# Patient Record
Sex: Male | Born: 1975 | Race: White | Hispanic: No | Marital: Married | Smoking: Current every day smoker
Health system: Southern US, Community
[De-identification: ages and names within clinical notes are randomized; demographics above are authoritative.]

## PROBLEM LIST (undated history)

## (undated) DIAGNOSIS — Z8719 Personal history of other diseases of the digestive system: Secondary | ICD-10-CM

## (undated) DIAGNOSIS — E119 Type 2 diabetes mellitus without complications: Secondary | ICD-10-CM

## (undated) HISTORY — PX: FACIAL FRACTURE SURGERY: SHX1570

---

## 2003-03-25 ENCOUNTER — Other Ambulatory Visit: Payer: Self-pay

## 2004-02-27 ENCOUNTER — Emergency Department: Payer: Self-pay | Admitting: Emergency Medicine

## 2005-01-30 ENCOUNTER — Emergency Department: Payer: Self-pay | Admitting: Internal Medicine

## 2005-06-18 ENCOUNTER — Emergency Department: Payer: Self-pay | Admitting: Unknown Physician Specialty

## 2005-07-01 ENCOUNTER — Ambulatory Visit: Payer: Self-pay | Admitting: Otolaryngology

## 2005-07-24 ENCOUNTER — Ambulatory Visit: Payer: Self-pay | Admitting: Otolaryngology

## 2006-06-25 ENCOUNTER — Emergency Department: Payer: Self-pay | Admitting: Emergency Medicine

## 2007-05-18 ENCOUNTER — Emergency Department: Payer: Self-pay | Admitting: Emergency Medicine

## 2008-08-27 ENCOUNTER — Emergency Department: Payer: Self-pay | Admitting: Emergency Medicine

## 2008-10-12 ENCOUNTER — Emergency Department: Payer: Self-pay | Admitting: Emergency Medicine

## 2009-04-30 ENCOUNTER — Emergency Department: Payer: Self-pay | Admitting: Emergency Medicine

## 2010-01-27 ENCOUNTER — Emergency Department: Payer: Self-pay | Admitting: Emergency Medicine

## 2011-01-04 ENCOUNTER — Emergency Department: Payer: Self-pay | Admitting: Emergency Medicine

## 2011-01-15 ENCOUNTER — Ambulatory Visit (INDEPENDENT_AMBULATORY_CARE_PROVIDER_SITE_OTHER): Payer: Self-pay | Admitting: Internal Medicine

## 2011-01-15 ENCOUNTER — Encounter: Payer: Self-pay | Admitting: Internal Medicine

## 2011-01-15 ENCOUNTER — Encounter (INDEPENDENT_AMBULATORY_CARE_PROVIDER_SITE_OTHER): Payer: Self-pay

## 2011-01-15 DIAGNOSIS — R5381 Other malaise: Secondary | ICD-10-CM

## 2011-01-15 DIAGNOSIS — Z0000 Encounter for general adult medical examination without abnormal findings: Secondary | ICD-10-CM

## 2011-01-15 DIAGNOSIS — F17200 Nicotine dependence, unspecified, uncomplicated: Secondary | ICD-10-CM

## 2011-01-15 DIAGNOSIS — R5383 Other fatigue: Secondary | ICD-10-CM

## 2011-01-15 DIAGNOSIS — B351 Tinea unguium: Secondary | ICD-10-CM

## 2011-01-15 NOTE — Progress Notes (Signed)
Subjective:    Patient ID: Calvin Butler, male    DOB: 12/23/1975, 35 y.o.   MRN: [id number]  HPI 35 year old male presents to establish care. He reports that he hasn't seen a physician in several years. He has 2 concerns today. First, he notes some increased fatigue. He reports decreased energy level over the last several months. He also notes decrease in libido. He denies any shortness of breath, chest pain, muscular weakness. He questions whether he may have low testosterone level. He notes that he has been sleeping well. He only sleeps 4-5 hours per night because of job schedule. He denies any recent weight loss, fever, chills, change in bowel habits.  He is also concerned today about lesions on his bilateral great toenails. These have been present for several months. He notes that he wears steel toed boots to work. His nails have appeared thickened and discolored. He has tried cutting the nails back with minimal improvement. He denies any pain in his feet.    No outpatient encounter prescriptions on file as of 01/15/2011.    Review of Systems  Constitutional: Positive for fatigue. Negative for fever, chills, activity change, appetite change and unexpected weight change.  Eyes: Negative for visual disturbance.  Respiratory: Negative for cough and shortness of breath.   Cardiovascular: Negative for chest pain, palpitations and leg swelling.  Gastrointestinal: Negative for abdominal pain and abdominal distention.  Genitourinary: Negative for dysuria, urgency and difficulty urinating.  Musculoskeletal: Negative for arthralgias and gait problem.  Skin: Negative for color change and rash.  Hematological: Negative for adenopathy.  Psychiatric/Behavioral: Negative for sleep disturbance and dysphoric mood. The patient is not nervous/anxious.    BP 138/70  Pulse 75  Temp(Src) 98.2 F (36.8 C) (Oral)  Wt 250 lb (113.399 kg)  SpO2 96%     Objective:   Physical Exam  Constitutional: He  is oriented to person, place, and time. He appears well-developed and well-nourished. No distress.  HENT:  Head: Normocephalic and atraumatic.  Right Ear: External ear normal.  Left Ear: External ear normal.  Nose: Nose normal.  Mouth/Throat: Oropharynx is clear and moist. No oropharyngeal exudate.  Eyes: Conjunctivae and EOM are normal. Pupils are equal, round, and reactive to light. Right eye exhibits no discharge. Left eye exhibits no discharge. No scleral icterus.  Neck: Normal range of motion. Neck supple. No tracheal deviation present. No thyromegaly present.  Cardiovascular: Normal rate, regular rhythm and normal heart sounds.  Exam reveals no gallop and no friction rub.   No murmur heard. Pulmonary/Chest: Effort normal and breath sounds normal. No respiratory distress. He has no wheezes. He has no rales. He exhibits no tenderness.  Abdominal: Soft. Bowel sounds are normal. He exhibits no distension and no mass. There is no tenderness. There is no rebound and no guarding.  Musculoskeletal: Normal range of motion. He exhibits no edema.  Lymphadenopathy:    He has no cervical adenopathy.  Neurological: He is alert and oriented to person, place, and time. No cranial nerve deficit. Coordination normal.  Skin: Skin is warm and dry. No rash noted. He is not diaphoretic. No erythema. No pallor.     Psychiatric: He has a normal mood and affect. His behavior is normal. Judgment and thought content normal.          Assessment & Plan:  1. Fatigue -suspect this is secondary to limited sleep due to work schedule. However, will send basic lab work including CBC, CMP, TSH, and testosterone level. Follow up  1 month.  2. Onchomycosis - Will set up with podiatry for evaluation and filing of nails. Suspect his work boots are leading to chronic trauma to his nails, which is making symptoms worse, question if he might benefit from orthotics.  3. Tobacco abuse - Encouraged smoking cessation.  Discussed use of chantix versus wellbutrin.  He lives in a home with 2 other smokers, so likely will need to get them on board prior to setting a stop date.

## 2011-01-16 ENCOUNTER — Encounter: Payer: Self-pay | Admitting: Internal Medicine

## 2011-01-24 ENCOUNTER — Other Ambulatory Visit (INDEPENDENT_AMBULATORY_CARE_PROVIDER_SITE_OTHER): Payer: BC Managed Care – PPO | Admitting: *Deleted

## 2011-01-24 DIAGNOSIS — Z0000 Encounter for general adult medical examination without abnormal findings: Secondary | ICD-10-CM

## 2011-01-24 DIAGNOSIS — R5383 Other fatigue: Secondary | ICD-10-CM

## 2011-01-24 DIAGNOSIS — R5381 Other malaise: Secondary | ICD-10-CM

## 2011-01-24 LAB — COMPREHENSIVE METABOLIC PANEL
ALT: 28 U/L (ref 0–53)
AST: 21 U/L (ref 0–37)
Albumin: 4.1 g/dL (ref 3.5–5.2)
Alkaline Phosphatase: 61 U/L (ref 39–117)
BUN: 18 mg/dL (ref 6–23)
CO2: 26 mEq/L (ref 19–32)
Calcium: 8.8 mg/dL (ref 8.4–10.5)
Chloride: 108 mEq/L (ref 96–112)
Creatinine, Ser: 1 mg/dL (ref 0.4–1.5)
GFR: 90.78 mL/min (ref >60.00)
Glucose, Bld: 107 mg/dL — ABNORMAL HIGH (ref 70–99)
Potassium: 3.9 mEq/L (ref 3.5–5.1)
Sodium: 141 mEq/L (ref 135–145)
Total Bilirubin: 0.3 mg/dL (ref 0.3–1.2)
Total Protein: 7.2 g/dL (ref 6.0–8.3)

## 2011-01-24 LAB — CBC WITH DIFFERENTIAL/PLATELET
Basophils Absolute: 0.1 10*3/uL (ref 0.0–0.1)
Basophils Relative: 0.5 % (ref 0.0–3.0)
Eosinophils Absolute: 0.3 10*3/uL (ref 0.0–0.7)
Eosinophils Relative: 2.9 % (ref 0.0–5.0)
HCT: 49 % (ref 39.0–52.0)
Hemoglobin: 16.6 g/dL (ref 13.0–17.0)
Lymphocytes Relative: 31.7 % (ref 12.0–46.0)
Lymphs Abs: 3.7 10*3/uL (ref 0.7–4.0)
MCHC: 33.8 g/dL (ref 30.0–36.0)
MCV: 93.4 fl (ref 78.0–100.0)
Monocytes Absolute: 1.3 10*3/uL — ABNORMAL HIGH (ref 0.1–1.0)
Monocytes Relative: 10.9 % (ref 3.0–12.0)
Neutro Abs: 6.3 10*3/uL (ref 1.4–7.7)
Neutrophils Relative %: 54 % (ref 43.0–77.0)
Platelets: 216 10*3/uL (ref 150.0–400.0)
RBC: 5.25 Mil/uL (ref 4.22–5.81)
RDW: 13.4 % (ref 11.5–14.6)
WBC: 11.7 10*3/uL — ABNORMAL HIGH (ref 4.5–10.5)

## 2011-01-24 LAB — LIPID PANEL
Cholesterol: 176 mg/dL (ref 0–200)
HDL: 28.5 mg/dL — ABNORMAL LOW (ref >39.00)
Total CHOL/HDL Ratio: 6
Triglycerides: 368 mg/dL — ABNORMAL HIGH (ref 0.0–149.0)
VLDL: 73.6 mg/dL — ABNORMAL HIGH (ref 0.0–40.0)

## 2011-01-24 LAB — LDL CHOLESTEROL, DIRECT: Direct LDL: 92 mg/dL

## 2011-01-24 LAB — TSH: TSH: 1.89 u[IU]/mL (ref 0.35–5.50)

## 2011-01-27 LAB — TESTOSTERONE, FREE, TOTAL, SHBG
Sex Hormone Binding: 22 nmol/L (ref 13–71)
Testosterone, Free: 125.4 pg/mL (ref 47.0–244.0)
Testosterone-% Free: 2.6 % (ref 1.6–2.9)
Testosterone: 486.57 ng/dL (ref 250–890)

## 2011-02-17 ENCOUNTER — Ambulatory Visit (INDEPENDENT_AMBULATORY_CARE_PROVIDER_SITE_OTHER): Payer: BC Managed Care – PPO | Admitting: Internal Medicine

## 2011-02-17 ENCOUNTER — Encounter: Payer: Self-pay | Admitting: Internal Medicine

## 2011-02-17 VITALS — BP 102/70 | HR 64 | Temp 97.5°F | Ht 67.0 in | Wt 249.0 lb

## 2011-02-17 DIAGNOSIS — R5381 Other malaise: Secondary | ICD-10-CM

## 2011-02-17 DIAGNOSIS — R5383 Other fatigue: Secondary | ICD-10-CM | POA: Insufficient documentation

## 2011-02-17 DIAGNOSIS — H6690 Otitis media, unspecified, unspecified ear: Secondary | ICD-10-CM

## 2011-02-17 DIAGNOSIS — E669 Obesity, unspecified: Secondary | ICD-10-CM

## 2011-02-17 DIAGNOSIS — H6692 Otitis media, unspecified, left ear: Secondary | ICD-10-CM

## 2011-02-17 MED ORDER — AMOXICILLIN-POT CLAVULANATE 875-125 MG PO TABS: 1.0000 | ORAL_TABLET | Freq: Two times a day (BID) | ORAL | Status: AC

## 2011-02-17 MED ORDER — PHENTERMINE HCL 37.5 MG PO CAPS: 37.5000 mg | ORAL_CAPSULE | ORAL | Status: DC

## 2011-02-17 NOTE — Assessment & Plan Note (Signed)
Exam is consistent with left otitis media. Will treat with Augmentin. Patient will call or return to clinic if symptoms are not improving, he will return to clinic in one month for recheck.

## 2011-02-17 NOTE — Progress Notes (Signed)
Subjective:    Patient ID: Calvin Butler, male    DOB: 05/11/1975, 36 y.o.   MRN: [id number]  HPI 36 year old male presents for followup. At his previous visit he was most concerned about fatigue. He reports that this is persistent. Lab work including CBC, CMP, and testosterone level were all normal. On further discussion today, he reports that he is having some nasal congestion. He notes that sometimes he uses Breathe Right strips to help open his nasal passages. He has had difficulty with nasal congestion since he had facial reconstructive surgery. He reports sinus pressure but no ear pain or fever. Questions whether this may be contributing to poor sleep and fatigue.  He is also concerned today about his weight. He notes that he eats very little during the day and then eats a large meal at dinner time. He has not been recording his caloric intake. He does not get regular exercise. However, he is very active in his job. He is interested in losing weight.  Outpatient Encounter Prescriptions as of 02/17/2011  Medication Sig Dispense Refill  . amoxicillin-clavulanate (AUGMENTIN) 875-125 MG per tablet Take 1 tablet by mouth 2 (two) times daily.  20 tablet  0  . phentermine 37.5 MG capsule Take 1 capsule (37.5 mg total) by mouth every morning.  30 capsule  1    Review of Systems  Constitutional: Positive for fatigue. Negative for fever, chills, activity change, appetite change and unexpected weight change.  HENT: Positive for congestion and sinus pressure. Negative for ear pain, rhinorrhea, sneezing, neck pain, postnasal drip and ear discharge.   Eyes: Negative for visual disturbance.  Respiratory: Negative for cough and shortness of breath.   Cardiovascular: Negative for chest pain, palpitations and leg swelling.  Gastrointestinal: Negative for abdominal pain and abdominal distention.  Genitourinary: Negative for dysuria, urgency and difficulty urinating.  Musculoskeletal: Negative for  arthralgias and gait problem.  Skin: Negative for color change and rash.  Hematological: Negative for adenopathy.  Psychiatric/Behavioral: Negative for sleep disturbance and dysphoric mood. The patient is not nervous/anxious.    BP 102/70  Pulse 64  Temp(Src) 97.5 F (36.4 C) (Oral)  Ht 5\' 7"  (1.702 m)  Wt 249 lb (112.946 kg)  BMI 39.00 kg/m2  SpO2 96%     Objective:   Physical Exam  Constitutional: He is oriented to person, place, and time. He appears well-developed and well-nourished. No distress.  HENT:  Head: Normocephalic and atraumatic.  Right Ear: External ear normal. Tympanic membrane is not erythematous and not bulging. A middle ear effusion is present.  Left Ear: External ear normal. Tympanic membrane is erythematous and bulging. A middle ear effusion is present.  Nose: Nose normal.  Mouth/Throat: Oropharynx is clear and moist. No oropharyngeal exudate.  Eyes: Conjunctivae and EOM are normal. Pupils are equal, round, and reactive to light. Right eye exhibits no discharge. Left eye exhibits no discharge. No scleral icterus.  Neck: Normal range of motion. Neck supple. No tracheal deviation present. No thyromegaly present.  Cardiovascular: Normal rate, regular rhythm and normal heart sounds.  Exam reveals no gallop and no friction rub.   No murmur heard. Pulmonary/Chest: Effort normal and breath sounds normal. No respiratory distress. He has no wheezes. He has no rales. He exhibits no tenderness.  Musculoskeletal: Normal range of motion. He exhibits no edema.  Lymphadenopathy:    He has no cervical adenopathy.  Neurological: He is alert and oriented to person, place, and time. No cranial nerve deficit. Coordination normal.  Skin:  Skin is warm and dry. No rash noted. He is not diaphoretic. No erythema. No pallor.  Psychiatric: He has a normal mood and affect. His behavior is normal. Judgment and thought content normal.          Assessment & Plan:

## 2011-02-17 NOTE — Assessment & Plan Note (Signed)
BMI 39. We discussed caloric restriction including limiting calories and keeping a food diary. He will do this. We will also start phentermine to help with appetite suppression. We discussed potential risk including palpitations and elevated blood pressure. He will call if any complications develop. Otherwise, he will plan to followup in one month.

## 2011-02-17 NOTE — Assessment & Plan Note (Signed)
Lab work was unrevealing as to cause. Question if he may have sleep apnea given history of sinus surgery. Will treat his ear infection and then repeat visit to see if symptoms are improving. If not, would favor getting a sleep study.

## 2011-03-20 ENCOUNTER — Encounter: Payer: Self-pay | Admitting: Internal Medicine

## 2011-03-20 ENCOUNTER — Ambulatory Visit (INDEPENDENT_AMBULATORY_CARE_PROVIDER_SITE_OTHER): Payer: BC Managed Care – PPO | Admitting: Internal Medicine

## 2011-03-20 VITALS — BP 118/68 | HR 91 | Temp 97.3°F | Ht 71.0 in | Wt 234.0 lb

## 2011-03-20 DIAGNOSIS — R5381 Other malaise: Secondary | ICD-10-CM

## 2011-03-20 DIAGNOSIS — E669 Obesity, unspecified: Secondary | ICD-10-CM

## 2011-03-20 NOTE — Assessment & Plan Note (Signed)
Marked improvement in diet with increased intake of fruits and vegetables.  Has joined gym to start exercise program.  Tolerating Phentermine well. Has lost 15lb.  Encouraged continued efforts. Follow up 1 month.

## 2011-03-20 NOTE — Progress Notes (Signed)
  Subjective:    Patient ID: Calvin Butler, male    DOB: April 16, 1975, 36 y.o.   MRN: [id number]  HPI 36YO male with h/o obesity and fatigue presents for follow up. He is doing very well. Has changed diet and has increased intake of fruits and vegetables. He is eating more throughout the day, and smaller portions at night. He has joined the [employer] and plans to start exercising today.  Plans to do cardio including running.  He has been taking Phentermine and denies any side effects from this medication.  He denies any new complaints or concerns today.  Outpatient Encounter Prescriptions as of 03/20/2011  Medication Sig Dispense Refill  . phentermine 37.5 MG capsule Take 1 capsule (37.5 mg total) by mouth every morning.  30 capsule  1    Review of Systems  Constitutional: Negative for fever, chills, activity change, appetite change, fatigue and unexpected weight change.  Eyes: Negative for visual disturbance.  Respiratory: Negative for cough and shortness of breath.   Cardiovascular: Negative for chest pain, palpitations and leg swelling.  Gastrointestinal: Negative for abdominal pain and abdominal distention.  Genitourinary: Negative for dysuria, urgency and difficulty urinating.  Musculoskeletal: Negative for arthralgias and gait problem.  Skin: Negative for color change and rash.  Hematological: Negative for adenopathy.  Psychiatric/Behavioral: Negative for sleep disturbance and dysphoric mood. The patient is not nervous/anxious.    BP 118/68  Pulse 91  Temp(Src) 97.3 F (36.3 C) (Oral)  Ht 5\' 11"  (1.803 m)  Wt 234 lb (106.142 kg)  BMI 32.64 kg/m2  SpO2 98%     Objective:   Physical Exam  Constitutional: He is oriented to person, place, and time. He appears well-developed and well-nourished. No distress.  HENT:  Head: Normocephalic and atraumatic.  Right Ear: External ear normal.  Left Ear: External ear normal.  Nose: Nose normal.  Mouth/Throat: Oropharynx is clear and  moist. No oropharyngeal exudate.  Eyes: Conjunctivae and EOM are normal. Pupils are equal, round, and reactive to light. Right eye exhibits no discharge. Left eye exhibits no discharge. No scleral icterus.  Neck: Normal range of motion. Neck supple. No tracheal deviation present. No thyromegaly present.  Cardiovascular: Normal rate, regular rhythm and normal heart sounds.  Exam reveals no gallop and no friction rub.   No murmur heard. Pulmonary/Chest: Effort normal and breath sounds normal. No respiratory distress. He has no wheezes. He has no rales. He exhibits no tenderness.  Musculoskeletal: Normal range of motion. He exhibits no edema.  Lymphadenopathy:    He has no cervical adenopathy.  Neurological: He is alert and oriented to person, place, and time. No cranial nerve deficit. Coordination normal.  Skin: Skin is warm and dry. No rash noted. He is not diaphoretic. No erythema. No pallor.  Psychiatric: He has a normal mood and affect. His behavior is normal. Judgment and thought content normal.          Assessment & Plan:

## 2011-04-24 ENCOUNTER — Ambulatory Visit (INDEPENDENT_AMBULATORY_CARE_PROVIDER_SITE_OTHER): Payer: BC Managed Care – PPO | Admitting: Internal Medicine

## 2011-04-24 ENCOUNTER — Encounter: Payer: Self-pay | Admitting: Internal Medicine

## 2011-04-24 VITALS — BP 122/62 | HR 95 | Temp 97.9°F | Wt 223.0 lb

## 2011-04-24 DIAGNOSIS — H6691 Otitis media, unspecified, right ear: Secondary | ICD-10-CM | POA: Insufficient documentation

## 2011-04-24 DIAGNOSIS — H6690 Otitis media, unspecified, unspecified ear: Secondary | ICD-10-CM

## 2011-04-24 DIAGNOSIS — J320 Chronic maxillary sinusitis: Secondary | ICD-10-CM

## 2011-04-24 DIAGNOSIS — F17200 Nicotine dependence, unspecified, uncomplicated: Secondary | ICD-10-CM

## 2011-04-24 DIAGNOSIS — E669 Obesity, unspecified: Secondary | ICD-10-CM

## 2011-04-24 MED ORDER — ALBUTEROL SULFATE HFA 108 (90 BASE) MCG/ACT IN AERS: 2.0000 | INHALATION_SPRAY | Freq: Four times a day (QID) | RESPIRATORY_TRACT | Status: DC | PRN

## 2011-04-24 MED ORDER — PHENTERMINE HCL 37.5 MG PO CAPS: 37.5000 mg | ORAL_CAPSULE | ORAL | Status: DC

## 2011-04-24 MED ORDER — AMOXICILLIN-POT CLAVULANATE 875-125 MG PO TABS: 1.0000 | ORAL_TABLET | Freq: Two times a day (BID) | ORAL | Status: AC

## 2011-04-24 NOTE — Assessment & Plan Note (Signed)
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 Will get CT sinuses for evaluation. Treating current infection with Augmentin.

## 2011-04-24 NOTE — Assessment & Plan Note (Signed)
Strongly encouraged smoking cessation. 

## 2011-04-24 NOTE — Assessment & Plan Note (Signed)
Congratulated pt on nearly 25lb weight loss. Encouraged him to continue with efforts at healthy diet and exercise. Will continue phentermine for another 1-3 months. Follow up monthly to check BP/HR on phentermine.

## 2011-04-24 NOTE — Progress Notes (Signed)
Subjective:    Patient ID: Calvin Butler, male    DOB: 06-13-1975, 36 y.o.   MRN: [id number]  HPI 36YO male with h/o obesity presents for follow up. He continues to follow a healthy diet and has been working out at [employer].  He has lost nearly 25lbs.  He has been taking Phentermine for appetite suppression, and denies any side effects from this medication.  He is concerned today about several weeks of right maxillary sinus pressure and ear pain.  He notes occasional cough productive of purulent sputum. He denies fever or chills. He notes h/o right maxillary sinus reconstruction and recurrent pain/infections since that time.  He has not recently seen ENT.  Outpatient Encounter Prescriptions as of 04/24/2011  Medication Sig Dispense Refill  . albuterol (PROVENTIL HFA;VENTOLIN HFA) 108 (90 BASE) MCG/ACT inhaler Inhale 2 puffs into the lungs every 6 (six) hours as needed for wheezing.  1 Inhaler  0  . amoxicillin-clavulanate (AUGMENTIN) 875-125 MG per tablet Take 1 tablet by mouth 2 (two) times daily.  20 tablet  0  . phentermine 37.5 MG capsule Take 1 capsule (37.5 mg total) by mouth every morning.  30 capsule  1  . DISCONTD: phentermine 37.5 MG capsule Take 1 capsule (37.5 mg total) by mouth every morning.  30 capsule  1    Review of Systems  Constitutional: Negative for fever, chills, activity change and fatigue.  HENT: Positive for ear pain, congestion and sinus pressure. Negative for hearing loss, nosebleeds, sore throat, rhinorrhea, sneezing, trouble swallowing, neck pain, neck stiffness, voice change, postnasal drip, tinnitus and ear discharge.   Eyes: Negative for discharge, redness, itching and visual disturbance.  Respiratory: Positive for cough. Negative for chest tightness, shortness of breath, wheezing and stridor.   Cardiovascular: Negative for chest pain and leg swelling.  Musculoskeletal: Negative for myalgias and arthralgias.  Skin: Negative for color change and rash.    Neurological: Negative for dizziness, facial asymmetry and headaches.  Psychiatric/Behavioral: Negative for sleep disturbance.   BP 122/62  Pulse 95  Temp(Src) 97.9 F (36.6 C) (Oral)  Wt 223 lb (101.152 kg)  SpO2 98%     Objective:   Physical Exam  Constitutional: He is oriented to person, place, and time. He appears well-developed and well-nourished. No distress.  HENT:  Head: Normocephalic and atraumatic.  Right Ear: External ear normal. Tympanic membrane is erythematous and bulging. A middle ear effusion is present.  Left Ear: External ear normal. Tympanic membrane is not erythematous and not bulging. A middle ear effusion is present.  Nose: Mucosal edema present. Right sinus exhibits maxillary sinus tenderness and frontal sinus tenderness.  Mouth/Throat: Oropharynx is clear and moist. No oropharyngeal exudate.  Eyes: Conjunctivae and EOM are normal. Pupils are equal, round, and reactive to light. Right eye exhibits no discharge. Left eye exhibits no discharge. No scleral icterus.  Neck: Normal range of motion. Neck supple. No tracheal deviation present. No thyromegaly present.  Cardiovascular: Normal rate, regular rhythm and normal heart sounds.  Exam reveals no gallop and no friction rub.   No murmur heard. Pulmonary/Chest: Effort normal and breath sounds normal. No respiratory distress. He has no wheezes. He has no rales. He exhibits no tenderness.  Musculoskeletal: Normal range of motion. He exhibits no edema.  Lymphadenopathy:    He has no cervical adenopathy.  Neurological: He is alert and oriented to person, place, and time. No cranial nerve deficit. Coordination normal.  Skin: Skin is warm and dry. No rash noted. He  is not diaphoretic. No erythema. No pallor.  Psychiatric: He has a normal mood and affect. His behavior is normal. Judgment and thought content normal.          Assessment & Plan:

## 2011-04-24 NOTE — Assessment & Plan Note (Signed)
Exam c/w right OM. Will treat with augmentin. Follow up 1 month or prn.

## 2011-04-28 ENCOUNTER — Ambulatory Visit: Payer: Self-pay | Admitting: Internal Medicine

## 2011-04-30 ENCOUNTER — Telehealth: Payer: Self-pay | Admitting: *Deleted

## 2011-04-30 NOTE — Telephone Encounter (Signed)
Per Dr. Darrick Huntsman CT that was ordered by Dr. Dan Humphreys showed no sign of infection. Patient has been notified.   Result sent to be scanned.

## 2011-05-05 ENCOUNTER — Encounter: Payer: Self-pay | Admitting: Internal Medicine

## 2011-05-23 ENCOUNTER — Ambulatory Visit (INDEPENDENT_AMBULATORY_CARE_PROVIDER_SITE_OTHER): Payer: BC Managed Care – PPO | Admitting: Internal Medicine

## 2011-05-23 ENCOUNTER — Encounter: Payer: Self-pay | Admitting: Internal Medicine

## 2011-05-23 VITALS — BP 98/62 | HR 74 | Temp 98.1°F | Resp 18 | Wt 212.8 lb

## 2011-05-23 DIAGNOSIS — M545 Low back pain, unspecified: Secondary | ICD-10-CM | POA: Insufficient documentation

## 2011-05-23 DIAGNOSIS — M549 Dorsalgia, unspecified: Secondary | ICD-10-CM

## 2011-05-23 DIAGNOSIS — E785 Hyperlipidemia, unspecified: Secondary | ICD-10-CM | POA: Insufficient documentation

## 2011-05-23 DIAGNOSIS — E669 Obesity, unspecified: Secondary | ICD-10-CM

## 2011-05-23 LAB — COMPREHENSIVE METABOLIC PANEL
ALT: 17 U/L (ref 0–53)
AST: 16 U/L (ref 0–37)
Albumin: 4.1 g/dL (ref 3.5–5.2)
Alkaline Phosphatase: 70 U/L (ref 39–117)
BUN: 18 mg/dL (ref 6–23)
CO2: 23 mEq/L (ref 19–32)
Calcium: 9 mg/dL (ref 8.4–10.5)
Chloride: 109 mEq/L (ref 96–112)
Creatinine, Ser: 0.9 mg/dL (ref 0.4–1.5)
GFR: 105.19 mL/min (ref >60.00)
Glucose, Bld: 92 mg/dL (ref 70–99)
Potassium: 4 mEq/L (ref 3.5–5.1)
Sodium: 141 mEq/L (ref 135–145)
Total Bilirubin: 0.8 mg/dL (ref 0.3–1.2)
Total Protein: 7.1 g/dL (ref 6.0–8.3)

## 2011-05-23 LAB — LIPID PANEL
Cholesterol: 151 mg/dL (ref 0–200)
HDL: 27.9 mg/dL — ABNORMAL LOW (ref >39.00)
Total CHOL/HDL Ratio: 5
Triglycerides: 237 mg/dL — ABNORMAL HIGH (ref 0.0–149.0)
VLDL: 47.4 mg/dL — ABNORMAL HIGH (ref 0.0–40.0)

## 2011-05-23 LAB — LDL CHOLESTEROL, DIRECT: Direct LDL: 95.7 mg/dL

## 2011-05-23 MED ORDER — CYCLOBENZAPRINE HCL 5 MG PO TABS: 5.0000 mg | ORAL_TABLET | Freq: Three times a day (TID) | ORAL | Status: AC | PRN

## 2011-05-23 NOTE — Assessment & Plan Note (Signed)
BMI is 29. Patient has lost nearly 40 pounds since December 2012. He continues to follow a healthy diet and has increased his physical activity. Encouraged him to continue with this. Will continue phentermine as needed to help with appetite suppression. Followup in one month.

## 2011-05-23 NOTE — Assessment & Plan Note (Signed)
Elevated triglycerides noted in the past. Given his weight loss and improvement in diet, will repeat cholesterol levels today.

## 2011-05-23 NOTE — Assessment & Plan Note (Signed)
Secondary to musculoskeletal strain. Will continue ibuprofen 800 mg 3 times daily. He will take this medication with food. He will use Flexeril as needed for muscle spasm more severe pain. He will call or return to clinic if symptoms are not improving.

## 2011-05-23 NOTE — Progress Notes (Signed)
Subjective:    Patient ID: Calvin Butler, male    DOB: 04-05-1975, 36 y.o.   MRN: [id number]  HPI 36 year old male with history of obesity and hyperlipidemia presents for followup. He reports he is doing very well. He continues to monitor his caloric intake and is exercising regularly at a local gym. He occasionally uses phentermine for appetite suppression. Over the last month, he has lost another 11 pounds. He denies any noted side effects from phentermine such as palpitations or insomnia.  He is concerned today as he injured his back yesterday when bending forward. He reports hearing a popping sound and then having some discomfort in his mid back. The pain in his back is worsened with movement. He does not have any weakness in his lower extremities. He has been taking ibuprofen with improvement in his symptoms.  Outpatient Encounter Prescriptions as of 05/23/2011  Medication Sig Dispense Refill  . albuterol (PROVENTIL HFA;VENTOLIN HFA) 108 (90 BASE) MCG/ACT inhaler Inhale 2 puffs into the lungs every 6 (six) hours as needed for wheezing.  1 Inhaler  0  . cholecalciferol (VITAMIN D) 1000 UNITS tablet Take 1,000 Units by mouth daily.      . Cyanocobalamin (VITAMIN B-12) 1000 MCG SUBL Place under the tongue daily.      . OMEGA-3 KRILL OIL PO Take by mouth daily.      . phentermine 37.5 MG capsule Take 1 capsule (37.5 mg total) by mouth every morning.  30 capsule  1  . cyclobenzaprine (FLEXERIL) 5 MG tablet Take 1 tablet (5 mg total) by mouth 3 (three) times daily as needed for muscle spasms.  30 tablet  1    Review of Systems  Constitutional: Negative for fever, chills, activity change, appetite change, fatigue and unexpected weight change.  Eyes: Negative for visual disturbance.  Respiratory: Negative for cough and shortness of breath.   Cardiovascular: Negative for chest pain, palpitations and leg swelling.  Gastrointestinal: Negative for abdominal pain and abdominal distention.    Genitourinary: Negative for dysuria, urgency and difficulty urinating.  Musculoskeletal: Positive for myalgias, back pain and arthralgias. Negative for gait problem.  Skin: Negative for color change and rash.  Hematological: Negative for adenopathy.  Psychiatric/Behavioral: Negative for sleep disturbance and dysphoric mood. The patient is not nervous/anxious.    BP 98/62  Pulse 74  Temp(Src) 98.1 F (36.7 C) (Oral)  Resp 18  Wt 212 lb 12 oz (96.503 kg)  SpO2 96%     Objective:   Physical Exam  Constitutional: He is oriented to person, place, and time. He appears well-developed and well-nourished. No distress.  HENT:  Head: Normocephalic and atraumatic.  Right Ear: External ear normal.  Left Ear: External ear normal.  Nose: Nose normal.  Mouth/Throat: Oropharynx is clear and moist. No oropharyngeal exudate.  Eyes: Conjunctivae and EOM are normal. Pupils are equal, round, and reactive to light. Right eye exhibits no discharge. Left eye exhibits no discharge. No scleral icterus.  Neck: Normal range of motion. Neck supple. No tracheal deviation present. No thyromegaly present.  Cardiovascular: Normal rate, regular rhythm and normal heart sounds.  Exam reveals no gallop and no friction rub.   No murmur heard. Pulmonary/Chest: Effort normal and breath sounds normal. No respiratory distress. He has no wheezes. He has no rales. He exhibits no tenderness.  Musculoskeletal: Normal range of motion. He exhibits no edema.       Thoracic back: He exhibits tenderness, pain and spasm.  Lymphadenopathy:    He has no  cervical adenopathy.  Neurological: He is alert and oriented to person, place, and time. No cranial nerve deficit. Coordination normal.  Skin: Skin is warm and dry. No rash noted. He is not diaphoretic. No erythema. No pallor.  Psychiatric: He has a normal mood and affect. His behavior is normal. Judgment and thought content normal.          Assessment & Plan:

## 2011-06-24 ENCOUNTER — Ambulatory Visit: Payer: BC Managed Care – PPO | Admitting: Internal Medicine

## 2011-06-24 DIAGNOSIS — Z0289 Encounter for other administrative examinations: Secondary | ICD-10-CM

## 2011-12-29 ENCOUNTER — Ambulatory Visit (INDEPENDENT_AMBULATORY_CARE_PROVIDER_SITE_OTHER): Payer: BC Managed Care – PPO | Admitting: Internal Medicine

## 2011-12-29 ENCOUNTER — Encounter: Payer: Self-pay | Admitting: Internal Medicine

## 2011-12-29 VITALS — BP 120/68 | HR 77 | Temp 98.2°F | Resp 16 | Wt 238.8 lb

## 2011-12-29 DIAGNOSIS — E669 Obesity, unspecified: Secondary | ICD-10-CM

## 2011-12-29 DIAGNOSIS — H6692 Otitis media, unspecified, left ear: Secondary | ICD-10-CM

## 2011-12-29 DIAGNOSIS — Z1331 Encounter for screening for depression: Secondary | ICD-10-CM

## 2011-12-29 DIAGNOSIS — H6690 Otitis media, unspecified, unspecified ear: Secondary | ICD-10-CM

## 2011-12-29 DIAGNOSIS — H60399 Other infective otitis externa, unspecified ear: Secondary | ICD-10-CM

## 2011-12-29 DIAGNOSIS — H6093 Unspecified otitis externa, bilateral: Secondary | ICD-10-CM

## 2011-12-29 MED ORDER — ALBUTEROL SULFATE HFA 108 (90 BASE) MCG/ACT IN AERS: 2.0000 | INHALATION_SPRAY | Freq: Four times a day (QID) | RESPIRATORY_TRACT | Status: DC | PRN

## 2011-12-29 MED ORDER — PHENTERMINE HCL 37.5 MG PO CAPS: 37.5000 mg | ORAL_CAPSULE | ORAL | Status: DC

## 2011-12-29 MED ORDER — CIPROFLOXACIN-DEXAMETHASONE 0.3-0.1 % OT SUSP: 4.0000 [drp] | Freq: Two times a day (BID) | OTIC | Status: DC

## 2011-12-29 MED ORDER — AMOXICILLIN-POT CLAVULANATE 875-125 MG PO TABS: 1.0000 | ORAL_TABLET | Freq: Two times a day (BID) | ORAL | Status: DC
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
## 2011-12-29 NOTE — Assessment & Plan Note (Signed)
Exam is consistent with bilateral otitis externa. Will treat with Ciprodex. Patient will call if symptoms are not improving.

## 2011-12-29 NOTE — Assessment & Plan Note (Signed)
BMI 33. Patient has regained most of the weight that he lost last year. Given that he did so well with use of phentermine for appetite suppression, will try resuming this medication. He will work on caloric restriction and increase physical activity. Followup in one month.

## 2011-12-29 NOTE — Assessment & Plan Note (Signed)
Exam is consistent with left otitis media. Will treat with Augmentin. Patient will call his symptoms are not improving over the next 72 hours. Otherwise, he will followup in one month for recheck.

## 2012-01-20 ENCOUNTER — Encounter: Payer: Self-pay | Admitting: Adult Health

## 2012-01-20 ENCOUNTER — Ambulatory Visit (INDEPENDENT_AMBULATORY_CARE_PROVIDER_SITE_OTHER): Payer: BC Managed Care – PPO | Admitting: Adult Health

## 2012-01-20 VITALS — BP 127/70 | HR 84 | Temp 98.5°F | Ht 67.0 in | Wt 243.0 lb

## 2012-01-20 DIAGNOSIS — R070 Pain in throat: Secondary | ICD-10-CM

## 2012-01-20 DIAGNOSIS — J020 Streptococcal pharyngitis: Secondary | ICD-10-CM

## 2012-01-20 LAB — POCT RAPID STREP A (OFFICE): Rapid Strep A Screen: POSITIVE — AB

## 2012-01-20 MED ORDER — AZITHROMYCIN 250 MG PO TABS: ORAL_TABLET | ORAL | Status: DC

## 2012-01-20 NOTE — Patient Instructions (Addendum)
  Start your antibiotic today. You can continue to use your ear drops for pain as needed.  Take tylenol 650 mg every 6 hours as needed for general aches and pains. Do not exceed 4000 mg in a day.  You can use salt water gargles for your throat.  You can also use chloraseptic spray for your sore throat.  Please call if you do not feel any improvement within the next 2-3 days.
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
## 2012-01-20 NOTE — Assessment & Plan Note (Signed)
Positive rapid strep. Start azithromycin. Salt water gargles. Chloraseptic throat spray as needed. Tylenol for general aches/pains. Provided patient with a letter to return to work on Thursday.

## 2012-01-30 ENCOUNTER — Ambulatory Visit: Payer: BC Managed Care – PPO | Admitting: Internal Medicine

## 2012-02-04 ENCOUNTER — Ambulatory Visit: Payer: BC Managed Care – PPO | Admitting: Internal Medicine

## 2012-02-11 ENCOUNTER — Ambulatory Visit (INDEPENDENT_AMBULATORY_CARE_PROVIDER_SITE_OTHER): Payer: BC Managed Care – PPO | Admitting: Internal Medicine

## 2012-02-11 ENCOUNTER — Encounter: Payer: Self-pay | Admitting: Internal Medicine

## 2012-02-11 VITALS — BP 118/76 | HR 74 | Temp 98.1°F | Ht 67.0 in | Wt 245.2 lb

## 2012-02-11 DIAGNOSIS — H6690 Otitis media, unspecified, unspecified ear: Secondary | ICD-10-CM

## 2012-02-11 DIAGNOSIS — H60399 Other infective otitis externa, unspecified ear: Secondary | ICD-10-CM

## 2012-02-11 DIAGNOSIS — H6090 Unspecified otitis externa, unspecified ear: Secondary | ICD-10-CM

## 2012-02-11 DIAGNOSIS — H6693 Otitis media, unspecified, bilateral: Secondary | ICD-10-CM | POA: Insufficient documentation

## 2012-02-11 DIAGNOSIS — E669 Obesity, unspecified: Secondary | ICD-10-CM

## 2012-02-11 MED ORDER — CIPROFLOXACIN-DEXAMETHASONE 0.3-0.1 % OT SUSP: 4.0000 [drp] | Freq: Two times a day (BID) | OTIC | Status: DC

## 2012-02-11 MED ORDER — AMOXICILLIN-POT CLAVULANATE 875-125 MG PO TABS: 1.0000 | ORAL_TABLET | Freq: Two times a day (BID) | ORAL | Status: DC

## 2012-02-11 NOTE — Assessment & Plan Note (Signed)
Symptoms and exam consistent with otitis externa. Will treat with topical ciprodex. Encouraged him not to swim until infection has cleared. RTC for recheck in 3 weeks and prn.

## 2012-02-11 NOTE — Progress Notes (Signed)
Subjective:    Patient ID: Calvin Butler, male    DOB: [birth year], 36 y.o.   MRN: [id number]  HPI 37 year old male with history of obesity presents for acute visit complaining of bilateral ear pain. At his last visit on 01/20/2012, he was diagnosed with strep throat and bilateral ear infections and treated with azithromycin. He reports that symptoms of sore throat resolved however he continued to have bilateral ear pain. He denies any fever or chills. He denies drainage from his years. He reports that his ear canals are frequently itchy. He notes that he had perforated ear drum on the left as a child.  Outpatient Encounter Prescriptions as of 02/11/2012  Medication Sig Dispense Refill  . albuterol (PROVENTIL HFA;VENTOLIN HFA) 108 (90 BASE) MCG/ACT inhaler Inhale 2 puffs into the lungs every 6 (six) hours as needed for wheezing.  1 Inhaler  6  . phentermine 37.5 MG capsule Take 1 capsule (37.5 mg total) by mouth every morning.  30 capsule  1  . amoxicillin-clavulanate (AUGMENTIN) 875-125 MG per tablet Take 1 tablet by mouth 2 (two) times daily.  20 tablet  0  . cholecalciferol (VITAMIN D) 1000 UNITS tablet Take 1,000 Units by mouth daily.      . ciprofloxacin-dexamethasone (CIPRODEX) otic suspension Place 4 drops into both ears 2 (two) times daily.  7.5 mL  0  . Cyanocobalamin (VITAMIN B-12) 1000 MCG SUBL Place under the tongue daily.      . OMEGA-3 KRILL OIL PO Take by mouth daily.      . [DISCONTINUED] azithromycin (ZITHROMAX) 250 MG tablet Take 2 tablets on the first day. Then take 1 tablet daily for the next 4 days.  6 tablet  0   BP 118/76  Pulse 74  Temp 98.1 F (36.7 C) (Oral)  Ht 5\' 7"  (1.702 m)  Wt 245 lb 4 oz (111.245 kg)  BMI 38.41 kg/m2  SpO2 96%  Review of Systems  Constitutional: Negative for fever, chills, activity change, appetite change, fatigue and unexpected weight change.  HENT: Positive for ear pain. Negative for hearing loss, congestion, sore throat, rhinorrhea,  postnasal drip, sinus pressure and ear discharge.   Eyes: Negative for visual disturbance.  Respiratory: Negative for cough and shortness of breath.   Cardiovascular: Negative for chest pain, palpitations and leg swelling.  Gastrointestinal: Negative for abdominal pain and abdominal distention.  Genitourinary: Negative for dysuria, urgency and difficulty urinating.  Musculoskeletal: Negative for arthralgias and gait problem.  Skin: Negative for color change and rash.  Hematological: Negative for adenopathy.  Psychiatric/Behavioral: Negative for sleep disturbance and dysphoric mood. The patient is not nervous/anxious.        Objective:   Physical Exam  Constitutional: He is oriented to person, place, and time. He appears well-developed and well-nourished. No distress.  HENT:  Head: Normocephalic and atraumatic.  Right Ear: External ear normal. Tympanic membrane is erythematous and bulging. A middle ear effusion is present.  Left Ear: External ear normal. Tympanic membrane is bulging. Tympanic membrane is not erythematous. A middle ear effusion is present.  Nose: Nose normal.  Mouth/Throat: Oropharynx is clear and moist. No oropharyngeal exudate.       Bilateral ear canals erythematous with purulent exudate.  Eyes: Conjunctivae normal and EOM are normal. Pupils are equal, round, and reactive to light. Right eye exhibits no discharge. Left eye exhibits no discharge. No scleral icterus.  Neck: Normal range of motion. Neck supple. No tracheal deviation present. No thyromegaly present.  Cardiovascular: Normal rate, regular  rhythm and normal heart sounds.  Exam reveals no gallop and no friction rub.   No murmur heard. Pulmonary/Chest: Effort normal and breath sounds normal. No respiratory distress. He has no wheezes. He has no rales. He exhibits no tenderness.  Musculoskeletal: Normal range of motion. He exhibits no edema.  Lymphadenopathy:    He has no cervical adenopathy.  Neurological: He  is alert and oriented to person, place, and time. No cranial nerve deficit. Coordination normal.  Skin: Skin is warm and dry. No rash noted. He is not diaphoretic. No erythema. No pallor.  Psychiatric: He has a normal mood and affect. His behavior is normal. Judgment and thought content normal.          Assessment & Plan:

## 2012-02-11 NOTE — Assessment & Plan Note (Signed)
Body mass index is 38.41 kg/(m^2).  Patient has gained weight over the holidays. Will restart phentermine. He will work on caloric restriction and increase physical activity. Followup in 3-4 weeks.

## 2012-02-11 NOTE — Assessment & Plan Note (Signed)
Exam is consistent with acute otitis media bilaterally. No improvement with azithromycin. Will start Augmentin. Patient will return to clinic in 3 weeks for recheck or sooner as needed. If no improvement, would favor referral to ENT for evaluation.

## 2012-02-25 ENCOUNTER — Encounter: Payer: Self-pay | Admitting: Adult Health

## 2012-02-25 ENCOUNTER — Ambulatory Visit (INDEPENDENT_AMBULATORY_CARE_PROVIDER_SITE_OTHER): Payer: BC Managed Care – PPO | Admitting: Adult Health

## 2012-02-25 VITALS — BP 100/60 | HR 72 | Temp 98.4°F | Resp 18 | Wt 239.0 lb

## 2012-02-25 DIAGNOSIS — J40 Bronchitis, not specified as acute or chronic: Secondary | ICD-10-CM

## 2012-02-25 DIAGNOSIS — R6889 Other general symptoms and signs: Secondary | ICD-10-CM

## 2012-02-25 DIAGNOSIS — R109 Unspecified abdominal pain: Secondary | ICD-10-CM | POA: Insufficient documentation

## 2012-02-25 DIAGNOSIS — J111 Influenza due to unidentified influenza virus with other respiratory manifestations: Secondary | ICD-10-CM

## 2012-02-25 LAB — POCT URINALYSIS DIPSTICK
Bilirubin, UA: NEGATIVE
Blood, UA: NEGATIVE
Glucose, UA: NEGATIVE
Ketones, UA: NEGATIVE
Leukocytes, UA: NEGATIVE
Nitrite, UA: NEGATIVE
Protein, UA: NEGATIVE
Spec Grav, UA: 1.025
Urobilinogen, UA: 0.2
pH, UA: 5

## 2012-02-25 LAB — POCT INFLUENZA A/B
Influenza A, POC: NEGATIVE
Influenza B, POC: NEGATIVE

## 2012-02-25 MED ORDER — TRAMADOL HCL 50 MG PO TABS
50.0000 mg | ORAL_TABLET | Freq: Three times a day (TID) | ORAL | Status: DC | PRN
Start: 2012-02-25 — End: 2012-04-23

## 2012-02-25 MED ORDER — PREDNISONE 10 MG PO TABS: ORAL_TABLET | ORAL | Status: DC

## 2012-02-25 MED ORDER — LEVOFLOXACIN 500 MG PO TABS: 500.0000 mg | ORAL_TABLET | Freq: Every day | ORAL | Status: DC

## 2012-02-25 NOTE — Assessment & Plan Note (Signed)
Reports having some dysuria. UA dip ordered.

## 2012-02-25 NOTE — Assessment & Plan Note (Addendum)
Start Levaquin x 10 days. Albuterol inh q 6h x 3 days then prn. Prednisone taper. RTC if no improvement in 3-4 days. Short-term Tramadol for back pain caused by coughing and not relieved with ibuprofen or tylenol.

## 2012-02-25 NOTE — Progress Notes (Signed)
  Subjective:    Patient ID: Calvin Butler, male    DOB: [birth year], 37 y.o.   MRN: [id number]  HPI  Patient presents to clinic with c/o coughing, wheezing and "feeling like I was hit by a truck". Patient was seen in clinic around new years with strep throat and started on antibiotic. He later reports that he got another respiratory/ear infection and was given augmentin. This is his third illness in 4-5 weeks. He has been taking some NyQuil but he reports this doesn't help him much. Symptoms began on Saturday but yesterday they became worse.   Current Outpatient Prescriptions on File Prior to Visit  Medication Sig Dispense Refill  . albuterol (PROVENTIL HFA;VENTOLIN HFA) 108 (90 BASE) MCG/ACT inhaler Inhale 2 puffs into the lungs every 6 (six) hours as needed for wheezing.  1 Inhaler  6  . phentermine 37.5 MG capsule Take 1 capsule (37.5 mg total) by mouth every morning.  30 capsule  1  . cholecalciferol (VITAMIN D) 1000 UNITS tablet Take 1,000 Units by mouth daily.      . Cyanocobalamin (VITAMIN B-12) 1000 MCG SUBL Place under the tongue daily.      . OMEGA-3 KRILL OIL PO Take by mouth daily.         Review of Systems  HENT: Positive for congestion, rhinorrhea and postnasal drip. Negative for sore throat.   Respiratory: Positive for cough and wheezing. Negative for shortness of breath.   Cardiovascular: Negative for chest pain.  Gastrointestinal: Negative.   Genitourinary: Positive for dysuria and flank pain.  Musculoskeletal: Positive for back pain.  Neurological: Negative.   Psychiatric/Behavioral: Negative.     BP 100/60  Pulse 72  Temp 98.4 F (36.9 C) (Oral)  Resp 18  Wt 239 lb (108.41 kg)  SpO2 97%     Objective:   Physical Exam  Constitutional: He is oriented to person, place, and time. He appears well-developed and well-nourished.       Acute illness. Appears uncomfortable.  HENT:  Head: Normocephalic and atraumatic.  Mouth/Throat: No oropharyngeal exudate.    Pharyngeal erythema without exudate.  Cardiovascular: Normal rate and regular rhythm.  Exam reveals no gallop.   No murmur heard. Pulmonary/Chest: No respiratory distress. He has wheezes.  Abdominal: Soft. Bowel sounds are normal.  Neurological: He is alert and oriented to person, place, and time.  Skin: Skin is warm and dry.  Psychiatric: He has a normal mood and affect. His behavior is normal. Judgment and thought content normal.          Assessment & Plan:

## 2012-02-25 NOTE — Patient Instructions (Addendum)
  Start your levaquin (antibiotic) today.  Use the albuterol inhaler every 6 hours during the day for the next 3 days. Then go back to using this as needed.  Start your prednisone taper. You will take 60 mg on the first day then you will decrease by 10mg  daily. Take this until they are done. This is a powerful anti-inflammatory.  Tramadol for your pain. 1 pill every 8 hours as needed.

## 2012-02-26 ENCOUNTER — Telehealth: Payer: Self-pay | Admitting: Internal Medicine

## 2012-02-26 NOTE — Telephone Encounter (Signed)
Patient calling to see if new inhaler had been called to his pharmacy. Advised that i would forward message.  He reports that the inhaler is going to cost $75.00 not covered by his insurance. Please notify [phone]

## 2012-02-26 NOTE — Telephone Encounter (Signed)
Needing a less expensive inhaler the one that was prescribed was 75.00.

## 2012-02-26 NOTE — Telephone Encounter (Signed)
He will need to let us know which inhaler is better covered by his formulary.

## 2012-02-27 NOTE — Telephone Encounter (Signed)
Called patient and informed him to call his insurance company or ask the pharmacist which inhaler is covered on his formulary. He verbally agreed he understood.

## 2012-03-05 ENCOUNTER — Ambulatory Visit: Payer: BC Managed Care – PPO | Admitting: Internal Medicine

## 2012-03-06 ENCOUNTER — Other Ambulatory Visit: Payer: Self-pay

## 2012-03-17 ENCOUNTER — Ambulatory Visit (INDEPENDENT_AMBULATORY_CARE_PROVIDER_SITE_OTHER): Payer: BC Managed Care – PPO | Admitting: Internal Medicine

## 2012-03-17 ENCOUNTER — Encounter: Payer: Self-pay | Admitting: Internal Medicine

## 2012-03-17 DIAGNOSIS — E669 Obesity, unspecified: Secondary | ICD-10-CM

## 2012-03-17 DIAGNOSIS — H6690 Otitis media, unspecified, unspecified ear: Secondary | ICD-10-CM | POA: Insufficient documentation

## 2012-03-17 DIAGNOSIS — J329 Chronic sinusitis, unspecified: Secondary | ICD-10-CM | POA: Insufficient documentation

## 2012-03-17 MED ORDER — PHENTERMINE HCL 37.5 MG PO CAPS: 37.5000 mg | ORAL_CAPSULE | ORAL | Status: DC

## 2012-03-17 MED ORDER — SULFAMETHOXAZOLE-TMP DS 800-160 MG PO TABS: 1.0000 | ORAL_TABLET | Freq: Two times a day (BID) | ORAL | Status: DC

## 2012-03-17 NOTE — Assessment & Plan Note (Signed)
Patient with chronic otitis media and maxillary sinusitis. Minimal improvement after azithromycin, Augmentin, Levaquin. 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 Will set up ENT evaluation. Question if he has obstruction leading to chronic infection.

## 2012-03-17 NOTE — Assessment & Plan Note (Signed)
Body mass index is 38.05 kg/(m^2).  Patient is dedicated to making effort at weight loss. He would like to restart phentermine. Will restart phentermine 37.5 mg daily. Goal weight loss 0.5-1 pound per week. He will limit caloric intake and keep a food diary. Goal exercise 30 minutes 5 times per week. Followup one month.

## 2012-03-17 NOTE — Progress Notes (Signed)
Subjective:    Patient ID: Calvin Butler, male    DOB: [birth year], 37 y.o.   MRN: [id number]  HPI 37 year old male with history of chronic ear and sinus infections presents for followup. Over the last several months, he has been treated with Augmentin, azithromycin, and Levaquin for bilateral ear infections and bilateral maxillary sinusitis. He reports continued congestion and occasional ear pain. He denies any fever or chills. He notes he has cut down on smoking to 3 cigarettes per day.   He is also concerned about his weight. He is committed to improving his diet. He is committed to keeping a food diary. He has started exercising again. He would like to start phentermine to help with appetite suppression.  Outpatient Encounter Prescriptions as of 03/17/2012  Medication Sig Dispense Refill  . albuterol (PROVENTIL HFA;VENTOLIN HFA) 108 (90 BASE) MCG/ACT inhaler Inhale 2 puffs into the lungs every 6 (six) hours as needed for wheezing.  1 Inhaler  6  . cholecalciferol (VITAMIN D) 1000 UNITS tablet Take 1,000 Units by mouth daily.      . Cyanocobalamin (VITAMIN B-12) 1000 MCG SUBL Place under the tongue daily.      . OMEGA-3 KRILL OIL PO Take by mouth daily.      . phentermine 37.5 MG capsule Take 1 capsule (37.5 mg total) by mouth every morning.  30 capsule  1  . traMADol (ULTRAM) 50 MG tablet Take 1 tablet (50 mg total) by mouth every 8 (eight) hours as needed for pain.  30 tablet  0   No facility-administered encounter medications on file as of 03/17/2012.   BP 120/78  Pulse 80  Temp(Src) 97.9 F (36.6 C) (Oral)  Wt 243 lb (110.224 kg)  BMI 38.05 kg/m2  SpO2 97%  Review of Systems  Constitutional: Negative for fever, chills, activity change, appetite change, fatigue and unexpected weight change.  HENT: Positive for ear pain, congestion and sinus pressure. Negative for rhinorrhea, sneezing, neck pain and postnasal drip.   Eyes: Negative for visual disturbance.  Respiratory: Negative  for cough and shortness of breath.   Cardiovascular: Negative for chest pain, palpitations and leg swelling.  Gastrointestinal: Negative for abdominal pain and abdominal distention.  Genitourinary: Negative for dysuria, urgency and difficulty urinating.  Musculoskeletal: Negative for arthralgias and gait problem.  Skin: Negative for color change and rash.  Hematological: Negative for adenopathy.  Psychiatric/Behavioral: Negative for sleep disturbance and dysphoric mood. The patient is not nervous/anxious.        Objective:   Physical Exam  Constitutional: He is oriented to person, place, and time. He appears well-developed and well-nourished. No distress.  HENT:  Head: Normocephalic and atraumatic.  Right Ear: External ear normal. Tympanic membrane is erythematous and bulging. A middle ear effusion is present.  Left Ear: External ear normal. Tympanic membrane is erythematous and bulging. A middle ear effusion is present.  Nose: Mucosal edema present. Right sinus exhibits maxillary sinus tenderness. Left sinus exhibits maxillary sinus tenderness.  Mouth/Throat: Oropharynx is clear and moist. No oropharyngeal exudate.  Eyes: Conjunctivae and EOM are normal. Pupils are equal, round, and reactive to light. Right eye exhibits no discharge. Left eye exhibits no discharge. No scleral icterus.  Neck: Normal range of motion. Neck supple. No tracheal deviation present. No thyromegaly present.  Cardiovascular: Normal rate, regular rhythm and normal heart sounds.  Exam reveals no gallop and no friction rub.   No murmur heard. Pulmonary/Chest: Effort normal and breath sounds normal. No respiratory distress. He has no  wheezes. He has no rales. He exhibits no tenderness.  Musculoskeletal: Normal range of motion. He exhibits no edema.  Lymphadenopathy:    He has no cervical adenopathy.  Neurological: He is alert and oriented to person, place, and time. No cranial nerve deficit. Coordination normal.   Skin: Skin is warm and dry. No rash noted. He is not diaphoretic. No erythema. No pallor.  Psychiatric: He has a normal mood and affect. His behavior is normal. Judgment and thought content normal.          Assessment & Plan:

## 2012-04-21 ENCOUNTER — Ambulatory Visit: Payer: BC Managed Care – PPO | Admitting: Internal Medicine

## 2012-04-23 ENCOUNTER — Ambulatory Visit (INDEPENDENT_AMBULATORY_CARE_PROVIDER_SITE_OTHER): Payer: BC Managed Care – PPO | Admitting: Internal Medicine

## 2012-04-23 ENCOUNTER — Encounter: Payer: Self-pay | Admitting: Internal Medicine

## 2012-04-23 ENCOUNTER — Telehealth: Payer: Self-pay | Admitting: Internal Medicine

## 2012-04-23 DIAGNOSIS — H6690 Otitis media, unspecified, unspecified ear: Secondary | ICD-10-CM

## 2012-04-23 DIAGNOSIS — E669 Obesity, unspecified: Secondary | ICD-10-CM

## 2012-04-23 MED ORDER — AZELASTINE-FLUTICASONE 137-50 MCG/ACT NA SUSP: 1.0000 | Freq: Two times a day (BID) | NASAL | Status: DC

## 2012-04-23 MED ORDER — CIPROFLOXACIN-DEXAMETHASONE 0.3-0.1 % OT SUSP: 4.0000 [drp] | Freq: Two times a day (BID) | OTIC | Status: DC

## 2012-04-23 MED ORDER — PHENTERMINE HCL 37.5 MG PO CAPS: 37.5000 mg | ORAL_CAPSULE | ORAL | Status: DC

## 2012-04-23 MED ORDER — NEOMYCIN-POLYMYXIN-HC 3.5-10000-1 OT SOLN: 3.0000 [drp] | Freq: Four times a day (QID) | OTIC | Status: DC

## 2012-04-23 NOTE — Telephone Encounter (Signed)
OK. I will try calling in an alternative medication. Please have him let us know if very expensive.

## 2012-04-23 NOTE — Telephone Encounter (Signed)
Fwd to Dr. Walker, please read below 

## 2012-04-23 NOTE — Telephone Encounter (Signed)
The prescription for the patient is 150.00 needing something less expensive.  ciprofloxacin-dexamethasone (CIPRODEX) otic suspension

## 2012-04-23 NOTE — Progress Notes (Signed)
Subjective:    Patient ID: Calvin Butler, male    DOB: 1975-07-08, 37 y.o.   MRN: [id number]  HPI 37 year old male with history of chronic sinus and ear infections, tobacco abuse, and obesity presents for followup. At his last visit, he was noted to have recurrent bilateral ear infections. He was treated with Bactrim. He was evaluated by ENT after starting treatment and, reportedly, they felt that infection had resolved. Antibiotics were stopped.  He notes recurrent symptoms of bilateral ear pain and pressure. He denies any fever or chills. He has chronic nasal congestion and sinus pressure. He is not currently taking anything for this. He notes the ENT physician had written for nasal spray and ear drops but the medications were too expensive to fill.  In regards to obesity, patient reports he has been following a healthy diet and increasing physical activity. He has been using phentermine for appetite suppression. He denies any side effects from this medication.  Outpatient Encounter Prescriptions as of 04/23/2012  Medication Sig Dispense Refill  . albuterol (PROVENTIL HFA;VENTOLIN HFA) 108 (90 BASE) MCG/ACT inhaler Inhale 2 puffs into the lungs every 6 (six) hours as needed for wheezing.  1 Inhaler  6  . cholecalciferol (VITAMIN D) 1000 UNITS tablet Take 1,000 Units by mouth daily.      . Cyanocobalamin (VITAMIN B-12) 1000 MCG SUBL Place under the tongue daily.      Marland Kitchen ibuprofen (ADVIL,MOTRIN) 200 MG tablet Take 200 mg by mouth every 6 (six) hours as needed for pain.      Marland Kitchen OMEGA-3 KRILL OIL PO Take by mouth daily.      . phentermine 37.5 MG capsule Take 1 capsule (37.5 mg total) by mouth every morning.  30 capsule  1  . [DISCONTINUED] phentermine 37.5 MG capsule Take 1 capsule (37.5 mg total) by mouth every morning.  30 capsule  1  . Azelastine-Fluticasone 137-50 MCG/ACT SUSP Place 1 spray into the nose 2 (two) times daily.  23 g  1  . ciprofloxacin-dexamethasone (CIPRODEX) otic suspension Place  4 drops into both ears 2 (two) times daily.  7.5 mL  0   No facility-administered encounter medications on file as of 04/23/2012.   BP 112/80  Pulse 97  Temp(Src) 97.6 F (36.4 C) (Oral)  Wt 239 lb (108.41 kg)  BMI 37.42 kg/m2  SpO2 97%  Review of Systems  Constitutional: Negative for fever, chills, activity change, appetite change, fatigue and unexpected weight change.  HENT: Positive for ear pain, congestion and sinus pressure. Negative for hearing loss and ear discharge.   Eyes: Negative for visual disturbance.  Respiratory: Negative for cough and shortness of breath.   Cardiovascular: Negative for chest pain, palpitations and leg swelling.  Gastrointestinal: Negative for abdominal pain and abdominal distention.  Genitourinary: Negative for dysuria, urgency and difficulty urinating.  Musculoskeletal: Negative for arthralgias and gait problem.  Skin: Negative for color change and rash.  Hematological: Negative for adenopathy.  Psychiatric/Behavioral: Negative for sleep disturbance and dysphoric mood. The patient is not nervous/anxious.        Objective:   Physical Exam  Constitutional: He is oriented to person, place, and time. He appears well-developed and well-nourished. No distress.  HENT:  Head: Normocephalic and atraumatic.  Right Ear: External ear normal. Tympanic membrane is injected, erythematous and bulging. A middle ear effusion is present.  Left Ear: External ear normal. Tympanic membrane is injected, erythematous and bulging. A middle ear effusion is present.  Nose: Nose normal.  Mouth/Throat: Oropharynx  is clear and moist. No oropharyngeal exudate.  Eyes: Conjunctivae and EOM are normal. Pupils are equal, round, and reactive to light. Right eye exhibits no discharge. Left eye exhibits no discharge. No scleral icterus.  Neck: Normal range of motion. Neck supple. No tracheal deviation present. No thyromegaly present.  Cardiovascular: Normal rate, regular rhythm and  normal heart sounds.  Exam reveals no gallop and no friction rub.   No murmur heard. Pulmonary/Chest: Effort normal and breath sounds normal. No respiratory distress. He has no wheezes. He has no rales. He exhibits no tenderness.  Musculoskeletal: Normal range of motion. He exhibits no edema.  Lymphadenopathy:    He has no cervical adenopathy.  Neurological: He is alert and oriented to person, place, and time. No cranial nerve deficit. Coordination normal.  Skin: Skin is warm and dry. No rash noted. He is not diaphoretic. No erythema. No pallor.  Psychiatric: He has a normal mood and affect. His behavior is normal. Judgment and thought content normal.          Assessment & Plan:

## 2012-04-23 NOTE — Telephone Encounter (Signed)
Left detailed information on patient voicemail

## 2012-04-23 NOTE — Assessment & Plan Note (Signed)
Exam is consistent with chronic otitis media. Patient never completed course of Bactrim. We'll have him complete ten-day course of Bactrim. Given inflammation in the external ear canal, will also start Ciprodex. Followup 4 weeks for reevaluation.

## 2012-04-23 NOTE — Assessment & Plan Note (Signed)
Wt Readings from Last 3 Encounters:  04/23/12 239 lb (108.41 kg)  03/17/12 243 lb (110.224 kg)  02/25/12 239 lb (108.41 kg)   Congratulated patient on 4 pounds weight loss. Encouraged him to continue with healthy diet and regular physical activity.Continue phentermine to help with appetite suppression. Follow up 1 month.

## 2012-05-28 ENCOUNTER — Ambulatory Visit: Payer: BC Managed Care – PPO | Admitting: Internal Medicine

## 2012-06-29 ENCOUNTER — Encounter: Payer: Self-pay | Admitting: Adult Health

## 2012-06-29 ENCOUNTER — Ambulatory Visit (INDEPENDENT_AMBULATORY_CARE_PROVIDER_SITE_OTHER): Payer: BC Managed Care – PPO | Admitting: Adult Health

## 2012-06-29 VITALS — BP 108/60 | HR 74 | Temp 98.7°F | Resp 12 | Wt 233.5 lb

## 2012-06-29 DIAGNOSIS — R52 Pain, unspecified: Secondary | ICD-10-CM

## 2012-06-29 MED ORDER — CYCLOBENZAPRINE HCL 10 MG PO TABS: 10.0000 mg | ORAL_TABLET | Freq: Three times a day (TID) | ORAL | Status: DC | PRN

## 2012-06-29 NOTE — Progress Notes (Signed)
  Subjective:    Patient ID: Calvin Butler, male    DOB: 02/10/1975, 37 y.o.   MRN: [id number]  HPI  Pt is a 37 y/o male who presents to clinic with c/o sharp pain behind left ear radiating down to neck for 1.5 weeks. Initially he thought it might be sinus congestion and he started to take decongestant. This did not relieve his symptoms. He denies fever, chills, malaise. Denies headaches or visual disturbance. Pain appears to be worse with movement.   Current Outpatient Prescriptions on File Prior to Visit  Medication Sig Dispense Refill  . albuterol (PROVENTIL HFA;VENTOLIN HFA) 108 (90 BASE) MCG/ACT inhaler Inhale 2 puffs into the lungs every 6 (six) hours as needed for wheezing.  1 Inhaler  6  . Azelastine-Fluticasone 137-50 MCG/ACT SUSP Place 1 spray into the nose 2 (two) times daily.  23 g  1  . cholecalciferol (VITAMIN D) 1000 UNITS tablet Take 1,000 Units by mouth daily.      . ciprofloxacin-dexamethasone (CIPRODEX) otic suspension Place 4 drops into both ears 2 (two) times daily.  7.5 mL  0  . Cyanocobalamin (VITAMIN B-12) 1000 MCG SUBL Place under the tongue daily.      Marland Kitchen ibuprofen (ADVIL,MOTRIN) 200 MG tablet Take 200 mg by mouth every 6 (six) hours as needed for pain.      Marland Kitchen OMEGA-3 KRILL OIL PO Take by mouth daily.      . phentermine 37.5 MG capsule Take 1 capsule (37.5 mg total) by mouth every morning.  30 capsule  1   No current facility-administered medications on file prior to visit.     Review of Systems  Constitutional: Negative for fever and chills.  HENT: Positive for neck pain. Negative for ear pain, congestion, rhinorrhea, neck stiffness, postnasal drip and sinus pressure.   Eyes: Negative for visual disturbance.  Neurological: Negative for dizziness, syncope, weakness, light-headedness and headaches.  Psychiatric/Behavioral: Negative.        Objective:   Physical Exam  Constitutional: He is oriented to person, place, and time. He appears well-developed and  well-nourished. No distress.  HENT:  Head: Normocephalic and atraumatic.  Right Ear: External ear normal.  Left Ear: External ear normal.  Mouth/Throat: Oropharynx is clear and moist. No oropharyngeal exudate.  Tenderness behind left ear upon palpation.  Neck: Normal range of motion.  Cardiovascular: Normal rate and regular rhythm.   Pulmonary/Chest: Effort normal and breath sounds normal.  Lymphadenopathy:    He has no cervical adenopathy.  Neurological: He is alert and oriented to person, place, and time. No cranial nerve deficit. Coordination normal.  Psychiatric: He has a normal mood and affect. His behavior is normal. Judgment and thought content normal.          Assessment & Plan:

## 2012-06-29 NOTE — Assessment & Plan Note (Signed)
Shooting pain behind left ear aggravated by movement. Suspect may be cervical nerve impingement. Start muscle relaxer and anti-inflammatory. RTC if symptoms are not improved within one week.

## 2012-06-29 NOTE — Patient Instructions (Addendum)
  Start flexeril for muscle spasms.  Take ibuprofen 800 mg 4 times a day for 3-4 days.  Apply ice to the area for 15 mins. Alternate with heat.

## 2012-08-07 ENCOUNTER — Emergency Department: Payer: Self-pay | Admitting: Emergency Medicine

## 2012-11-25 ENCOUNTER — Other Ambulatory Visit: Payer: Self-pay

## 2013-01-20 HISTORY — PX: PERCUTANEOUS CORONARY STENT INTERVENTION (PCI-S): SHX6016

## 2013-02-02 ENCOUNTER — Emergency Department: Payer: Self-pay | Admitting: Internal Medicine

## 2013-07-25 ENCOUNTER — Emergency Department: Payer: Self-pay | Admitting: Emergency Medicine

## 2013-11-14 ENCOUNTER — Emergency Department: Payer: Self-pay | Admitting: Student

## 2013-11-14 LAB — BASIC METABOLIC PANEL
Anion Gap: 7 (ref 7–16)
BUN: 19 mg/dL — ABNORMAL HIGH (ref 7–18)
Chloride: 106 mmol/L (ref 98–107)
Co2: 26 mmol/L (ref 21–32)
Creatinine: 0.44 mg/dL — ABNORMAL LOW (ref 0.60–1.30)
EGFR (African American): >60
EGFR (Non-African Amer.): >60
Glucose: 190 mg/dL — ABNORMAL HIGH (ref 65–99)
Osmolality: 285 (ref 275–301)
Potassium: 4.3 mmol/L (ref 3.5–5.1)
Sodium: 139 mmol/L (ref 136–145)

## 2013-11-14 LAB — CBC
HCT: 50.4 % (ref 40.0–52.0)
HGB: 17.4 g/dL (ref 13.0–18.0)
MCH: 32.2 pg (ref 26.0–34.0)
MCHC: 34.4 g/dL (ref 32.0–36.0)
MCV: 94 fL (ref 80–100)
Platelet: 242 10*3/uL (ref 150–440)
RBC: 5.38 10*6/uL (ref 4.40–5.90)
RDW: 13.8 % (ref 11.5–14.5)
WBC: 13.4 10*3/uL — ABNORMAL HIGH (ref 3.8–10.6)

## 2013-11-14 LAB — TROPONIN I: Troponin-I: <0.02 ng/mL

## 2013-11-21 ENCOUNTER — Ambulatory Visit (INDEPENDENT_AMBULATORY_CARE_PROVIDER_SITE_OTHER): Payer: BC Managed Care – PPO | Admitting: Cardiovascular Disease

## 2013-11-21 ENCOUNTER — Encounter: Payer: Self-pay | Admitting: Cardiovascular Disease

## 2013-11-21 VITALS — BP 126/78 | HR 80 | Ht 68.0 in | Wt 245.0 lb

## 2013-11-21 DIAGNOSIS — K3189 Other diseases of stomach and duodenum: Secondary | ICD-10-CM | POA: Insufficient documentation

## 2013-11-21 DIAGNOSIS — K219 Gastro-esophageal reflux disease without esophagitis: Secondary | ICD-10-CM

## 2013-11-21 DIAGNOSIS — R079 Chest pain, unspecified: Secondary | ICD-10-CM | POA: Insufficient documentation

## 2013-11-21 MED ORDER — PANTOPRAZOLE SODIUM 40 MG PO TBEC: 40.0000 mg | DELAYED_RELEASE_TABLET | Freq: Every day | ORAL | Status: DC

## 2013-11-21 NOTE — Patient Instructions (Addendum)
Your physician has requested that you have an exercise tolerance test. For further information please visit [email or web address]. Please also follow instruction sheet, as given. -eat a light meal  -you can take your medications  -wear gym clothing and walking shoes  Your physician has recommended you make the following change in your medication:  Start Protonix 40 mg once daily   Your physician recommends that you schedule a follow-up appointment in:  Same day as stress test

## 2013-11-21 NOTE — Assessment & Plan Note (Signed)
His symptoms are highly suggestive of GERD. I started Protonix 40 mg once daily. He currently does not have a primary care physician and I will have him follow-up with me to ensure improvement in symptoms.

## 2013-11-21 NOTE — Progress Notes (Signed)
HPI  This is a 38 year old male who was referred from the emergency room at [facility] for evaluation of chest pain. He has no previous cardiac history. He has known history of tobacco use but otherwise no significant chronic medical conditions.he smokes one pack per day. He is not aware of his family history. He went to the emergency room last week due to chest pain. He describes sharp substernal discomfort with burning sensation. Occasionally, it is associated with left arm numbness. He had these episodes associated with tachycardia followed by slow heartbeats and hot flashes. He denies significant shortness of breath. He started having significant heartburn over the last few days as well. Workup in the emergency room was unremarkable includingnegative troponin, EKG and chest x-ray.  No Known Allergies   No current outpatient prescriptions on file prior to visit.   No current facility-administered medications on file prior to visit.     Past Medical History  Diagnosis Date  . Asthma      Past Surgical History  Procedure Laterality Date  . Facial fracture surgery  at 38 yrs old    Right side face - due to accident     Family History  Problem Relation Age of Onset  . Aneurysm Mother     Brain  . Diabetes Father   . Asthma Sister   . Arthritis Daughter   . Diabetes Maternal Grandmother   . Prostate cancer Neg Hx   . Arthritis Daughter      History   Social History  . Marital Status: Single    Spouse Name: N/A    Number of Children: 2  . Years of Education: N/A   Occupational History  . Diesl Mechanic    Social History Main Topics  . Smoking status: Current Every Day Smoker -- 0.50 packs/day for 27 years  . Smokeless tobacco: Never Used  . Alcohol Use: Yes     Comment: Occasional  . Drug Use: No  . Sexual Activity: Not on file   Other Topics Concern  . Not on file   Social History Narrative   [place] in [place] with wife, daughter, sister and 2 children. Dog  in house. 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      Regular Exercise -  No   Daily Caffeine Use:  Occasional              ROS A 10 point review of system was performed. It is negative other than that mentioned in the history of present illness.   PHYSICAL EXAM   BP 126/78 mmHg  Pulse 80  Ht 5\' 8"  (1.727 m)  Wt 245 lb (111.131 kg)  BMI 37.26 kg/m2 Constitutional: He is oriented to person, place, and time. He appears well-developed and well-nourished. No distress.  HENT: No nasal discharge.  Head: Normocephalic and atraumatic.  Eyes: Pupils are equal and round.  No discharge. Neck: Normal range of motion. Neck supple. No JVD present. No thyromegaly present.  Cardiovascular: Normal rate, regular rhythm, normal heart sounds. Exam reveals no gallop and no friction rub. No murmur heard.  Pulmonary/Chest: Effort normal and breath sounds normal. No stridor. No respiratory distress. He has no wheezes. He has no rales. He exhibits no tenderness.  Abdominal: Soft. Bowel sounds are normal. He exhibits no distension. There is no tenderness. There is no rebound and no guarding.  Musculoskeletal: Normal range of motion. He exhibits no edema and no tenderness.  Neurological: He is alert and oriented to person, place, and  time. Coordination normal.  Skin: Skin is warm and dry. No rash noted. He is not diaphoretic. No erythema. No pallor.  Psychiatric: He has a normal mood and affect. His behavior is normal. Judgment and thought content normal.       ATF:TDDUKGEKG:normal sinus rhythm with no significant ST or T wave changes.   ASSESSMENT AND PLAN

## 2013-11-21 NOTE — Assessment & Plan Note (Signed)
The chest pain seems to be overall atypical and likely GI in nature. However, he has associated left arm numbness. EKG is unremarkable. I requested a treadmill stress test for evaluation.

## 2013-11-23 ENCOUNTER — Encounter: Payer: Self-pay | Admitting: Cardiovascular Disease

## 2013-11-23 ENCOUNTER — Ambulatory Visit (INDEPENDENT_AMBULATORY_CARE_PROVIDER_SITE_OTHER): Payer: BC Managed Care – PPO | Admitting: Cardiovascular Disease

## 2013-11-23 ENCOUNTER — Encounter: Payer: Self-pay | Admitting: *Deleted

## 2013-11-23 DIAGNOSIS — R079 Chest pain, unspecified: Secondary | ICD-10-CM

## 2013-11-23 NOTE — Procedures (Signed)
   Treadmill Stress test  Indication: chest pain.  Baseline Data:  Resting EKG shows NSR with rate of 94 bpm, no significant ST or T wave changes Resting blood pressure of 123/86 mm Hg Stand bruce protocal was used.  Exercise Data:  Patient exercised for 9 min 20 sec,  Peak heart rate of 155 bpm.  This was 85% of the maximum predicted heart rate. The patient had chest pain before starting the stress test which worsened with exercise. Peak Blood pressure recorded was 214/74 Maximal work level: 10.7 METs.  Heart rate at 3 minutes in recovery was 100 bpm. BP response: normal HR response: hypertensive  EKG with Exercise: sinus tachycardia with no significant ST changes  FINAL IMPRESSION: Normal exercise stress test. No significant EKG changes concerning for ischemia. good exercise tolerance. Chest pain at baseline which worsened with exercise.  Recommendation: treat for presumed GERD. Given that the patient had worsening chest pain with exercise, further ischemic workup with cardiac catheterization might be considered if symptoms do not improve with a PPI.

## 2013-11-23 NOTE — Patient Instructions (Signed)
Your stress test is normal.  Start taking Protonix 40 mg once daily.   Follow up in 2 weeks.   Your next appointment will be scheduled in our new office located at :  [facility]  [address], [address]  [place], [place] [id number]

## 2013-12-08 ENCOUNTER — Ambulatory Visit (INDEPENDENT_AMBULATORY_CARE_PROVIDER_SITE_OTHER): Payer: BC Managed Care – PPO | Admitting: Cardiovascular Disease

## 2013-12-08 ENCOUNTER — Encounter: Payer: Self-pay | Admitting: Cardiovascular Disease

## 2013-12-08 VITALS — BP 118/78 | HR 80 | Ht 68.0 in | Wt 242.0 lb

## 2013-12-08 DIAGNOSIS — Z720 Tobacco use: Secondary | ICD-10-CM

## 2013-12-08 DIAGNOSIS — F17200 Nicotine dependence, unspecified, uncomplicated: Secondary | ICD-10-CM

## 2013-12-08 DIAGNOSIS — R0789 Other chest pain: Secondary | ICD-10-CM

## 2013-12-08 DIAGNOSIS — Z01812 Encounter for preprocedural laboratory examination: Secondary | ICD-10-CM

## 2013-12-08 MED ORDER — VARENICLINE TARTRATE 0.5 MG X 11 & 1 MG X 42 PO MISC: ORAL | Status: DC

## 2013-12-08 MED ORDER — VARENICLINE TARTRATE 1 MG PO TABS: 1.0000 mg | ORAL_TABLET | Freq: Two times a day (BID) | ORAL | Status: DC

## 2013-12-08 NOTE — Progress Notes (Signed)
HPI  This is a 38 year old male who was admitted here today for a follow-up visit regarding chest pain. He has no previous cardiac history. He has known history of tobacco use but otherwise no significant chronic medical conditions.he smokes one pack per day. He is not aware of his family history. He was seen recently for symptoms of exertional chest tightness and shortness of breath. He also had symptoms suggestive of GERD and was started on Protonix. I proceeded with a treadmill stress test which was negative for ischemia by EKG criteria. However, he had significant chest pain during exercise. He reports no improvement in symptoms with Protonix. He continues to complain of exertional dyspnea and shortness of breath with relatively mild activities.  No Known Allergies   Current Outpatient Prescriptions on File Prior to Visit  Medication Sig Dispense Refill  . ibuprofen (ADVIL,MOTRIN) 600 MG tablet Take 600 mg by mouth every 6 (six) hours as needed.    . pantoprazole (PROTONIX) 40 MG tablet Take 1 tablet (40 mg total) by mouth daily. 30 tablet 11   No current facility-administered medications on file prior to visit.     Past Medical History  Diagnosis Date  . Asthma      Past Surgical History  Procedure Laterality Date  . Facial fracture surgery  at 38 yrs old    Right side face - due to accident     Family History  Problem Relation Age of Onset  . Aneurysm Mother     Brain  . Diabetes Father   . Asthma Sister   . Arthritis Daughter   . Diabetes Maternal Grandmother   . Prostate cancer Neg Hx   . Arthritis Daughter      History   Social History  . Marital Status: Single    Spouse Name: N/A    Number of Children: 2  . Years of Education: N/A   Occupational History  . Diesl Mechanic    Social History Main Topics  . Smoking status: Current Every Day Smoker -- 0.50 packs/day for 27 years  . Smokeless tobacco: Never Used  . Alcohol Use: Yes     Comment:  Occasional  . Drug Use: No  . Sexual Activity: Not on file   Other Topics Concern  . Not on file   Social History Narrative   [place]ives in [place] with wife, daughter, sister and 2 children. Dog in house. 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      Regular Exercise -  No   Daily Caffeine Use:  Occasional              ROS A 10 point review of system was performed. It is negative other than that mentioned in the history of present illness.   PHYSICAL EXAM   BP 118/78 mmHg  Pulse 80  Ht 5\' 8"  (1.727 m)  Wt 242 lb (109.77 kg)  BMI 36.80 kg/m2 Constitutional: He is oriented to person, place, and time. He appears well-developed and well-nourished. No distress.  HENT: No nasal discharge.  Head: Normocephalic and atraumatic.  Eyes: Pupils are equal and round.  No discharge. Neck: Normal range of motion. Neck supple. No JVD present. No thyromegaly present.  Cardiovascular: Normal rate, regular rhythm, normal heart sounds. Exam reveals no gallop and no friction rub. No murmur heard.  Pulmonary/Chest: Effort normal and breath sounds normal. No stridor. No respiratory distress. He has no wheezes. He has no rales. He exhibits no tenderness.  Abdominal: Soft. Bowel sounds are  normal. He exhibits no distension. There is no tenderness. There is no rebound and no guarding.  Musculoskeletal: Normal range of motion. He exhibits no edema and no tenderness.  Neurological: He is alert and oriented to person, place, and time. Coordination normal.  Skin: Skin is warm and dry. No rash noted. He is not diaphoretic. No erythema. No pallor.  Psychiatric: He has a normal mood and affect. His behavior is normal. Judgment and thought content normal.      ASSESSMENT AND PLAN

## 2013-12-08 NOTE — Patient Instructions (Addendum)
[facility] Cardiac Cath Instructions   You are scheduled for a Cardiac Cath on:_________________________  Please arrive at _______am on the day of your procedure  You will need to pre-register prior to the day of your procedure.  [facility].  Registration is the first desk on your right.  Please take the procedure order we have given you in order to be registered appropriately  Do not eat/drink anything after midnight  Someone will need to drive you home  It is recommended someone be with you for the first 24 hours after your procedure  Wear clothes that are easy to get on/off and wear slip on shoes if possible   Medications bring a current list of all medications with you  __x_ You may take all of your medications the morning of your procedure with enough water to swallow safely    Day of your procedure: Arrive at the Medical Mall entrance.  Free valet service is available.  After entering the Medical Mall please check-in at the registration desk (1st desk on your right) to receive your armband. After receiving your armband someone will escort you to the cardiac cath/special procedures waiting area.  The usual length of stay after your procedure is about 2 to 3 hours.  This can vary.  If you have any questions, please call our office at [phone], or you may call the cardiac cath lab at [facility] directly at [phone]  Please take labs and chest x ray order to the medical mall registration desk at [facility] tomorrow to be completed   Your physician has recommended you make the following change in your medication:  Start Chantix as ordered

## 2013-12-09 ENCOUNTER — Telehealth: Payer: Self-pay | Admitting: *Deleted

## 2013-12-09 ENCOUNTER — Ambulatory Visit: Payer: Self-pay | Admitting: Cardiovascular Disease

## 2013-12-09 LAB — CBC WITH DIFFERENTIAL/PLATELET
Basophil #: 0.1 10*3/uL (ref 0.0–0.1)
Basophil %: 0.6 %
Eosinophil #: 0.2 10*3/uL (ref 0.0–0.7)
Eosinophil %: 1.9 %
HCT: 47.8 % (ref 40.0–52.0)
HGB: 16.2 g/dL (ref 13.0–18.0)
Lymphocyte #: 3.1 10*3/uL (ref 1.0–3.6)
Lymphocyte %: 27.7 %
MCH: 31.6 pg (ref 26.0–34.0)
MCHC: 33.9 g/dL (ref 32.0–36.0)
MCV: 93 fL (ref 80–100)
Monocyte #: 1.3 x10 3/mm — ABNORMAL HIGH (ref 0.2–1.0)
Monocyte %: 11.6 %
Neutrophil #: 6.5 10*3/uL (ref 1.4–6.5)
Neutrophil %: 58.2 %
Platelet: 210 10*3/uL (ref 150–440)
RBC: 5.13 10*6/uL (ref 4.40–5.90)
RDW: 13.7 % (ref 11.5–14.5)
WBC: 11.1 10*3/uL — ABNORMAL HIGH (ref 3.8–10.6)

## 2013-12-09 LAB — BASIC METABOLIC PANEL
Anion Gap: 7 (ref 7–16)
BUN: 26 mg/dL — ABNORMAL HIGH (ref 7–18)
Calcium, Total: 7.9 mg/dL — ABNORMAL LOW (ref 8.5–10.1)
Chloride: 110 mmol/L — ABNORMAL HIGH (ref 98–107)
Co2: 25 mmol/L (ref 21–32)
Creatinine: 1.19 mg/dL (ref 0.60–1.30)
EGFR (African American): >60
EGFR (Non-African Amer.): >60
Glucose: 143 mg/dL — ABNORMAL HIGH (ref 65–99)
Potassium: 4 mmol/L (ref 3.5–5.1)
Sodium: 142 mmol/L (ref 136–145)

## 2013-12-09 LAB — PROTIME-INR
INR: 1
Prothrombin Time: 13 secs (ref 11.5–14.7)

## 2013-12-09 NOTE — Assessment & Plan Note (Signed)
Although the patient's treadmill stress test was negative for ischemia, the patient continues to have significant exertional chest pain and dyspnea. Symptoms did not improve after treating presumed GERD. Given the exertional nature of his symptoms, I suggested proceeding with cardiac catheterization for a definitive diagnosis. I discussed with him the risks, benefits and alternatives and he wants to proceed.

## 2013-12-09 NOTE — Telephone Encounter (Signed)
Patient returned call  Reviewed cath date time and instructions  Patient verbalized understanding  Patient will have labs drawn today

## 2013-12-09 NOTE — Telephone Encounter (Signed)
Faxed cardiac cath orders  Olegario MessierKathy in specials conformed receipt

## 2013-12-09 NOTE — Assessment & Plan Note (Signed)
I discussed with him the importance of smoking cessation. He is requesting some help and thus I prescribed Chantix. I explained side effects.

## 2013-12-09 NOTE — Telephone Encounter (Signed)
Called to inform patient his cath is scheduled for 12/12/13 at 11:30 am  Patient to arrive at 10:30 am  Patient to have [facility] at [facility] (orders already given)  No answer no vm

## 2013-12-12 ENCOUNTER — Encounter: Payer: Self-pay | Admitting: Cardiovascular Disease

## 2013-12-12 ENCOUNTER — Other Ambulatory Visit: Payer: Self-pay

## 2013-12-12 ENCOUNTER — Ambulatory Visit: Payer: Self-pay | Admitting: Cardiovascular Disease

## 2013-12-12 DIAGNOSIS — R0789 Other chest pain: Secondary | ICD-10-CM

## 2013-12-12 DIAGNOSIS — Z01812 Encounter for preprocedural laboratory examination: Secondary | ICD-10-CM

## 2013-12-12 DIAGNOSIS — I209 Angina pectoris, unspecified: Secondary | ICD-10-CM

## 2013-12-29 ENCOUNTER — Ambulatory Visit: Payer: BC Managed Care – PPO | Admitting: Cardiovascular Disease

## 2014-04-05 ENCOUNTER — Ambulatory Visit: Payer: Self-pay | Admitting: Nurse Practitioner

## 2014-04-07 ENCOUNTER — Ambulatory Visit (INDEPENDENT_AMBULATORY_CARE_PROVIDER_SITE_OTHER)
Admission: RE | Admit: 2014-04-07 | Discharge: 2014-04-07 | Disposition: A | Discharge status: Home / Self Care | Payer: BLUE CROSS/BLUE SHIELD | Source: Ambulatory Visit | Attending: Nurse Practitioner | Admitting: Nurse Practitioner

## 2014-04-07 ENCOUNTER — Ambulatory Visit (INDEPENDENT_AMBULATORY_CARE_PROVIDER_SITE_OTHER): Payer: BLUE CROSS/BLUE SHIELD | Admitting: Nurse Practitioner

## 2014-04-07 ENCOUNTER — Encounter: Payer: Self-pay | Admitting: Nurse Practitioner

## 2014-04-07 VITALS — BP 118/70 | HR 69 | Temp 97.7°F | Resp 14 | Ht 68.0 in | Wt 252.0 lb

## 2014-04-07 DIAGNOSIS — R358 Other polyuria: Secondary | ICD-10-CM

## 2014-04-07 DIAGNOSIS — F17200 Nicotine dependence, unspecified, uncomplicated: Secondary | ICD-10-CM

## 2014-04-07 DIAGNOSIS — M25571 Pain in right ankle and joints of right foot: Secondary | ICD-10-CM | POA: Diagnosis not present

## 2014-04-07 DIAGNOSIS — R3589 Other polyuria: Secondary | ICD-10-CM

## 2014-04-07 DIAGNOSIS — R109 Unspecified abdominal pain: Secondary | ICD-10-CM | POA: Diagnosis not present

## 2014-04-07 DIAGNOSIS — Z720 Tobacco use: Secondary | ICD-10-CM | POA: Diagnosis not present

## 2014-04-07 LAB — POCT URINALYSIS DIPSTICK
Bilirubin, UA: NEGATIVE
Blood, UA: NEGATIVE
Glucose, UA: >=1000
Leukocytes, UA: NEGATIVE
Nitrite, UA: NEGATIVE
Protein, UA: NEGATIVE
Spec Grav, UA: 1.025
Urobilinogen, UA: 0.2
pH, UA: 5.5

## 2014-04-07 LAB — COMPREHENSIVE METABOLIC PANEL
ALT: 47 U/L (ref 0–53)
AST: 35 U/L (ref 0–37)
Albumin: 4 g/dL (ref 3.5–5.2)
Alkaline Phosphatase: 88 U/L (ref 39–117)
BUN: 21 mg/dL (ref 6–23)
CO2: 25 mEq/L (ref 19–32)
Calcium: 9.2 mg/dL (ref 8.4–10.5)
Chloride: 104 mEq/L (ref 96–112)
Creatinine, Ser: 0.88 mg/dL (ref 0.40–1.50)
GFR: 102.23 mL/min (ref >60.00)
Glucose, Bld: 296 mg/dL — ABNORMAL HIGH (ref 70–99)
Potassium: 4.3 mEq/L (ref 3.5–5.1)
Sodium: 134 mEq/L — ABNORMAL LOW (ref 135–145)
Total Bilirubin: 0.4 mg/dL (ref 0.2–1.2)
Total Protein: 7 g/dL (ref 6.0–8.3)

## 2014-04-07 LAB — HEMOGLOBIN A1C: Hgb A1c MFr Bld: 11 % — ABNORMAL HIGH (ref 4.6–6.5)

## 2014-04-07 MED ORDER — CYCLOBENZAPRINE HCL 5 MG PO TABS: 5.0000 mg | ORAL_TABLET | Freq: Three times a day (TID) | ORAL | Status: DC | PRN

## 2014-04-07 MED ORDER — VARENICLINE TARTRATE 0.5 MG X 11 & 1 MG X 42 PO MISC: ORAL | Status: DC

## 2014-04-07 MED ORDER — ALBUTEROL SULFATE HFA 108 (90 BASE) MCG/ACT IN AERS: 2.0000 | INHALATION_SPRAY | Freq: Four times a day (QID) | RESPIRATORY_TRACT | Status: DC | PRN

## 2014-04-07 NOTE — Progress Notes (Signed)
Subjective:    Patient ID: Calvin Butler, male    DOB: [birth year], 39 y.o.   MRN: [id number]  HPI  Calvin Butler is a 39 yo male with a CC of right side flank pain and foot pain.  1) Flank pain-  No pain with using the bathroom   Peeing all the time.   3-4 weeks ago, pees mutlipel times a day having trouble holding it.   Nocturia- not every night- twice a week 2 x a night and not doing this previously  Diet- not carb heavy Juice- grape juice   Right side pain- feels like a pulled muscle    2) Foot pain- cannot put pressure on right foot in the morning, feels better about 1 hour after slowly walking on. Pain- sharp pain, staying off foot for awhile and then goes to move-painful, heel and ankle is painful    3) Wheezing more recently, needs inhaler refilled   Smoking, started back on Chantix this week.   Review of Systems  Constitutional: Positive for fatigue. Negative for fever, chills, diaphoresis and activity change.  HENT: Negative for sore throat, tinnitus and trouble swallowing.        Dry mouth in the morning  Eyes: Positive for visual disturbance.       Right eye unable to see out of due to work accident years ago  Respiratory: Negative for chest tightness, shortness of breath and wheezing.   Gastrointestinal: Negative for nausea, vomiting and diarrhea.  Endocrine: Positive for polydipsia and polyuria. Negative for polyphagia.  Genitourinary: Positive for urgency, frequency and flank pain. Negative for dysuria, hematuria, decreased urine volume, scrotal swelling, enuresis, difficulty urinating, penile pain and testicular pain.  Musculoskeletal: Negative for myalgias, back pain, joint swelling, neck pain and neck stiffness.       Right foot popping and painful  Skin: Negative for rash.  Neurological: Negative for dizziness, weakness and numbness.   Past Medical History  Diagnosis Date  . Asthma     History   Social History  . Marital Status: Single    Spouse  Name: N/A  . Number of Children: 2  . Years of Education: N/A   Occupational History  . Diesl Mechanic    Social History Main Topics  . Smoking status: Current Every Day Smoker -- 0.50 packs/day for 27 years  . Smokeless tobacco: Never Used  . Alcohol Use: Yes     Comment: Occasional  . Drug Use: No  . Sexual Activity: Not on file   Other Topics Concern  . Not on file   Social History Narrative   Lives in [place] with wife, daughter, sister and 2 children. Dog in house. 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      Regular Exercise -  No   Daily Caffeine Use:  Occasional             Past Surgical History  Procedure Laterality Date  . Facial fracture surgery  at 39 yrs old    Right side face - due to accident    Family History  Problem Relation Age of Onset  . Aneurysm Mother     Brain  . Diabetes Father   . Asthma Sister   . Arthritis Daughter   . Diabetes Maternal Grandmother   . Prostate cancer Neg Hx   . Arthritis Daughter     No Known Allergies  Current Outpatient Prescriptions on File Prior to Visit  Medication Sig Dispense Refill  . ibuprofen (ADVIL,MOTRIN)  600 MG tablet Take 600 mg by mouth every 6 (six) hours as needed.    . varenicline (CHANTIX CONTINUING MONTH PAK) 1 MG tablet Take 1 tablet (1 mg total) by mouth 2 (two) times daily. 60 tablet 1   No current facility-administered medications on file prior to visit.      Objective:   Physical Exam  Constitutional: Calvin Butler is oriented to person, place, and time. Calvin Butler appears well-developed and well-nourished. No distress.  BP 118/70 mmHg  Pulse 69  Temp(Src) 97.7 F (36.5 C) (Oral)  Resp 14  Ht 5\' 8"  (1.727 m)  Wt 252 lb (114.306 kg)  BMI 38.33 kg/m2  SpO2 95%   HENT:  Head: Normocephalic and atraumatic.  Right Ear: External ear normal.  Left Ear: External ear normal.  Eyes: Right eye exhibits no discharge. Left eye exhibits no discharge. No scleral icterus.  Neck: Normal range of motion. Neck supple.  No thyromegaly present.  Cardiovascular: Normal rate, regular rhythm, normal heart sounds and intact distal pulses.  Exam reveals no gallop and no friction rub.   No murmur heard. Pulmonary/Chest: Effort normal and breath sounds normal. No respiratory distress. Calvin Butler has no wheezes. Calvin Butler has no rales. Calvin Butler exhibits no tenderness.  Abdominal: Soft. Bowel sounds are normal. Calvin Butler exhibits no distension and no mass. There is no tenderness. There is no rebound, no guarding and no CVA tenderness.  Musculoskeletal: Calvin Butler exhibits tenderness.  Right ankle tender laterally on exam.   Lymphadenopathy:    Calvin Butler has no cervical adenopathy.  Neurological: Calvin Butler is alert and oriented to person, place, and time.  Skin: Skin is warm and dry. No rash noted. Calvin Butler is not diaphoretic.  Psychiatric: Calvin Butler has a normal mood and affect. His behavior is normal. Judgment and thought content normal.      Assessment & Plan:

## 2014-04-07 NOTE — Patient Instructions (Signed)
Try the starting pack again for Chantix to help stop smoking.  [place] [facility] Address:  Address: [address], [place], [place] [address]  [phone] X-rays until 4 pm.  Please visit the lab before leaving today.

## 2014-04-07 NOTE — Progress Notes (Signed)
Pre visit review using our clinic review tool, if applicable. No additional management support is needed unless otherwise documented below in the visit note. 

## 2014-04-13 DIAGNOSIS — R358 Other polyuria: Secondary | ICD-10-CM | POA: Insufficient documentation

## 2014-04-13 DIAGNOSIS — M25571 Pain in right ankle and joints of right foot: Secondary | ICD-10-CM | POA: Insufficient documentation

## 2014-04-13 DIAGNOSIS — R3589 Other polyuria: Secondary | ICD-10-CM | POA: Insufficient documentation

## 2014-04-13 DIAGNOSIS — R109 Unspecified abdominal pain: Secondary | ICD-10-CM | POA: Insufficient documentation

## 2014-04-13 NOTE — Assessment & Plan Note (Signed)
Will check A1c and CMET since POCT urine shows a lot of glucose. Will follow.

## 2014-04-13 NOTE — Assessment & Plan Note (Signed)
Flank pain may be muscular. No CVA tenderness. Flexeril 5 mg up to 3 x a day. Will follow.

## 2014-04-13 NOTE — Assessment & Plan Note (Signed)
Will obtain x-ray. Okay to take ibuprofen.

## 2014-04-13 NOTE — Assessment & Plan Note (Signed)
Gave pt new starting pack for Chantix to start over again (only had maintenance).

## 2014-04-19 ENCOUNTER — Encounter: Payer: Self-pay | Admitting: Internal Medicine

## 2014-04-20 ENCOUNTER — Telehealth: Payer: Self-pay

## 2014-04-20 ENCOUNTER — Other Ambulatory Visit: Payer: Self-pay | Admitting: *Deleted

## 2014-04-20 MED ORDER — METFORMIN HCL 500 MG PO TABS: 500.0000 mg | ORAL_TABLET | Freq: Two times a day (BID) | ORAL | Status: DC

## 2014-04-20 NOTE — Telephone Encounter (Signed)
The patient stated he wants to speak with the cma regarding his test results.

## 2014-04-20 NOTE — Telephone Encounter (Signed)
Per C Doss, send Metformine 500mg  BID #60 with 2 refills, and set up follow up with Dr Dan HumphreysWalker

## 2014-04-20 NOTE — Telephone Encounter (Signed)
Calvin Butler seen pt and had results.  Per Calvin Sonarrie, sent Rx to pharmacy and scheduled follow up appt with Dr Dan HumphreysWalker

## 2014-04-21 ENCOUNTER — Telehealth: Payer: Self-pay | Admitting: Internal Medicine

## 2014-04-21 NOTE — Telephone Encounter (Signed)
FYI in Dr Sonny DandyWalkers absence

## 2014-04-21 NOTE — Telephone Encounter (Signed)
Spoke with pt advised of MDs message.  Pt verbalized understanding. 

## 2014-04-21 NOTE — Telephone Encounter (Signed)
[facility] - [place] TELEPHONE ADVICE RECORD [facility] Patient Name: Calvin Butler DOB: 05/12/1975 Initial Comment Caller states just dx w/diabetes; began meds yesterday; took pill, then went to work; w/o eating; checked BS was 45; has only had water today and now BS is 350; is hungry because has not eaten; should take another pill? Nurse Assessment Nurse: Lane HackerHarley, RN, Elvin SoWindy Date/Time (Eastern Time): 04/21/2014 1:50:10 PM Confirm and document reason for call. If symptomatic, describe symptoms. ---Caller states just newly diagnosed NIDDM. He began Metformin 500 mg BID last night. He took pill w/o eating then went to work. Did have water. 7 am fasting blood sugar was 45 (checked at work and after having taken his pill). At 1:45 pm, fasting blood sugar is 350. He is hungry because has not eaten, and wonders should take another pill? Has the patient traveled out of the country within the last 30 days? ---Not Applicable Does the patient require triage? ---Yes Related visit to physician within the last 2 weeks? ---Yes Does the PT have any chronic conditions? (i.e. diabetes, asthma, etc.) ---Yes List chronic conditions. ---NIDDM Guidelines Guideline Title Affirmed Question Affirmed Notes Diabetes - High Blood Sugar [1] Blood glucose > 240 mg/dl (13 mmol/ l) AND [7][2] does not use insulin (e.g., not insulin-dependent; most type 2 diabetics) (all triage questions negative) Final Disposition User Home Care [place] CenterHarley, RN, [place] Comments Just rechecked his Blood Sugar for nurse and was 72 at 2 pm. RN advised that he recheck it once more making sure that he had a good drop of blood on the test strip. Rechecked, and 293. Most likely error This AM with reading.  RN continued the triage outcome to home care.

## 2014-04-21 NOTE — Telephone Encounter (Signed)
It appears his sugars are varying.  I think this is the way he is checking his sugars.  He needs to make sure he is washing his hands prior to checking his sugars.  He needs to make sure his meter is calibrated.  He needs to eat regular meals with low carb diet - as discussed with him at his visit.  He needs to take his medication as instructed.  He needs to call in sugars on Monday 04/24/14.  Also needs a f/u appt with Dr Dan HumphreysWalker or referral to endocrinology (if Dr Dan HumphreysWalker desires referral).  If any problems over weekend, call - endo on call.

## 2014-05-23 ENCOUNTER — Ambulatory Visit: Payer: BLUE CROSS/BLUE SHIELD | Admitting: Internal Medicine

## 2014-07-12 ENCOUNTER — Emergency Department
Admission: EM | Admit: 2014-07-12 | Discharge: 2014-07-12 | Disposition: A | Discharge status: Home / Self Care | Payer: Self-pay | Attending: Emergency Medicine | Admitting: Emergency Medicine

## 2014-07-12 ENCOUNTER — Emergency Department: Payer: BLUE CROSS/BLUE SHIELD

## 2014-07-12 ENCOUNTER — Encounter: Payer: Self-pay | Admitting: Emergency Medicine

## 2014-07-12 DIAGNOSIS — E119 Type 2 diabetes mellitus without complications: Secondary | ICD-10-CM | POA: Insufficient documentation

## 2014-07-12 DIAGNOSIS — Z720 Tobacco use: Secondary | ICD-10-CM | POA: Insufficient documentation

## 2014-07-12 DIAGNOSIS — M722 Plantar fascial fibromatosis: Secondary | ICD-10-CM | POA: Insufficient documentation

## 2014-07-12 HISTORY — DX: Type 2 diabetes mellitus without complications: E11.9

## 2014-07-12 MED ORDER — TRAMADOL HCL 50 MG PO TABS: 50.0000 mg | ORAL_TABLET | Freq: Three times a day (TID) | ORAL | Status: DC | PRN

## 2014-07-12 MED ORDER — MELOXICAM 15 MG PO TABS: 15.0000 mg | ORAL_TABLET | Freq: Every day | ORAL | Status: DC | PRN

## 2014-07-12 MED ORDER — MELOXICAM 7.5 MG PO TABS
ORAL_TABLET | ORAL | Status: AC
  Administered 2014-07-12: 15 mg via ORAL
  Filled 2014-07-12: qty 1

## 2014-07-12 MED ORDER — MELOXICAM 7.5 MG PO TABS
15.0000 mg | ORAL_TABLET | Freq: Once | ORAL | Status: AC
  Administered 2014-07-12: 15 mg via ORAL

## 2014-07-12 NOTE — ED Notes (Signed)
Right foot pain .Marland Kitchenunsure of injury  Ambulates well to room

## 2014-07-12 NOTE — Discharge Instructions (Signed)
Take medication as prescribed. Rest at as often as able. Apply ice. Wear good supportive shoes. Do not take additional anti-inflammatories such as ibuprofen with movement.  As discussed follow-up with podiatry for continued pain.  Return to the ER for new or worsening concerns.  Plantar Fasciitis Plantar fasciitis is a common condition that causes foot pain. It is soreness (inflammation) of the band of tough fibrous tissue on the bottom of the foot that runs from the heel bone (calcaneus) to the ball of the foot. The cause of this soreness may be from excessive standing, poor fitting shoes, running on hard surfaces, being overweight, having an abnormal walk, or overuse (this is common in runners) of the painful foot or feet. It is also common in aerobic exercise dancers and ballet dancers. SYMPTOMS  Most people with plantar fasciitis complain of:  Severe pain in the morning on the bottom of their foot especially when taking the first steps out of bed. This pain recedes after a few minutes of walking.  Severe pain is experienced also during walking following a long period of inactivity.  Pain is worse when walking barefoot or up stairs DIAGNOSIS   Your caregiver will diagnose this condition by examining and feeling your foot.  Special tests such as X-rays of your foot, are usually not needed. PREVENTION   Consult a sports medicine professional before beginning a new exercise program.  Walking programs offer a good workout. With walking there is a lower chance of overuse injuries common to runners. There is less impact and less jarring of the joints.  Begin all new exercise programs slowly. If problems or pain develop, decrease the amount of time or distance until you are at a comfortable level.  Wear good shoes and replace them regularly.  Stretch your foot and the heel cords at the back of the ankle (Achilles tendon) both before and after exercise.  Run or exercise on even surfaces  that are not hard. For example, asphalt is better than pavement.  Do not run barefoot on hard surfaces.  If using a treadmill, vary the incline.  Do not continue to workout if you have foot or joint problems. Seek professional help if they do not improve. HOME CARE INSTRUCTIONS   Avoid activities that cause you pain until you recover.  Use ice or cold packs on the problem or painful areas after working out.  Only take over-the-counter or prescription medicines for pain, discomfort, or fever as directed by your caregiver.  Soft shoe inserts or athletic shoes with air or gel sole cushions may be helpful.  If problems continue or become more severe, consult a sports medicine caregiver or your own health care provider. Cortisone is a potent anti-inflammatory medication that may be injected into the painful area. You can discuss this treatment with your caregiver. MAKE SURE YOU:   Understand these instructions.  Will watch your condition.  Will get help right away if you are not doing well or get worse. Document Released: 10/01/2000 Document Revised: 03/31/2011 Document Reviewed: 12/01/2007 [facility] Patient Information 2015 [place], [place]. This information is not intended to replace advice given to you by your health care provider. Make sure you discuss any questions you have with your health care provider.  Plantar Fasciitis (Heel Spur Syndrome) with Rehab The plantar fascia is a fibrous, ligament-like, soft-tissue structure that spans the bottom of the foot. Plantar fasciitis is a condition that causes pain in the foot due to inflammation of the tissue. SYMPTOMS  Pain and tenderness on the underneath side of the foot.  Pain that worsens with standing or walking. CAUSES  Plantar fasciitis is caused by irritation and injury to the plantar fascia on the underneath side of the foot. Common mechanisms of injury include:  Direct trauma to bottom of the foot.  Damage to a small nerve  that runs under the foot where the main fascia attaches to the heel bone.  Stress placed on the plantar fascia due to bone spurs. RISK INCREASES WITH:   Activities that place stress on the plantar fascia (running, jumping, pivoting, or cutting).  Poor strength and flexibility.  Improperly fitted shoes.  Tight calf muscles.  Flat feet.  Failure to warm-up properly before activity.  Obesity. PREVENTION  Warm up and stretch properly before activity.  Allow for adequate recovery between workouts.  Maintain physical fitness:  Strength, flexibility, and endurance.  Cardiovascular fitness.  Maintain a health body weight.  Avoid stress on the plantar fascia.  Wear properly fitted shoes, including arch supports for individuals who have flat feet. PROGNOSIS  If treated properly, then the symptoms of plantar fasciitis usually resolve without surgery. However, occasionally surgery is necessary. RELATED COMPLICATIONS   Recurrent symptoms that may result in a chronic condition.  Problems of the lower back that are caused by compensating for the injury, such as limping.  Pain or weakness of the foot during push-off following surgery.  Chronic inflammation, scarring, and partial or complete fascia tear, occurring more often from repeated injections. TREATMENT  Treatment initially involves the use of ice and medication to help reduce pain and inflammation. The use of strengthening and stretching exercises may help reduce pain with activity, especially stretches of the Achilles tendon. These exercises may be performed at home or with a therapist. Your caregiver may recommend that you use heel cups of arch supports to help reduce stress on the plantar fascia. Occasionally, corticosteroid injections are given to reduce inflammation. If symptoms persist for greater than 6 months despite non-surgical (conservative), then surgery may be recommended.  MEDICATION   If pain medication is  necessary, then nonsteroidal anti-inflammatory medications, such as aspirin and ibuprofen, or other minor pain relievers, such as acetaminophen, are often recommended.  Do not take pain medication within 7 days before surgery.  Prescription pain relievers may be given if deemed necessary by your caregiver. Use only as directed and only as much as you need.  Corticosteroid injections may be given by your caregiver. These injections should be reserved for the most serious cases, because they may only be given a certain number of times. HEAT AND COLD  Cold treatment (icing) relieves pain and reduces inflammation. Cold treatment should be applied for 10 to 15 minutes every 2 to 3 hours for inflammation and pain and immediately after any activity that aggravates your symptoms. Use ice packs or massage the area with a piece of ice (ice massage).  Heat treatment may be used prior to performing the stretching and strengthening activities prescribed by your caregiver, physical therapist, or athletic trainer. Use a heat pack or soak the injury in warm water. SEEK IMMEDIATE MEDICAL CARE IF:  Treatment seems to offer no benefit, or the condition worsens.  Any medications produce adverse side effects. EXERCISES RANGE OF MOTION (ROM) AND STRETCHING EXERCISES - Plantar Fasciitis (Heel Spur Syndrome) These exercises may help you when beginning to rehabilitate your injury. Your symptoms may resolve with or without further involvement from your physician, physical therapist or athletic trainer. While completing  these exercises, remember:   Restoring tissue flexibility helps normal motion to return to the joints. This allows healthier, less painful movement and activity.  An effective stretch should be held for at least 30 seconds.  A stretch should never be painful. You should only feel a gentle lengthening or release in the stretched tissue. RANGE OF MOTION - Toe Extension, Flexion  Sit with your right /  left leg crossed over your opposite knee.  Grasp your toes and gently pull them back toward the top of your foot. You should feel a stretch on the bottom of your toes and/or foot.  Hold this stretch for __________ seconds.  Now, gently pull your toes toward the bottom of your foot. You should feel a stretch on the top of your toes and or foot.  Hold this stretch for __________ seconds. Repeat __________ times. Complete this stretch __________ times per day.  RANGE OF MOTION - Ankle Dorsiflexion, Active Assisted  Remove shoes and sit on a chair that is preferably not on a carpeted surface.  Place right / left foot under knee. Extend your opposite leg for support.  Keeping your heel down, slide your right / left foot back toward the chair until you feel a stretch at your ankle or calf. If you do not feel a stretch, slide your bottom forward to the edge of the chair, while still keeping your heel down.  Hold this stretch for __________ seconds. Repeat __________ times. Complete this stretch __________ times per day.  STRETCH - Gastroc, Standing  Place hands on wall.  Extend right / left leg, keeping the front knee somewhat bent.  Slightly point your toes inward on your back foot.  Keeping your right / left heel on the floor and your knee straight, shift your weight toward the wall, not allowing your back to arch.  You should feel a gentle stretch in the right / left calf. Hold this position for __________ seconds. Repeat __________ times. Complete this stretch __________ times per day. STRETCH - Soleus, Standing  Place hands on wall.  Extend right / left leg, keeping the other knee somewhat bent.  Slightly point your toes inward on your back foot.  Keep your right / left heel on the floor, bend your back knee, and slightly shift your weight over the back leg so that you feel a gentle stretch deep in your back calf.  Hold this position for __________ seconds. Repeat __________  times. Complete this stretch __________ times per day. STRETCH - Gastrocsoleus, Standing  Note: This exercise can place a lot of stress on your foot and ankle. Please complete this exercise only if specifically instructed by your caregiver.   Place the ball of your right / left foot on a step, keeping your other foot firmly on the same step.  Hold on to the wall or a rail for balance.  Slowly lift your other foot, allowing your body weight to press your heel down over the edge of the step.  You should feel a stretch in your right / left calf.  Hold this position for __________ seconds.  Repeat this exercise with a slight bend in your right / left knee. Repeat __________ times. Complete this stretch __________ times per day.  STRENGTHENING EXERCISES - Plantar Fasciitis (Heel Spur Syndrome)  These exercises may help you when beginning to rehabilitate your injury. They may resolve your symptoms with or without further involvement from your physician, physical therapist or athletic trainer. While completing these  exercises, remember:   Muscles can gain both the endurance and the strength needed for everyday activities through controlled exercises.  Complete these exercises as instructed by your physician, physical therapist or athletic trainer. Progress the resistance and repetitions only as guided. STRENGTH - Towel Curls  Sit in a chair positioned on a non-carpeted surface.  Place your foot on a towel, keeping your heel on the floor.  Pull the towel toward your heel by only curling your toes. Keep your heel on the floor.  If instructed by your physician, physical therapist or athletic trainer, add ____________________ at the end of the towel. Repeat __________ times. Complete this exercise __________ times per day. STRENGTH - Ankle Inversion  Secure one end of a rubber exercise band/tubing to a fixed object (table, pole). Loop the other end around your foot just before your  toes.  Place your fists between your knees. This will focus your strengthening at your ankle.  Slowly, pull your big toe up and in, making sure the band/tubing is positioned to resist the entire motion.  Hold this position for __________ seconds.  Have your muscles resist the band/tubing as it slowly pulls your foot back to the starting position. Repeat __________ times. Complete this exercises __________ times per day.  Document Released: 01/06/2005 Document Revised: 03/31/2011 Document Reviewed: 04/20/2008 [facility] Patient Information 2015 [place], [place]. This information is not intended to replace advice given to you by your health care provider. Make sure you discuss any questions you have with your health care provider.

## 2014-07-12 NOTE — ED Notes (Signed)
Patient c/o right foot pain. No mechanism of injury
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
## 2014-08-03 ENCOUNTER — Other Ambulatory Visit: Payer: Self-pay

## 2014-08-03 MED ORDER — METFORMIN HCL 500 MG PO TABS: 500.0000 mg | ORAL_TABLET | Freq: Two times a day (BID) | ORAL | Status: DC

## 2014-08-21 ENCOUNTER — Encounter: Payer: Self-pay | Admitting: Emergency Medicine

## 2014-08-21 ENCOUNTER — Inpatient Hospital Stay
Admission: EM | Admit: 2014-08-21 | Discharge: 2014-08-23 | DRG: 684 | Disposition: A | Discharge status: Home / Self Care | Payer: Self-pay | Attending: Internal Medicine | Admitting: Internal Medicine

## 2014-08-21 ENCOUNTER — Emergency Department: Payer: Self-pay

## 2014-08-21 DIAGNOSIS — E669 Obesity, unspecified: Secondary | ICD-10-CM | POA: Diagnosis present

## 2014-08-21 DIAGNOSIS — Z833 Family history of diabetes mellitus: Secondary | ICD-10-CM

## 2014-08-21 DIAGNOSIS — Z6838 Body mass index (BMI) 38.0-38.9, adult: Secondary | ICD-10-CM

## 2014-08-21 DIAGNOSIS — Z955 Presence of coronary angioplasty implant and graft: Secondary | ICD-10-CM

## 2014-08-21 DIAGNOSIS — E785 Hyperlipidemia, unspecified: Secondary | ICD-10-CM | POA: Diagnosis present

## 2014-08-21 DIAGNOSIS — F17210 Nicotine dependence, cigarettes, uncomplicated: Secondary | ICD-10-CM | POA: Diagnosis present

## 2014-08-21 DIAGNOSIS — N170 Acute kidney failure with tubular necrosis: Principal | ICD-10-CM | POA: Diagnosis present

## 2014-08-21 DIAGNOSIS — E1165 Type 2 diabetes mellitus with hyperglycemia: Secondary | ICD-10-CM | POA: Diagnosis present

## 2014-08-21 DIAGNOSIS — E119 Type 2 diabetes mellitus without complications: Secondary | ICD-10-CM

## 2014-08-21 DIAGNOSIS — R739 Hyperglycemia, unspecified: Secondary | ICD-10-CM

## 2014-08-21 DIAGNOSIS — Z9889 Other specified postprocedural states: Secondary | ICD-10-CM

## 2014-08-21 DIAGNOSIS — J329 Chronic sinusitis, unspecified: Secondary | ICD-10-CM

## 2014-08-21 DIAGNOSIS — J45909 Unspecified asthma, uncomplicated: Secondary | ICD-10-CM | POA: Diagnosis present

## 2014-08-21 DIAGNOSIS — D72829 Elevated white blood cell count, unspecified: Secondary | ICD-10-CM | POA: Diagnosis present

## 2014-08-21 DIAGNOSIS — Z825 Family history of asthma and other chronic lower respiratory diseases: Secondary | ICD-10-CM

## 2014-08-21 DIAGNOSIS — Z79899 Other long term (current) drug therapy: Secondary | ICD-10-CM

## 2014-08-21 DIAGNOSIS — F17200 Nicotine dependence, unspecified, uncomplicated: Secondary | ICD-10-CM | POA: Diagnosis present

## 2014-08-21 DIAGNOSIS — E869 Volume depletion, unspecified: Secondary | ICD-10-CM | POA: Diagnosis present

## 2014-08-21 DIAGNOSIS — Z7951 Long term (current) use of inhaled steroids: Secondary | ICD-10-CM

## 2014-08-21 DIAGNOSIS — N179 Acute kidney failure, unspecified: Secondary | ICD-10-CM

## 2014-08-21 HISTORY — DX: Personal history of other diseases of the digestive system: Z87.19

## 2014-08-21 LAB — TROPONIN I: Troponin I: <0.03 ng/mL (ref <0.031)

## 2014-08-21 LAB — CBC
HCT: 47.9 % (ref 40.0–52.0)
Hemoglobin: 16.2 g/dL (ref 13.0–18.0)
MCH: 30.5 pg (ref 26.0–34.0)
MCHC: 33.8 g/dL (ref 32.0–36.0)
MCV: 90.3 fL (ref 80.0–100.0)
Platelets: 218 10*3/uL (ref 150–440)
RBC: 5.31 MIL/uL (ref 4.40–5.90)
RDW: 13.6 % (ref 11.5–14.5)
WBC: 16.2 10*3/uL — ABNORMAL HIGH (ref 3.8–10.6)

## 2014-08-21 LAB — BASIC METABOLIC PANEL
Anion gap: 13 (ref 5–15)
BUN: 26 mg/dL — ABNORMAL HIGH (ref 6–20)
CO2: 21 mmol/L — ABNORMAL LOW (ref 22–32)
Calcium: 9.6 mg/dL (ref 8.9–10.3)
Chloride: 101 mmol/L (ref 101–111)
Creatinine, Ser: 1.98 mg/dL — ABNORMAL HIGH (ref 0.61–1.24)
GFR calc Af Amer: 47 mL/min — ABNORMAL LOW (ref >60)
GFR calc non Af Amer: 41 mL/min — ABNORMAL LOW (ref >60)
Glucose, Bld: 294 mg/dL — ABNORMAL HIGH (ref 65–99)
Potassium: 4.3 mmol/L (ref 3.5–5.1)
Sodium: 135 mmol/L (ref 135–145)

## 2014-08-21 LAB — URINALYSIS COMPLETE WITH MICROSCOPIC (ARMC ONLY)
Bacteria, UA: NONE SEEN
Bilirubin Urine: NEGATIVE
Glucose, UA: >500 mg/dL — AB
Hgb urine dipstick: NEGATIVE
Ketones, ur: NEGATIVE mg/dL
Nitrite: NEGATIVE
Protein, ur: 100 mg/dL — AB
RBC / HPF: NONE SEEN RBC/hpf (ref 0–5)
Specific Gravity, Urine: 1.024 (ref 1.005–1.030)
pH: 5 (ref 5.0–8.0)

## 2014-08-21 LAB — GLUCOSE, CAPILLARY
Glucose-Capillary: 283 mg/dL — ABNORMAL HIGH (ref 65–99)
Glucose-Capillary: 234 mg/dL — ABNORMAL HIGH (ref 65–99)

## 2014-08-21 LAB — CK: Total CK: 189 U/L (ref 49–397)

## 2014-08-21 MED ORDER — SODIUM CHLORIDE 0.9 % IV BOLUS (SEPSIS)
1000.0000 mL | Freq: Once | INTRAVENOUS | Status: AC
  Administered 2014-08-21: 1000 mL via INTRAVENOUS

## 2014-08-21 MED ORDER — NICOTINE 10 MG IN INHA
1.0000 | RESPIRATORY_TRACT | Status: DC | PRN
  Administered 2014-08-21: 1 via RESPIRATORY_TRACT
  Filled 2014-08-21: qty 36

## 2014-08-21 NOTE — ED Notes (Signed)
Pt reports he works outside, has been feeling more thirsty than normal today, appears diaphoretic, states he is weak and dizzy. He checked his  blood glucose was 360, hasn't eaten any food all day today. Note pt at time of assessment only c/o aching all over 4/10 and cold denies any other s/s. Will continue to monitor.
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
## 2014-08-21 NOTE — ED Notes (Signed)
Pt works outside, has been feeling more thirsty than normal today, appears diaphoretic, states he is weak and dizzy. This afternoon, blood glucose was 360, hasn't eaten any food all day today.

## 2014-08-22 ENCOUNTER — Encounter: Payer: Self-pay | Admitting: Internal Medicine

## 2014-08-22 ENCOUNTER — Inpatient Hospital Stay: Payer: Self-pay

## 2014-08-22 LAB — GLUCOSE, CAPILLARY
Glucose-Capillary: 232 mg/dL — ABNORMAL HIGH (ref 65–99)
Glucose-Capillary: 218 mg/dL — ABNORMAL HIGH (ref 65–99)
Glucose-Capillary: 231 mg/dL — ABNORMAL HIGH (ref 65–99)
Glucose-Capillary: 270 mg/dL — ABNORMAL HIGH (ref 65–99)
Glucose-Capillary: 182 mg/dL — ABNORMAL HIGH (ref 65–99)

## 2014-08-22 LAB — BASIC METABOLIC PANEL
Anion gap: 7 (ref 5–15)
BUN: 22 mg/dL — ABNORMAL HIGH (ref 6–20)
CO2: 24 mmol/L (ref 22–32)
Calcium: 8.6 mg/dL — ABNORMAL LOW (ref 8.9–10.3)
Chloride: 105 mmol/L (ref 101–111)
Creatinine, Ser: 0.84 mg/dL (ref 0.61–1.24)
GFR calc Af Amer: >60 mL/min (ref >60)
GFR calc non Af Amer: >60 mL/min (ref >60)
Glucose, Bld: 289 mg/dL — ABNORMAL HIGH (ref 65–99)
Potassium: 4.4 mmol/L (ref 3.5–5.1)
Sodium: 136 mmol/L (ref 135–145)

## 2014-08-22 LAB — HEMOGLOBIN A1C: Hgb A1c MFr Bld: 10.9 % — ABNORMAL HIGH (ref 4.0–6.0)

## 2014-08-22 LAB — TSH: TSH: 2.689 u[IU]/mL (ref 0.350–4.500)

## 2014-08-22 MED ORDER — CYCLOBENZAPRINE HCL 10 MG PO TABS
5.0000 mg | ORAL_TABLET | Freq: Three times a day (TID) | ORAL | Status: DC | PRN
  Administered 2014-08-22: 5 mg via ORAL
  Filled 2014-08-22: qty 1

## 2014-08-22 MED ORDER — DOCUSATE SODIUM 100 MG PO CAPS
100.0000 mg | ORAL_CAPSULE | Freq: Two times a day (BID) | ORAL | Status: DC
  Administered 2014-08-22 – 2014-08-23 (×3): 100 mg via ORAL
  Filled 2014-08-22 (×3): qty 1

## 2014-08-22 MED ORDER — AMPICILLIN-SULBACTAM SODIUM 3 (2-1) G IJ SOLR
3.0000 g | Freq: Four times a day (QID) | INTRAMUSCULAR | Status: DC
  Administered 2014-08-22 – 2014-08-23 (×5): 3 g via INTRAVENOUS
  Filled 2014-08-22 (×11): qty 3

## 2014-08-22 MED ORDER — SODIUM CHLORIDE 0.9 % IV SOLN
INTRAVENOUS | Status: DC
  Administered 2014-08-22 – 2014-08-23 (×4): via INTRAVENOUS

## 2014-08-22 MED ORDER — ONDANSETRON HCL 4 MG PO TABS: 4.0000 mg | ORAL_TABLET | Freq: Four times a day (QID) | ORAL | Status: DC | PRN

## 2014-08-22 MED ORDER — ONDANSETRON HCL 4 MG/2ML IJ SOLN: 4.0000 mg | Freq: Four times a day (QID) | INTRAMUSCULAR | Status: DC | PRN

## 2014-08-22 MED ORDER — LIVING WELL WITH DIABETES BOOK
Freq: Once | Status: AC
  Administered 2014-08-22: 10:00:00
  Filled 2014-08-22: qty 1

## 2014-08-22 MED ORDER — SODIUM CHLORIDE 0.9 % IJ SOLN: 3.0000 mL | Freq: Two times a day (BID) | INTRAMUSCULAR | Status: DC

## 2014-08-22 MED ORDER — INSULIN GLARGINE 100 UNIT/ML ~~LOC~~ SOLN
10.0000 [IU] | Freq: Every day | SUBCUTANEOUS | Status: DC
  Administered 2014-08-22 – 2014-08-23 (×2): 10 [IU] via SUBCUTANEOUS
  Filled 2014-08-22 (×3): qty 0.1

## 2014-08-22 MED ORDER — INSULIN ASPART 100 UNIT/ML ~~LOC~~ SOLN: 0.0000 [IU] | Freq: Every day | SUBCUTANEOUS | Status: DC

## 2014-08-22 MED ORDER — ACETAMINOPHEN 650 MG RE SUPP: 650.0000 mg | Freq: Four times a day (QID) | RECTAL | Status: DC | PRN

## 2014-08-22 MED ORDER — ALBUTEROL SULFATE (2.5 MG/3ML) 0.083% IN NEBU: 3.0000 mL | INHALATION_SOLUTION | Freq: Four times a day (QID) | RESPIRATORY_TRACT | Status: DC | PRN

## 2014-08-22 MED ORDER — ACETAMINOPHEN 325 MG PO TABS
650.0000 mg | ORAL_TABLET | Freq: Four times a day (QID) | ORAL | Status: DC | PRN
  Administered 2014-08-22: 650 mg via ORAL
  Filled 2014-08-22: qty 2

## 2014-08-22 MED ORDER — INSULIN ASPART 100 UNIT/ML ~~LOC~~ SOLN
0.0000 [IU] | Freq: Three times a day (TID) | SUBCUTANEOUS | Status: DC
  Administered 2014-08-22: 5 [IU] via SUBCUTANEOUS
  Administered 2014-08-22: 8 [IU] via SUBCUTANEOUS
  Administered 2014-08-22: 5 [IU] via SUBCUTANEOUS
  Administered 2014-08-23: 3 [IU] via SUBCUTANEOUS
  Filled 2014-08-22: qty 5
  Filled 2014-08-22: qty 8
  Filled 2014-08-22: qty 3
  Filled 2014-08-22: qty 5

## 2014-08-22 MED ORDER — HEPARIN SODIUM (PORCINE) 5000 UNIT/ML IJ SOLN
5000.0000 [IU] | Freq: Three times a day (TID) | INTRAMUSCULAR | Status: DC
  Administered 2014-08-22 – 2014-08-23 (×4): 5000 [IU] via SUBCUTANEOUS
  Filled 2014-08-22 (×4): qty 1

## 2014-08-22 NOTE — Progress Notes (Signed)
Inpatient Diabetes Program Recommendations  AACE/ADA: New Consensus Statement on Inpatient Glycemic Control (2013)  Target Ranges:  Prepandial:   less than 140 mg/dL      Peak postprandial:   less than 180 mg/dL (1-2 hours)      Critically ill patients:  140 - 180 mg/dL  Results for Calvin, Butler (MRN [id number]) as of 08/22/2014 10:09  Ref. Range 08/21/2014 18:05 08/21/2014 19:59 08/22/2014 02:36 08/22/2014 08:07  Glucose-Capillary Latest Ref Range: 65-99 mg/dL 578 (H) 469 (H) 629 (H) 218 (H)   Inpatient Diabetes Program Recommendations Insulin - Basal: add Lantus 10 units  HgbA1C: pending Thank you  Piedad Climes BSN, RN,CDE Inpatient Diabetes Coordinator [phone] (team pager)

## 2014-08-22 NOTE — Care Management Note (Signed)
Case Management Note  Patient Details  Name: Calvin Butler MRN: [id number] Date of Birth: [birth year]  Subjective/Objective:                   Met with patient and his wife to discuss discharge planning. Patient is listed as self-pay pending being put on his new-wife's insurance plan through [employer]. He states his PCP is Dr. Ronette Deter at [facility] and he uses [facility] for Rx. He denies issues paying for Rx. He states he is NOT on insulin which can be quite expensive. He has a working glucometer and admits to not using it like he should- his wife has encouraged him to take care of his diabetes. I explained the dangers of organ failure with uncontrolled diabetes; patient admitted with kidney injury.  Action/Plan: No RNCM needs. Case closed.   Expected Discharge Date:                  Expected Discharge Plan:     In-House Referral:     Discharge planning Services  CM Consult  Post Acute Care Choice:    Choice offered to:  Patient, Spouse  DME Arranged:  N/A DME Agency:  NA  HH Arranged:  NA HH Agency:  NA  Status of Service:  Completed, signed off  Medicare Important Message Given:    Date Medicare IM Given:    Medicare IM give by:    Date Additional Medicare IM Given:    Additional Medicare Important Message give by:     If discussed at [place] of Stay Meetings, dates discussed:    Additional Comments:  Marshell Garfinkel, RN 08/22/2014, 9:31 AM

## 2014-08-22 NOTE — Progress Notes (Signed)
Inpatient Diabetes Program Recommendations  AACE/ADA: New Consensus Statement on Inpatient Glycemic Control (2013)  Target Ranges:  Prepandial:   less than 140 mg/dL      Peak postprandial:   less than 180 mg/dL (1-2 hours)      Critically ill patients:  140 - 180 mg/dL   Inpatient Diabetes Program Recommendations Insulin - Basal: ... HgbA1C: 10.9 Outpatient Referral: will need outpatient education once he is added to his wife's insurance  This coordinator met with patient and his wife to discuss basic DM patho and management.  Discussed basic carb counting and balanced meals.  Pt given Diabetes Meal Planning Guide.  Pt has not been monitoring his CBGs at home so encouraged daily monitoring.  Discussed lifestyle modification through healthy eating and exercise.  Pt and wife appreciative of visit and no further questions/concerns at the end of our visit.   Thank you  Raoul Pitch BSN, RN,CDE Inpatient Diabetes Coordinator [phone] (team pager)

## 2014-08-22 NOTE — Progress Notes (Signed)
Initial Nutrition Assessment  INTERVENTION:   Meals and Snacks: Cater to patient preferences within current diet order Education:  RD provided "Carbohydrate Counting for People with Diabetes" handout from the Academy of Nutrition and Dietetics. Discussed different food groups and their effects on blood sugar, emphasizing carbohydrate-containing foods. Provided list of carbohydrates and recommended serving sizes of common foods. RD also educated pt on reading food labels to aid in counting servings of carbohydrates. Discussed importance of controlled and consistent carbohydrate intake throughout the day. Provided examples of ways to balance meals/snacks and encouraged intake of high-fiber, whole grain complex carbohydrates. Teach back method used. Expect good compliance.   NUTRITION DIAGNOSIS:   Food and nutrition related knowledge deficit related to limited prior education as evidenced by per patient/family report.  GOAL:   Patient will meet greater than or equal to 90% of their needs  MONITOR:    (Energy Intake, Glucose Profile, Electrolyte and renal Profile)  REASON FOR ASSESSMENT:   Diagnosis    ASSESSMENT:   Pt admitted with acute kidney injury. Pt with recent diagnosis of DM 2 months ago per MD note.  Past Medical History  Diagnosis Date  . Asthma   . Diabetes mellitus without complication   . History of GI bleed     secondary to NSAIDs    Diet Order:  Diet heart healthy/carb modified Room service appropriate?: Yes; Fluid consistency:: Thin   Current Nutrition: Pt reports eating well this am eggs and toast.  Food/Nutrition-Related History: Pt reports good appetite PTA eating lunch and dinner daily. Pt reports usually not eating breakfast but then eating larger portions at lunch and dinner. Pt reports not having any nutrition education in regards to DM previously but that he has been drinking mostly water instead of Mt. Dew/tea and that he doesn't eat much sweet foods,  ie cakes, cookies, etc.  Medications: Novolog, NS at 120mL/hr  Electrolyte/Renal Profile and Glucose Profile:   Recent Labs Lab 08/21/14 1805 08/22/14 1003  NA 135 136  K 4.3 4.4  CL 101 105  CO2 21* 24  BUN 26* 22*  CREATININE 1.98* 0.84  CALCIUM 9.6 8.6*  GLUCOSE 294* 289*     Lab Results  Component Value Date   HGBA1C 11.0* 04/07/2014    Protein Profile: No results for input(s): ALBUMIN in the last 168 hours.   Skin:  Reviewed, no issues  Gastrointestinal Profile: Last BM: unknown   Weight Change: Per MST, no decrease in weight PTA. Per [facility] pt weight relatively stable the past 2 years 240-250lbs.  Height:   Ht Readings from Last 1 Encounters:  08/21/14  (1.727 m)    Weight:   Wt Readings from Last 1 Encounters:  08/22/14 245 lb 11.2 oz (111.449 kg)    Wt Readings from Last 10 Encounters:  08/22/14    245 lb 11.2 oz (111.449 kg)  07/12/14    250 lb (113.399 kg)  04/07/14    252 lb (114.306 kg)  12/08/13    242 lb (109.77 kg)  11/23/13    244 lb 8 oz (110.904 kg)  11/21/13    245 lb (111.131 kg)  06/29/12    233 lb 8 oz (105.915 kg)  04/23/12    239 lb (108.41 kg)  03/17/12    243 lb (110.224 kg)  02/25/12    239 lb (108.41 kg)  BMI:  Body mass index is 37.37 kg/(m^2).  EDUCATION NEEDS:   Education needs addressed   LOW Care Level  Dwyane Luo, [place], LDN Pager [phone]

## 2014-08-22 NOTE — H&P (Signed)
Calvin Butler is an 39 y.o. male.   Chief Complaint: Lightheadedness HPI: The patient presents emergency department complaining of shortness of breath and heart racing possibly 4 hours prior to arrival. Past mental history significant for diabetes mellitus type 2 diagnosed a few months ago. He states that he began to feel nauseous but did not vomit while working outside in the heat today. He denies any chest pain but admits to lightheadedness which is what prompted him to be evaluated in the emergency department. This blood sugar at that time his symptoms began and found to be 359. He admits that he drinks possibly 6 bottles of water today but continues to have a headache. The patient states that he takes ibuprofen daily for headaches and facial pain. Laboratory evaluation in the emergency department showed acute kidney injury and a leukocytosis which prompted emergency department to call for admission  Past Medical History  Diagnosis Date  . Asthma   . Diabetes mellitus without complication   . History of GI bleed     secondary to NSAIDs    Past Surgical History  Procedure Laterality Date  . Facial fracture surgery  at 39 yrs old    Right side face - due to accident  . Facial fracture surgery    . Percutaneous coronary stent intervention (pci-s)  2015    Family History  Problem Relation Age of Onset  . Aneurysm Mother     Brain  . Diabetes Father   . Asthma Sister   . Arthritis Daughter   . Diabetes Maternal Grandmother   . Prostate cancer Neg Hx   . Arthritis Daughter    Social History:  reports that he has been smoking Cigarettes.  He has a 27 pack-year smoking history. He has never used smokeless tobacco. He reports that he drinks alcohol. He reports that he does not use illicit drugs.  Allergies: No Known Allergies  Medications Prior to Admission  Medication Sig Dispense Refill  . albuterol (PROVENTIL HFA;VENTOLIN HFA) 108 (90 BASE) MCG/ACT inhaler Inhale 2 puffs into  the lungs every 6 (six) hours as needed for wheezing or shortness of breath. 1 Inhaler 2  . cyclobenzaprine (FLEXERIL) 5 MG tablet Take 1 tablet (5 mg total) by mouth 3 (three) times daily as needed for muscle spasms. 30 tablet 0  . ibuprofen (ADVIL,MOTRIN) 800 MG tablet Take 800 mg by mouth 2 (two) times daily.    . metFORMIN (GLUCOPHAGE) 500 MG tablet Take 1 tablet (500 mg total) by mouth 2 (two) times daily with a meal. 60 tablet 2  . ibuprofen (ADVIL,MOTRIN) 600 MG tablet Take 600 mg by mouth every 6 (six) hours as needed.    . meloxicam (MOBIC) 15 MG tablet Take 1 tablet (15 mg total) by mouth daily as needed for pain. 10 tablet 0  . traMADol (ULTRAM) 50 MG tablet Take 1 tablet (50 mg total) by mouth every 8 (eight) hours as needed (Do not drive or operate machinery while taking as can cause drowsiness.). 12 tablet 0  . varenicline (CHANTIX CONTINUING MONTH PAK) 1 MG tablet Take 1 tablet (1 mg total) by mouth 2 (two) times daily. 60 tablet 1  . varenicline (CHANTIX STARTING MONTH PAK) 0.5 MG X 11 & 1 MG X 42 tablet Take one 0.5 mg tablet by mouth once daily for 3 days, then increase to one 0.5 mg tablet twice daily for 4 days, then increase to one 1 mg tablet twice daily. 53 tablet 0  Results for orders placed or performed during the hospital encounter of 08/21/14 (from the past 48 hour(s))  Basic metabolic panel     Status: Abnormal   Collection Time: 08/21/14  6:05 PM  Result Value Ref Range   Sodium 135 135 - 145 mmol/L   Potassium 4.3 3.5 - 5.1 mmol/L   Chloride 101 101 - 111 mmol/L   CO2 21 (L) 22 - 32 mmol/L   Glucose, Bld 294 (H) 65 - 99 mg/dL   BUN 26 (H) 6 - 20 mg/dL   Creatinine, Ser 1.98 (H) 0.61 - 1.24 mg/dL   Calcium 9.6 8.9 - 10.3 mg/dL   GFR calc non Af Amer 41 (L) >60 mL/min   GFR calc Af Amer 47 (L) >60 mL/min    Comment: (NOTE) The eGFR has been calculated using the CKD EPI equation. This calculation has not been validated in all clinical situations. eGFR's  persistently <60 mL/min signify possible Chronic Kidney Disease.    Anion gap 13 5 - 15  CBC     Status: Abnormal   Collection Time: 08/21/14  6:05 PM  Result Value Ref Range   WBC 16.2 (H) 3.8 - 10.6 K/uL   RBC 5.31 4.40 - 5.90 MIL/uL   Hemoglobin 16.2 13.0 - 18.0 g/dL   HCT 47.9 40.0 - 52.0 %   MCV 90.3 80.0 - 100.0 fL   MCH 30.5 26.0 - 34.0 pg   MCHC 33.8 32.0 - 36.0 g/dL   RDW 13.6 11.5 - 14.5 %   Platelets 218 150 - 440 K/uL  Urinalysis complete, with microscopic ([facility] only)     Status: Abnormal   Collection Time: 08/21/14  6:05 PM  Result Value Ref Range   Color, Urine AMBER (A) YELLOW   APPearance CLOUDY (A) CLEAR   Glucose, UA >500 (A) NEGATIVE mg/dL   Bilirubin Urine NEGATIVE NEGATIVE   Ketones, ur NEGATIVE NEGATIVE mg/dL   Specific Gravity, Urine 1.024 1.005 - 1.030   Hgb urine dipstick NEGATIVE NEGATIVE   pH 5.0 5.0 - 8.0   Protein, ur 100 (A) NEGATIVE mg/dL   Nitrite NEGATIVE NEGATIVE   Leukocytes, UA TRACE (A) NEGATIVE   RBC / HPF NONE SEEN 0 - 5 RBC/hpf   WBC, UA 6-30 0 - 5 WBC/hpf   Bacteria, UA NONE SEEN NONE SEEN   Squamous Epithelial / LPF 0-5 (A) NONE SEEN   Mucous PRESENT    Hyaline Casts, UA PRESENT    Ca Oxalate Crys, UA PRESENT   Glucose, capillary     Status: Abnormal   Collection Time: 08/21/14  6:05 PM  Result Value Ref Range   Glucose-Capillary 283 (H) 65 - 99 mg/dL  CK     Status: None   Collection Time: 08/21/14  6:05 PM  Result Value Ref Range   Total CK 189 49 - 397 U/L  Troponin I     Status: None   Collection Time: 08/21/14  6:05 PM  Result Value Ref Range   Troponin I <0.03 <0.031 ng/mL    Comment:        NO INDICATION OF MYOCARDIAL INJURY.   TSH     Status: None   Collection Time: 08/21/14  6:05 PM  Result Value Ref Range   TSH 2.689 0.350 - 4.500 uIU/mL  Glucose, capillary     Status: Abnormal   Collection Time: 08/21/14  7:59 PM  Result Value Ref Range   Glucose-Capillary 234 (H) 65 - 99 mg/dL  Glucose, capillary  Status: Abnormal   Collection Time: 08/22/14  2:36 AM  Result Value Ref Range   Glucose-Capillary 232 (H) 65 - 99 mg/dL   Comment 1 Notify RN    Dg Chest Port 1 View  08/21/2014   CLINICAL DATA:  Initial encounter for weakness and dizziness with shortness of breath and chest pain beginning this afternoon.  EXAM: PORTABLE CHEST - 1 VIEW  COMPARISON:  12/09/2013.  FINDINGS: 2106 hours. The lungs are clear without focal infiltrate, edema, pneumothorax or pleural effusion. The cardiopericardial silhouette is within normal limits for size. Imaged bony structures of the thorax are intact.  IMPRESSION: Normal exam.   Electronically Signed   By: Misty Stanley M.D.   On: 08/21/2014 21:16   [place] Cm  08/22/2014   CLINICAL DATA:  Acute onset of right sinus pressure and fever. Assess for sinusitis. History of reconstructive surgery at the right side of the face. Initial encounter.  EXAM: CT PARANASAL SINUS LIMITED WITHOUT CONTRAST  TECHNIQUE: Non-contiguous multidetector CT images of the paranasal sinuses were obtained in a single plane without contrast.  COMPARISON:  CT of the head performed 08/07/2012  FINDINGS: There are sequelae of a blowout fracture involving the right orbit, with inferior displacement of the right orbital floor, and partial loss of the right ethmoid air cells. Mild stranding at both sites is thought to reflect chronic scarring. There is partial opacification of the right maxillary sinus with high attenuation material.  There is mild bowing of the right medial and inferior rectus muscles due to underlying fractures, without definite evidence for entrapment. The left orbit is unremarkable in appearance. No definite soft tissue abnormalities are characterized.  The visualized portions of the brain are unremarkable. The remaining paranasal sinuses and mastoid air cells are well-aerated.  IMPRESSION: 1. Partial opacification of the right maxillary sinus with high attenuation  material. 2. Sequelae of a blowout fracture involving the right orbit, with inferior displacement of the right orbital floor, partial loss of the right ethmoid air cells, and underlying apparent chronic soft tissue scarring. Mild bowing of the right medial and inferior rectus muscles, without definite evidence for entrapment. 3. Remaining paranasal sinuses and mastoid air cells are well-aerated.   Electronically Signed   By: Garald Balding M.D.   On: 08/22/2014 01:29    Review of Systems  Constitutional: Negative for fever and chills.  HENT: Negative for sore throat and tinnitus.   Eyes: Negative for blurred vision and redness.  Respiratory: Negative for cough and shortness of breath.   Cardiovascular: Negative for chest pain, palpitations, orthopnea and PND.  Gastrointestinal: Positive for nausea. Negative for vomiting, abdominal pain and diarrhea.  Genitourinary: Negative for dysuria, urgency and frequency.  Musculoskeletal: Negative for myalgias and joint pain.  Skin: Negative for rash.       No lesions  Neurological: Negative for speech change, focal weakness and weakness.  Endo/Heme/Allergies: Does not bruise/bleed easily.       No temperature intolerance  Psychiatric/Behavioral: Negative for depression and suicidal ideas.    Blood pressure 129/56, pulse 68, temperature 97.9 F (36.6 C), temperature source Oral, resp. rate 18, height _0  (1.727 m), weight 111.449 kg (245 lb 11.2 oz), SpO2 97 %. Physical Exam  Constitutional: He appears well-developed and well-nourished. No distress.  HENT:  Head: Normocephalic and atraumatic.  Mouth/Throat: Oropharynx is clear and moist.  Eyes: Conjunctivae and EOM are normal. Pupils are equal, round, and reactive to light. No scleral icterus.  Neck:  Normal range of motion. Neck supple. No JVD present. No tracheal deviation present. No thyromegaly present.  Cardiovascular: Normal rate and regular rhythm.  Exam reveals no gallop and no friction  rub.   No murmur heard. Respiratory: Effort normal and breath sounds normal. No respiratory distress.  GI: Soft. Bowel sounds are normal. He exhibits no distension. There is no tenderness.  Genitourinary:  Deferred  Musculoskeletal: Normal range of motion. He exhibits no edema.  Lymphadenopathy:    He has no cervical adenopathy.  Neurological: He is alert. No cranial nerve deficit.  Skin: Skin is warm. No rash noted. No erythema.  Psychiatric: He has a normal mood and affect. His behavior is normal. Judgment and thought content normal.     Assessment/Plan This is a 39 year old Hispanic male admitted for acute kidney injury. 1. Acute kidney injury: Avoid nephrotoxic drugs including ibuprofen. We will aggressively hydrate the patient with intravenous fluid as well. 2. Leukocytosis: The patient suffered traumatic injury to his right orbit many years ago and has recurrent sinusitis. I have ordered a CT scan of his head in the emergency department which demonstrates sinusitis yet again. This is likely the source of his leukocytosis. I have started the patient on Unasyn. He does not meet criteria for sepsis at this time. 3. Diabetes mellitus type 2: I have held the patient's metformin. He is on sliding scale insulin while hospitalized. Check hemoglobin A1c 4. Obesity: BMI is 38.1; encourage healthy diet and exercise 5. DVT prophylaxis: Heparin 6. GI prophylaxis: None The patient is a full code. Time spent on admission orders and patient care approximately 35 minutes  Harrie Foreman 08/22/2014, 6:01 AM

## 2014-08-22 NOTE — Progress Notes (Signed)
[facility] [place] [facility]   PATIENT NAME: Calvin Butler    MR#:  [id number]  DATE OF BIRTH:  03/13/1975  SUBJECTIVE:  CHIEF COMPLAINT:   Chief Complaint  Patient presents with  . Dizziness  . Hyperglycemia   Feeling much better. Still with some muscle aches.  REVIEW OF SYSTEMS:   Review of Systems  Constitutional: Negative for fever.  Respiratory: Negative for shortness of breath.   Cardiovascular: Negative for chest pain and palpitations.  Gastrointestinal: Negative for nausea, vomiting and abdominal pain.  Genitourinary: Negative for dysuria.    DRUG ALLERGIES:  No Known Allergies  VITALS:  Blood pressure 130/82, pulse 61, temperature 97.5 F (36.4 C), temperature source Oral, resp. rate 18, height 5\' 8"  (1.727 m), weight 111.449 kg (245 lb 11.2 oz), SpO2 99 %.  PHYSICAL EXAMINATION:  GENERAL:  39 y.o.-year-old patient lying in the bed with no acute distress. Obese EYES: Pupils equal, round, reactive to light and accommodation. No scleral icterus. Extraocular muscles intact.  HEENT: Head atraumatic, normocephalic. Oropharynx and nasopharynx clear. Mucous membranes are moist NECK:  Supple, no jugular venous distention. No thyroid enlargement, no tenderness.  LUNGS: Normal breath sounds bilaterally, no wheezing, rales,rhonchi or crepitation. No use of accessory muscles of respiration.  CARDIOVASCULAR: S1, S2 normal. No murmurs, rubs, or gallops.  ABDOMEN: Soft, nontender, nondistended. Bowel sounds present. No organomegaly or mass.  EXTREMITIES: No pedal edema, cyanosis, or clubbing.  PSYCHIATRIC: The patient is alert and oriented x 3.  SKIN: No obvious rash, lesion, or ulcer. Multiple tattoos   LABORATORY PANEL:   CBC  Recent Labs Lab 08/21/14 1805  WBC 16.2*  HGB 16.2  HCT 47.9  PLT 218   ------------------------------------------------------------------------------------------------------------------  Chemistries    Recent Labs Lab 08/22/14 1003  NA 136  K 4.4  CL 105  CO2 24  GLUCOSE 289*  BUN 22*  CREATININE 0.84  CALCIUM 8.6*   ------------------------------------------------------------------------------------------------------------------  Cardiac Enzymes  Recent Labs Lab 08/21/14 1805  TROPONINI <0.03   ------------------------------------------------------------------------------------------------------------------  RADIOLOGY:  Dg Chest Port 1 View  08/21/2014   CLINICAL DATA:  Initial encounter for weakness and dizziness with shortness of breath and chest pain beginning this afternoon.  EXAM: PORTABLE CHEST - 1 VIEW  COMPARISON:  12/09/2013.  FINDINGS: 2106 hours. The lungs are clear without focal infiltrate, edema, pneumothorax or pleural effusion. The cardiopericardial silhouette is within normal limits for size. Imaged bony structures of the thorax are intact.  IMPRESSION: Normal exam.   Electronically Signed   By: Kennith Center M.D.   On: 08/21/2014 21:16   Ct Maxillofacial  Ltd Wo Cm  08/22/2014   CLINICAL DATA:  Acute onset of right sinus pressure and fever. Assess for sinusitis. History of reconstructive surgery at the right side of the face. Initial encounter.  EXAM: CT PARANASAL SINUS LIMITED WITHOUT CONTRAST  TECHNIQUE: Non-contiguous multidetector CT images of the paranasal sinuses were obtained in a single plane without contrast.  COMPARISON:  CT of the head performed 08/07/2012  FINDINGS: There are sequelae of a blowout fracture involving the right orbit, with inferior displacement of the right orbital floor, and partial loss of the right ethmoid air cells. Mild stranding at both sites is thought to reflect chronic scarring. There is partial opacification of the right maxillary sinus with high attenuation material.  There is mild bowing of the right medial and inferior rectus muscles due to underlying fractures, without definite evidence for entrapment. The left orbit is  unremarkable  in appearance. No definite soft tissue abnormalities are characterized.  The visualized portions of the brain are unremarkable. The remaining paranasal sinuses and mastoid air cells are well-aerated.  IMPRESSION: 1. Partial opacification of the right maxillary sinus with high attenuation material. 2. Sequelae of a blowout fracture involving the right orbit, with inferior displacement of the right orbital floor, partial loss of the right ethmoid air cells, and underlying apparent chronic soft tissue scarring. Mild bowing of the right medial and inferior rectus muscles, without definite evidence for entrapment. 3. Remaining paranasal sinuses and mastoid air cells are well-aerated.   Electronically Signed   By: Roanna Raider M.D.   On: 08/22/2014 01:29    EKG:   Orders placed or performed during the hospital encounter of 08/21/14  . ED EKG  . ED EKG  . EKG 12-Lead  . EKG 12-Lead    ASSESSMENT AND PLAN:   1 acute renal failure: Due to ATN, volume depletion after working outside and also with hyperglycemia and auto diuresis. Improving with IV fluids. Continue hydration. Electrolytes are stable.  #2 diabetes mellitus type 2 uncontrolled: Check hemoglobin A1c. He is on sliding scale here and will also start Lantus 10 units tonight. He would prefer not to be placed on insulin as an outpatient. It is renal function improves he may be an appropriate candidate for metformin and glipizide. Diabetes education provided. He is motivated to make significant lifestyle changes.  #3 ongoing tobacco abuse: Counseling for smoking cessation provided for 5-10 minutes by me. He understands the significance of giving up smoking.  #4 obesity: BMI 38, discussed exercise and healthy diet.  All the records are reviewed and case discussed with Care Management/Social Workerr. Management plans discussed with the patient, family and they are in agreement.  CODE STATUS: Full  TOTAL TIME TAKING CARE OF THIS  PATIENT: 30 minutes.  Greater than 50% of time spent in care coordination and counseling. Care plan discussed at length with the patient. POSSIBLE D/C IN 1 DAYS, DEPENDING ON CLINICAL CONDITION.   Elby Showers M.D on 08/22/2014 at 1:30 PM  Between 7am to 6pm - Pager - [phone]  After 6pm go to [email or web address] - password [email or web address] [facility]  [facility]  Office  [phone]  CC: Primary care physician; Wynona Dove, MD

## 2014-08-23 ENCOUNTER — Telehealth: Payer: Self-pay

## 2014-08-23 DIAGNOSIS — J329 Chronic sinusitis, unspecified: Secondary | ICD-10-CM | POA: Diagnosis present

## 2014-08-23 DIAGNOSIS — N179 Acute kidney failure, unspecified: Secondary | ICD-10-CM | POA: Diagnosis present

## 2014-08-23 DIAGNOSIS — E119 Type 2 diabetes mellitus without complications: Secondary | ICD-10-CM

## 2014-08-23 LAB — GLUCOSE, CAPILLARY: Glucose-Capillary: 152 mg/dL — ABNORMAL HIGH (ref 65–99)

## 2014-08-23 LAB — LIPID PANEL
Cholesterol: 216 mg/dL — ABNORMAL HIGH (ref 0–200)
HDL: 22 mg/dL — ABNORMAL LOW (ref >40)
LDL Cholesterol: UNDETERMINED mg/dL (ref 0–99)
Total CHOL/HDL Ratio: 9.8 RATIO
Triglycerides: 842 mg/dL — ABNORMAL HIGH (ref <150)
VLDL: UNDETERMINED mg/dL (ref 0–40)

## 2014-08-23 LAB — BASIC METABOLIC PANEL
Anion gap: 6 (ref 5–15)
BUN: 17 mg/dL (ref 6–20)
CO2: 25 mmol/L (ref 22–32)
Calcium: 8.2 mg/dL — ABNORMAL LOW (ref 8.9–10.3)
Chloride: 108 mmol/L (ref 101–111)
Creatinine, Ser: 0.9 mg/dL (ref 0.61–1.24)
GFR calc Af Amer: >60 mL/min (ref >60)
GFR calc non Af Amer: >60 mL/min (ref >60)
Glucose, Bld: 184 mg/dL — ABNORMAL HIGH (ref 65–99)
Potassium: 4.3 mmol/L (ref 3.5–5.1)
Sodium: 139 mmol/L (ref 135–145)

## 2014-08-23 LAB — CBC
HCT: 44 % (ref 40.0–52.0)
Hemoglobin: 15 g/dL (ref 13.0–18.0)
MCH: 30.7 pg (ref 26.0–34.0)
MCHC: 34 g/dL (ref 32.0–36.0)
MCV: 90.3 fL (ref 80.0–100.0)
Platelets: 171 10*3/uL (ref 150–440)
RBC: 4.87 MIL/uL (ref 4.40–5.90)
RDW: 13.9 % (ref 11.5–14.5)
WBC: 10.3 10*3/uL (ref 3.8–10.6)

## 2014-08-23 MED ORDER — GLIPIZIDE 10 MG PO TABS: 10.0000 mg | ORAL_TABLET | Freq: Every day | ORAL | Status: DC

## 2014-08-23 MED ORDER — SIMVASTATIN 40 MG PO TABS: 40.0000 mg | ORAL_TABLET | Freq: Every day | ORAL | Status: DC

## 2014-08-23 MED ORDER — SULFAMETHOXAZOLE-TRIMETHOPRIM 800-160 MG PO TABS: 1.0000 | ORAL_TABLET | Freq: Two times a day (BID) | ORAL | Status: DC

## 2014-08-23 MED ORDER — METFORMIN HCL 500 MG PO TABS: 1000.0000 mg | ORAL_TABLET | Freq: Two times a day (BID) | ORAL | Status: DC

## 2014-08-23 NOTE — Discharge Summary (Signed)
[facility] [place] at [facility]  DISCHARGE SUMMARY   PATIENT NAME: Calvin Butler    MR#:  [id number]  DATE OF BIRTH:  07/09/1975  DATE OF ADMISSION:  08/21/2014 ADMITTING PHYSICIAN: Arnaldo Natal, MD  DATE OF DISCHARGE: 08/23/2014  PRIMARY CARE PHYSICIAN: Wynona Dove, MD    ADMISSION DIAGNOSIS:  Sinusitis [J32.9] Hyperglycemia [R73.9] Acute renal failure, unspecified acute renal failure type [N17.9]  DISCHARGE DIAGNOSIS:  Principal Problem:   Acute renal failure syndrome Active Problems:   Tobacco use disorder   Obesity   Hyperlipidemia   Diabetes   Sinusitis   SECONDARY DIAGNOSIS:   Past Medical History  Diagnosis Date  . Asthma   . Diabetes mellitus without complication   . History of GI bleed     secondary to NSAIDs    HOSPITAL COURSE:    1 acute renal failure: Due to ATN, volume depletion after working outside and also with hyperglycemia and auto diuresis. Improved. Renal function back to normal.  #2 diabetes mellitus type 2 uncontrolled: Hemoglobin A1c is 10.9. He should really be started on insulin but is very resistant to this. He would like to attempt lifestyle modification first. He is discharged on metformin 1000 g twice a day and glipizide 10 mg daily. He has received nutrition education during the hospitalization. He's been referred to the outpatient lifestyle clinic. He'll follow-up with his primary care provider   #3 ongoing tobacco abuse: Counseling for smoking cessation provided for 5-10 minutes by me. He understands the significance of giving up smoking.  #4 obesity: BMI 38, discussed exercise and healthy diet.  #5 hyperlipidemia: Simvastatin 20 mg daily started.  DISCHARGE CONDITIONS:   Stable  CONSULTS OBTAINED:     DRUG ALLERGIES:  No Known Allergies  DISCHARGE MEDICATIONS:   Discharge Medication List as of 08/23/2014 10:56 AM    START taking these medications   Details  glipiZIDE  (GLUCOTROL) 10 MG tablet Take 1 tablet (10 mg total) by mouth daily before breakfast., Starting 08/23/2014, Until Discontinued, Print    simvastatin (ZOCOR) 40 MG tablet Take 1 tablet (40 mg total) by mouth daily., Starting 08/23/2014, Until Discontinued, Normal    sulfamethoxazole-trimethoprim (BACTRIM DS,SEPTRA DS) 800-160 MG per tablet Take 1 tablet by mouth 2 (two) times daily., Starting 08/23/2014, Until Discontinued, Print      CONTINUE these medications which have CHANGED   Details  metFORMIN (GLUCOPHAGE) 500 MG tablet Take 2 tablets (1,000 mg total) by mouth 2 (two) times daily with a meal., Starting 08/23/2014, Until Discontinued, Print      CONTINUE these medications which have NOT CHANGED   Details  albuterol (PROVENTIL HFA;VENTOLIN HFA) 108 (90 BASE) MCG/ACT inhaler Inhale 2 puffs into the lungs every 6 (six) hours as needed for wheezing or shortness of breath., Starting 04/07/2014, Until Discontinued, Normal    cyclobenzaprine (FLEXERIL) 5 MG tablet Take 1 tablet (5 mg total) by mouth 3 (three) times daily as needed for muscle spasms., Starting 04/07/2014, Until Discontinued, Normal    varenicline (CHANTIX CONTINUING MONTH PAK) 1 MG tablet Take 1 tablet (1 mg total) by mouth 2 (two) times daily., Starting 12/08/2013, Until Discontinued, Normal    varenicline (CHANTIX STARTING MONTH PAK) 0.5 MG X 11 & 1 MG X 42 tablet Take one 0.5 mg tablet by mouth once daily for 3 days, then increase to one 0.5 mg tablet twice daily for 4 days, then increase to one 1 mg tablet twice daily., Print  STOP taking these medications     ibuprofen (ADVIL,MOTRIN) 800 MG tablet      ibuprofen (ADVIL,MOTRIN) 600 MG tablet      meloxicam (MOBIC) 15 MG tablet      traMADol (ULTRAM) 50 MG tablet          DISCHARGE INSTRUCTIONS:   Exercise- at least 15 minutes of fast walking daily.   DIET:  Diabetic diet and Low fat, Low cholesterol diet  DISCHARGE CONDITION:  Good  ACTIVITY:  Activity as  tolerated  OXYGEN:  Home Oxygen: No.   Oxygen Delivery: room air  DISCHARGE LOCATION:  home   If you experience worsening of your admission symptoms, develop shortness of breath, life threatening emergency, suicidal or homicidal thoughts you must seek medical attention immediately by calling 911 or calling your MD immediately  if symptoms less severe.  You Must read complete instructions/literature along with all the possible adverse reactions/side effects for all the Medicines you take and that have been prescribed to you. Take any new Medicines after you have completely understood and accpet all the possible adverse reactions/side effects.   Please note  You were cared for by a hospitalist during your hospital stay. If you have any questions about your discharge medications or the care you received while you were in the hospital after you are discharged, you can call the unit and asked to speak with the hospitalist on call if the hospitalist that took care of you is not available. Once you are discharged, your primary care physician will handle any further medical issues. Please note that NO REFILLS for any discharge medications will be authorized once you are discharged, as it is imperative that you return to your primary care physician (or establish a relationship with a primary care physician if you do not have one) for your aftercare needs so that they can reassess your need for medications and monitor your lab values.  Today   CHIEF COMPLAINT:   Chief Complaint  Patient presents with  . Dizziness  . Hyperglycemia    HISTORY OF PRESENT ILLNESS:  Chief Complaint: Lightheadedness HPI: The patient presents emergency department complaining of shortness of breath and heart racing possibly 4 hours prior to arrival. Past mental history significant for diabetes mellitus type 2 diagnosed a few months ago. He states that he began to feel nauseous but did not vomit while working outside in  the heat today. He denies any chest pain but admits to lightheadedness which is what prompted him to be evaluated in the emergency department. This blood sugar at that time his symptoms began and found to be 359. He admits that he drinks possibly 6 bottles of water today but continues to have a headache. The patient states that he takes ibuprofen daily for headaches and facial pain. Laboratory evaluation in the emergency department showed acute kidney injury and a leukocytosis which prompted emergency department to call for admission   VITAL SIGNS:  Blood pressure 126/75, pulse 58, temperature 97.9 F (36.6 C), temperature source Oral, resp. rate 14, height 5\' 8"  (1.727 m), weight 112.038 kg (247 lb), SpO2 97 %.  I/O:   Intake/Output Summary (Last 24 hours) at 08/23/14 1319 Last data filed at 08/23/14 0829  Gross per 24 hour  Intake 3775.17 ml  Output   1000 ml  Net 2775.17 ml    PHYSICAL EXAMINATION:  GENERAL:  39 y.o.-year-old patient lying in the bed with no acute distress. Obese EYES: Pupils equal, round, reactive to light and  accommodation. No scleral icterus. Extraocular muscles intact.  HEENT: Head atraumatic, normocephalic. Oropharynx and nasopharynx clear.  NECK:  Supple, no jugular venous distention. No thyroid enlargement, no tenderness.  LUNGS: Normal breath sounds bilaterally, no wheezing, rales,rhonchi or crepitation. No use of accessory muscles of respiration.  CARDIOVASCULAR: S1, S2 normal. No murmurs, rubs, or gallops.  ABDOMEN: Soft, non-tender, non-distended. Bowel sounds present. No organomegaly or mass.  EXTREMITIES: No pedal edema, cyanosis, or clubbing.  NEUROLOGIC: Cranial nerves II through XII are intact. Muscle strength 5/5 in all extremities. Sensation intact. Gait not checked.  PSYCHIATRIC: The patient is alert and oriented x 3.  SKIN: No obvious rash, lesion, or ulcer.   DATA REVIEW:   CBC  Recent Labs Lab 08/23/14 0607  WBC 10.3  HGB 15.0  HCT 44.0   PLT 171    Chemistries   Recent Labs Lab 08/23/14 0607  NA 139  K 4.3  CL 108  CO2 25  GLUCOSE 184*  BUN 17  CREATININE 0.90  CALCIUM 8.2*    Cardiac Enzymes  Recent Labs Lab 08/21/14 1805  TROPONINI <0.03   Lab Results  Component Value Date   CHOL 216* 08/23/2014   HDL 22* 08/23/2014   LDLCALC UNABLE TO CALCULATE IF TRIGLYCERIDE OVER 400 mg/dL 78/29/5621   LDLDIRECT 95.7 05/23/2011   TRIG 842* 08/23/2014   CHOLHDL 9.8 08/23/2014   Lab Results  Component Value Date   HGBA1C 10.9* 08/21/2014    Microbiology Results  No results found for this or any previous visit.  RADIOLOGY:  Dg Chest Port 1 View  08/21/2014   CLINICAL DATA:  Initial encounter for weakness and dizziness with shortness of breath and chest pain beginning this afternoon.  EXAM: PORTABLE CHEST - 1 VIEW  COMPARISON:  12/09/2013.  FINDINGS: 2106 hours. The lungs are clear without focal infiltrate, edema, pneumothorax or pleural effusion. The cardiopericardial silhouette is within normal limits for size. Imaged bony structures of the thorax are intact.  IMPRESSION: Normal exam.   Electronically Signed   By: Kennith Center M.D.   On: 08/21/2014 21:16   Ct Maxillofacial  Ltd Wo Cm  08/22/2014   CLINICAL DATA:  Acute onset of right sinus pressure and fever. Assess for sinusitis. History of reconstructive surgery at the right side of the face. Initial encounter.  EXAM: CT PARANASAL SINUS LIMITED WITHOUT CONTRAST  TECHNIQUE: Non-contiguous multidetector CT images of the paranasal sinuses were obtained in a single plane without contrast.  COMPARISON:  CT of the head performed 08/07/2012  FINDINGS: There are sequelae of a blowout fracture involving the right orbit, with inferior displacement of the right orbital floor, and partial loss of the right ethmoid air cells. Mild stranding at both sites is thought to reflect chronic scarring. There is partial opacification of the right maxillary sinus with high attenuation  material.  There is mild bowing of the right medial and inferior rectus muscles due to underlying fractures, without definite evidence for entrapment. The left orbit is unremarkable in appearance. No definite soft tissue abnormalities are characterized.  The visualized portions of the brain are unremarkable. The remaining paranasal sinuses and mastoid air cells are well-aerated.  IMPRESSION: 1. Partial opacification of the right maxillary sinus with high attenuation material. 2. Sequelae of a blowout fracture involving the right orbit, with inferior displacement of the right orbital floor, partial loss of the right ethmoid air cells, and underlying apparent chronic soft tissue scarring. Mild bowing of the right medial and inferior rectus muscles, without  definite evidence for entrapment. 3. Remaining paranasal sinuses and mastoid air cells are well-aerated.   Electronically Signed   By: Roanna Raider M.D.   On: 08/22/2014 01:29    EKG:   Orders placed or performed during the hospital encounter of 08/21/14  . ED EKG  . ED EKG  . EKG 12-Lead  . EKG 12-Lead      Management plans discussed with the patient, family and they are in agreement.  CODE STATUS:     Code Status Orders        Start     Ordered   08/22/14 0120  Full code   Continuous     08/22/14 0119      TOTAL TIME TAKING CARE OF THIS PATIENT: 35 minutes.  Greater than 50% of time spent in care coordination and counseling.  Elby Showers M.D on 08/23/2014 at 1:19 PM  Between 7am to 6pm - Pager - [phone]  After 6pm go to [email or web address] - password [email or web address] [facility]  [facility]  Office  [phone]  CC: Primary care physician; Wynona Dove, MD

## 2014-08-23 NOTE — Discharge Instructions (Signed)
Exercise- at least 15 minutes of fast walking daily.   DIET:  Diabetic diet and Low fat, Low cholesterol diet  DISCHARGE CONDITION:  Good  ACTIVITY:  Activity as tolerated  OXYGEN:  Home Oxygen: No.   Oxygen Delivery: room air  DISCHARGE LOCATION:  home   If you experience worsening of your admission symptoms, develop shortness of breath, life threatening emergency, suicidal or homicidal thoughts you must seek medical attention immediately by calling 911 or calling your MD immediately  if symptoms less severe.  You Must read complete instructions/literature along with all the possible adverse reactions/side effects for all the Medicines you take and that have been prescribed to you. Take any new Medicines after you have completely understood and accpet all the possible adverse reactions/side effects.   Please note  You were cared for by a hospitalist during your hospital stay. If you have any questions about your discharge medications or the care you received while you were in the hospital after you are discharged, you can call the unit and asked to speak with the hospitalist on call if the hospitalist that took care of you is not available. Once you are discharged, your primary care physician will handle any further medical issues. Please note that NO REFILLS for any discharge medications will be authorized once you are discharged, as it is imperative that you return to your primary care physician (or establish a relationship with a primary care physician if you do not have one) for your aftercare needs so that they can reassess your need for medications and monitor your lab values.

## 2014-08-23 NOTE — Progress Notes (Signed)
[facility]         [place], [place].   08/23/2014  Patient: Calvin Butler   Date of Birth:  08-Apr-1975  Date of admission:  08/21/2014  Date of Discharge  08/23/2014    To Whom it May Concern:   Chun Sellen  may return to work on 08/24/2014.  WORK-EMPLOYMENT:  Full Duty  If you have any questions or concerns, please don't hesitate to call.  Sincerely,   Elby Showers M.D Pager [phone] Office : [phone]   .

## 2014-08-23 NOTE — Progress Notes (Signed)
Pt. D/c'd to home. D/C instructions reviewed with pt. SL removed and tele box removed. Scripts given to pt. Pt. Has no further questions. Left via wheelchair.

## 2014-08-24 ENCOUNTER — Telehealth: Payer: Self-pay

## 2014-08-24 NOTE — Telephone Encounter (Signed)
Unable to reach patient.  TCM call attempt.

## 2014-08-24 NOTE — Telephone Encounter (Signed)
Transition Care Management Follow-up Telephone Call   Date discharged? 08/23/14   How have you been since you were released from the hospital? Doing ok. I am watching my blood sugars and was 190 after eating lunch.   Do you understand why you were in the hospital? Yes.   Do you understand the discharge instructions? Yes.   Where were you discharged to? Home.   Items Reviewed:  Medications reviewed: Yes  Allergies reviewed: Yes  Dietary changes reviewed: Yes  Referrals reviewed: Yes   Functional Questionnaire:   Activities of Daily Living (ADLs):   He states they are independent in the following: All ADLs States they require assistance with the following: No assistance required at this time.   Any transportation issues/concerns?: No   Any patient concerns? No health concerns. Currently does not have insurance. Cannot make follow up with PCP until has coverage.   Confirmed importance and date/time of follow-up visits scheduled Yes  Provider Appointment booked with no one at this time, per patient request.  Given Transitional Care Clinic phone number.  Confirmed with patient if condition begins to worsen call PCP or go to the ER.  Patient was given the office number and encouraged to call back with question or concerns.  :   Yes, patient verbalized understanding.

## 2014-10-23 ENCOUNTER — Other Ambulatory Visit: Payer: Self-pay

## 2014-10-23 NOTE — Telephone Encounter (Signed)
Last OV 05/03/12.  Has appointment scheduled on 10/27/14. Please advise refill?

## 2014-10-27 ENCOUNTER — Ambulatory Visit: Payer: Self-pay | Admitting: Internal Medicine

## 2014-10-27 ENCOUNTER — Other Ambulatory Visit: Payer: Self-pay | Admitting: *Deleted

## 2014-10-27 MED ORDER — METFORMIN HCL 500 MG PO TABS: 1000.0000 mg | ORAL_TABLET | Freq: Two times a day (BID) | ORAL | Status: DC

## 2014-11-17 ENCOUNTER — Ambulatory Visit (INDEPENDENT_AMBULATORY_CARE_PROVIDER_SITE_OTHER): Payer: Managed Care, Other (non HMO) | Admitting: Internal Medicine

## 2014-11-17 ENCOUNTER — Encounter: Payer: Self-pay | Admitting: Internal Medicine

## 2014-11-17 VITALS — BP 133/71 | HR 79 | Temp 98.1°F | Ht 68.0 in | Wt 251.1 lb

## 2014-11-17 DIAGNOSIS — N179 Acute kidney failure, unspecified: Secondary | ICD-10-CM

## 2014-11-17 DIAGNOSIS — M722 Plantar fascial fibromatosis: Secondary | ICD-10-CM | POA: Diagnosis not present

## 2014-11-17 DIAGNOSIS — E119 Type 2 diabetes mellitus without complications: Secondary | ICD-10-CM

## 2014-11-17 LAB — HEMOGLOBIN A1C
Hgb A1c MFr Bld: 8.9 % — ABNORMAL HIGH (ref <5.7)
Mean Plasma Glucose: 209 mg/dL — ABNORMAL HIGH (ref <117)

## 2014-11-17 MED ORDER — ALBUTEROL SULFATE HFA 108 (90 BASE) MCG/ACT IN AERS: 2.0000 | INHALATION_SPRAY | Freq: Four times a day (QID) | RESPIRATORY_TRACT | Status: DC | PRN

## 2014-11-17 MED ORDER — GLIPIZIDE 10 MG PO TABS: 10.0000 mg | ORAL_TABLET | Freq: Every day | ORAL | Status: DC

## 2014-11-17 MED ORDER — TRAMADOL HCL 50 MG PO TABS: 50.0000 mg | ORAL_TABLET | Freq: Three times a day (TID) | ORAL | Status: DC | PRN

## 2014-11-17 MED ORDER — SIMVASTATIN 40 MG PO TABS: 40.0000 mg | ORAL_TABLET | Freq: Every day | ORAL | Status: DC

## 2014-11-17 NOTE — Assessment & Plan Note (Signed)
Will repeat renal function with labs today. Reviewed notes from hospitalization.

## 2014-11-17 NOTE — Assessment & Plan Note (Signed)
Symptoms and exam c/w plantar fasciitis. Recommended icing foot, continued Ibuprofen, however cautioned about use of NSAIDs with history of renal failure. Will check renal function with labs. Start Tramadol as needed for severe pain. Referral to podiatry for evaluation.

## 2014-11-17 NOTE — Progress Notes (Signed)
Subjective:    Patient ID: Calvin Butler, male    DOB: 07/03/1975, 39 y.o.   MRN: [id number]  HPI  39YO male presents for follow up.  DM - Last seen 2014. Diagnosed with DM and found to have A1c of 10.9. Refused to start on Insulin and was started on metformin and Glipizide. Has been out of Glipizide. BG have been higher, near 200. On Glipizide, BG near 100.  Right foot plantar fascitis - Started in January. Initially had ankle pain. Had xray which was normal. Started on Flexeril and Ibuprofen. No improvement. Feels like knife in heel.   Wt Readings from Last 3 Encounters:  11/17/14 251 lb 2 oz (113.91 kg)  08/23/14 247 lb (112.038 kg)  07/12/14 250 lb (113.399 kg)   BP Readings from Last 3 Encounters:  11/17/14 133/71  08/23/14 126/75  07/12/14 113/59    Past Medical History  Diagnosis Date  . Asthma   . Diabetes mellitus without complication (HCC)   . History of GI bleed     secondary to NSAIDs   Family History  Problem Relation Age of Onset  . Aneurysm Mother     Brain  . Diabetes Father   . Asthma Sister   . Arthritis Daughter   . Diabetes Maternal Grandmother   . Prostate cancer Neg Hx   . Arthritis Daughter    Past Surgical History  Procedure Laterality Date  . Facial fracture surgery  at 39 yrs old    Right side face - due to accident  . Facial fracture surgery    . Percutaneous coronary stent intervention (pci-s)  2015   Social History   Social History  . Marital Status: Single    Spouse Name: N/A  . Number of Children: 2  . Years of Education: N/A   Occupational History  . Diesl Mechanic    Social History Main Topics  . Smoking status: Current Every Day Smoker -- 1.00 packs/day for 27 years    Types: Cigarettes  . Smokeless tobacco: Never Used  . Alcohol Use: Yes     Comment: Occasional  . Drug Use: No  . Sexual Activity: Not Asked   Other Topics Concern  . None   Social History Narrative   Lives in [place] with wife,  daughter, sister and 2 children. Dog in house. 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      Regular Exercise -  No   Daily Caffeine Use:  Occasional             Review of Systems  Constitutional: Negative for fever, chills, activity change, appetite change, fatigue and unexpected weight change.  Eyes: Negative for visual disturbance.  Respiratory: Negative for cough and shortness of breath.   Cardiovascular: Negative for chest pain, palpitations and leg swelling.  Gastrointestinal: Negative for nausea, vomiting, abdominal pain, diarrhea, constipation and abdominal distention.  Genitourinary: Negative for dysuria, urgency and difficulty urinating.  Musculoskeletal: Positive for arthralgias. Negative for myalgias and gait problem.  Skin: Negative for color change and rash.  Neurological: Negative for weakness.  Hematological: Negative for adenopathy.  Psychiatric/Behavioral: Negative for sleep disturbance and dysphoric mood. The patient is not nervous/anxious.        Objective:    BP 133/71 mmHg  Pulse 79  Temp(Src) 98.1 F (36.7 C) (Oral)  Ht  (1.727 m)  Wt 251 lb 2 oz (113.91 kg)  BMI 38.19 kg/m2  SpO2 96% Physical Exam  Constitutional: He is oriented to person,  place, and time. He appears well-developed and well-nourished. No distress.  HENT:  Head: Normocephalic and atraumatic.  Right Ear: External ear normal.  Left Ear: External ear normal.  Nose: Nose normal.  Mouth/Throat: Oropharynx is clear and moist. No oropharyngeal exudate.  Eyes: Conjunctivae and EOM are normal. Pupils are equal, round, and reactive to light. Right eye exhibits no discharge. Left eye exhibits no discharge. No scleral icterus.  Neck: Normal range of motion. Neck supple. No tracheal deviation present. No thyromegaly present.  Cardiovascular: Normal rate, regular rhythm and normal heart sounds.  Exam reveals no gallop and no friction rub.   No murmur heard. Pulmonary/Chest: Effort normal and breath  sounds normal. No accessory muscle usage. No tachypnea. No respiratory distress. He has no decreased breath sounds. He has no wheezes. He has no rhonchi. He has no rales. He exhibits no tenderness.  Musculoskeletal: Normal range of motion. He exhibits no edema.       Right foot: There is tenderness (plantar surface of heel). There is normal range of motion, no bony tenderness and no swelling.  Lymphadenopathy:    He has no cervical adenopathy.  Neurological: He is alert and oriented to person, place, and time. No cranial nerve deficit. Coordination normal.  Skin: Skin is warm and dry. No rash noted. He is not diaphoretic. No erythema. No pallor.  Psychiatric: He has a normal mood and affect. His behavior is normal. Judgment and thought content normal.          Assessment & Plan:   Problem List Items Addressed This Visit      Unprioritized   Acute renal failure syndrome (HCC)    Will repeat renal function with labs today. Reviewed notes from hospitalization.      Diabetes (HCC) - Primary (Chronic)    BG elevated, however pt out of Glipizide. Will check A1c with labs. Refills sent for Glipizide and Metformin. Follow up 4 weeks.      Relevant Medications   simvastatin (ZOCOR) 40 MG tablet   glipiZIDE (GLUCOTROL) 10 MG tablet   Other Relevant Orders   Comprehensive metabolic panel   Hemoglobin A1c   Lipid panel   Microalbumin / creatinine urine ratio   Plantar fasciitis of right foot    Symptoms and exam c/w plantar fasciitis. Recommended icing foot, continued Ibuprofen, however cautioned about use of NSAIDs with history of renal failure. Will check renal function with labs. Start Tramadol as needed for severe pain. Referral to podiatry for evaluation.      Relevant Medications   traMADol (ULTRAM) 50 MG tablet   Other Relevant Orders   Ambulatory referral to Podiatry       Return in about 4 weeks (around 12/15/2014) for Recheck.

## 2014-11-17 NOTE — Progress Notes (Signed)
Pre visit review using our clinic review tool, if applicable. No additional management support is needed unless otherwise documented below in the visit note. 

## 2014-11-17 NOTE — Assessment & Plan Note (Signed)
BG elevated, however pt out of Glipizide. Will check A1c with labs. Refills sent for Glipizide and Metformin. Follow up 4 weeks.

## 2014-11-17 NOTE — Patient Instructions (Signed)
Plantar Fasciitis Plantar fasciitis is a painful foot condition that affects the heel. It occurs when the band of tissue that connects the toes to the heel bone (plantar fascia) becomes irritated. This can happen after exercising too much or doing other repetitive activities (overuse injury). The pain from plantar fasciitis can range from mild irritation to severe pain that makes it difficult for you to walk or move. The pain is usually worse in the morning or after you have been sitting or lying down for a while. CAUSES This condition may be caused by:  Standing for long periods of time.  Wearing shoes that do not fit.  Doing high-impact activities, including running, aerobics, and ballet.  Being overweight.  Having an abnormal way of walking (gait).  Having tight calf muscles.  Having high arches in your feet.  Starting a new athletic activity. SYMPTOMS The main symptom of this condition is heel pain. Other symptoms include:  Pain that gets worse after activity or exercise.  Pain that is worse in the morning or after resting.  Pain that goes away after you walk for a few minutes. DIAGNOSIS This condition may be diagnosed based on your signs and symptoms. Your health care provider will also do a physical exam to check for:  A tender area on the bottom of your foot.  A high arch in your foot.  Pain when you move your foot.  Difficulty moving your foot. You may also need to have imaging studies to confirm the diagnosis. These can include:  X-rays.  Ultrasound.  MRI. TREATMENT  Treatment for plantar fasciitis depends on the severity of the condition. Your treatment may include:  Rest, ice, and over-the-counter pain medicines to manage your pain.  Exercises to stretch your calves and your plantar fascia.  A splint that holds your foot in a stretched, upward position while you sleep (night splint).  Physical therapy to relieve symptoms and prevent problems in the  future.  Cortisone injections to relieve severe pain.  Extracorporeal shock wave therapy (ESWT) to stimulate damaged plantar fascia with electrical impulses. It is often used as a last resort before surgery.  Surgery, if other treatments have not worked after 12 months. HOME CARE INSTRUCTIONS  Take medicines only as directed by your health care provider.  Avoid activities that cause pain.  Roll the bottom of your foot over a bag of ice or a bottle of cold water. Do this for 20 minutes, 3-4 times a day.  Perform simple stretches as directed by your health care provider.  Try wearing athletic shoes with air-sole or gel-sole cushions or soft shoe inserts.  Wear a night splint while sleeping, if directed by your health care provider.  Keep all follow-up appointments with your health care provider. PREVENTION   Do not perform exercises or activities that cause heel pain.  Consider finding low-impact activities if you continue to have problems.  Lose weight if you need to. The best way to prevent plantar fasciitis is to avoid the activities that aggravate your plantar fascia. SEEK MEDICAL CARE IF:  Your symptoms do not go away after treatment with home care measures.  Your pain gets worse.  Your pain affects your ability to move or do your daily activities.   This information is not intended to replace advice given to you by your health care provider. Make sure you discuss any questions you have with your health care provider.   Document Released: 10/01/2000 Document Revised: 09/27/2014 Document Reviewed: 11/16/2013 Elsevier   Interactive Patient Education 2016 Elsevier Inc.  

## 2014-11-18 LAB — COMPREHENSIVE METABOLIC PANEL
ALT: 35 U/L (ref 9–46)
AST: 20 U/L (ref 10–40)
Albumin: 4.1 g/dL (ref 3.6–5.1)
Alkaline Phosphatase: 83 U/L (ref 40–115)
BUN: 20 mg/dL (ref 7–25)
CO2: 24 mmol/L (ref 20–31)
Calcium: 9 mg/dL (ref 8.6–10.3)
Chloride: 101 mmol/L (ref 98–110)
Creat: 1.41 mg/dL — ABNORMAL HIGH (ref 0.60–1.35)
Glucose, Bld: 288 mg/dL — ABNORMAL HIGH (ref 65–99)
Potassium: 4.3 mmol/L (ref 3.5–5.3)
Sodium: 134 mmol/L — ABNORMAL LOW (ref 135–146)
Total Bilirubin: 0.2 mg/dL (ref 0.2–1.2)
Total Protein: 6.7 g/dL (ref 6.1–8.1)

## 2014-11-18 LAB — MICROALBUMIN / CREATININE URINE RATIO
Creatinine, Urine: 230 mg/dL (ref 20–370)
Microalb Creat Ratio: 1 mcg/mg creat (ref <30)
Microalb, Ur: 0.3 mg/dL

## 2014-11-18 LAB — LIPID PANEL
Cholesterol: 183 mg/dL (ref 125–200)
HDL: 20 mg/dL — ABNORMAL LOW (ref >=40)
Total CHOL/HDL Ratio: 9.2 Ratio — ABNORMAL HIGH (ref <=5.0)
Triglycerides: 1280 mg/dL — ABNORMAL HIGH (ref <150)

## 2014-11-29 ENCOUNTER — Ambulatory Visit: Payer: Managed Care, Other (non HMO) | Admitting: Podiatry

## 2014-12-22 ENCOUNTER — Ambulatory Visit (INDEPENDENT_AMBULATORY_CARE_PROVIDER_SITE_OTHER): Payer: Managed Care, Other (non HMO) | Admitting: Internal Medicine

## 2014-12-22 ENCOUNTER — Encounter: Payer: Self-pay | Admitting: Internal Medicine

## 2014-12-22 VITALS — BP 115/73 | HR 85 | Temp 98.0°F | Ht 68.0 in | Wt 243.5 lb

## 2014-12-22 DIAGNOSIS — N179 Acute kidney failure, unspecified: Secondary | ICD-10-CM | POA: Diagnosis not present

## 2014-12-22 DIAGNOSIS — E785 Hyperlipidemia, unspecified: Secondary | ICD-10-CM

## 2014-12-22 DIAGNOSIS — E119 Type 2 diabetes mellitus without complications: Secondary | ICD-10-CM | POA: Diagnosis not present

## 2014-12-22 DIAGNOSIS — M722 Plantar fascial fibromatosis: Secondary | ICD-10-CM

## 2014-12-22 LAB — COMPREHENSIVE METABOLIC PANEL
ALT: 38 U/L (ref 0–53)
AST: 21 U/L (ref 0–37)
Albumin: 4.3 g/dL (ref 3.5–5.2)
Alkaline Phosphatase: 66 U/L (ref 39–117)
BUN: 17 mg/dL (ref 6–23)
CO2: 22 mEq/L (ref 19–32)
Calcium: 9.3 mg/dL (ref 8.4–10.5)
Chloride: 107 mEq/L (ref 96–112)
Creatinine, Ser: 0.93 mg/dL (ref 0.40–1.50)
GFR: 95.57 mL/min (ref >60.00)
Glucose, Bld: 119 mg/dL — ABNORMAL HIGH (ref 70–99)
Potassium: 4.6 mEq/L (ref 3.5–5.1)
Sodium: 141 mEq/L (ref 135–145)
Total Bilirubin: 0.4 mg/dL (ref 0.2–1.2)
Total Protein: 7.3 g/dL (ref 6.0–8.3)

## 2014-12-22 LAB — LIPID PANEL
Cholesterol: 118 mg/dL (ref 0–200)
HDL: 28.2 mg/dL — ABNORMAL LOW (ref >39.00)
NonHDL: 90.1
Total CHOL/HDL Ratio: 4
Triglycerides: 353 mg/dL — ABNORMAL HIGH (ref 0.0–149.0)
VLDL: 70.6 mg/dL — ABNORMAL HIGH (ref 0.0–40.0)

## 2014-12-22 LAB — LDL CHOLESTEROL, DIRECT: Direct LDL: 52 mg/dL

## 2014-12-22 NOTE — Progress Notes (Addendum)
Subjective:    Patient ID: Calvin Butler, male    DOB: 09/13/1975, 39 y.o.   MRN: [id number]  HPI  39YO male presents for follow up.  Last seen in 10/2014. A1c was 8.9%. Triglycerides were extremely high at 1280. Started on Simvastatin 40mg  daily.  BG improved. However feels poorly if BG near 130s. Feels tired, hungry when BG in low 100s. Has limited intake of sweet tea and does not drink soda. Avoids desserts.    Wt Readings from Last 3 Encounters:  12/22/14 243 lb 8 oz (110.451 kg)  11/17/14 251 lb 2 oz (113.91 kg)  08/23/14 247 lb (112.038 kg)   BP Readings from Last 3 Encounters:  12/22/14 115/73  11/17/14 133/71  08/23/14 126/75    Past Medical History  Diagnosis Date  . Asthma   . Diabetes mellitus without complication (HCC)   . History of GI bleed     secondary to NSAIDs   Family History  Problem Relation Age of Onset  . Aneurysm Mother     Brain  . Diabetes Father   . Asthma Sister   . Arthritis Daughter   . Diabetes Maternal Grandmother   . Prostate cancer Neg Hx   . Arthritis Daughter    Past Surgical History  Procedure Laterality Date  . Facial fracture surgery  at 39 yrs old    Right side face - due to accident  . Facial fracture surgery    . Percutaneous coronary stent intervention (pci-s)  2015   Social History   Social History  . Marital Status: Single    Spouse Name: N/A  . Number of Children: 2  . Years of Education: N/A   Occupational History  . Diesl Mechanic    Social History Main Topics  . Smoking status: Current Every Day Smoker -- 1.00 packs/day for 27 years    Types: Cigarettes  . Smokeless tobacco: Never Used  . Alcohol Use: Yes     Comment: Occasional  . Drug Use: No  . Sexual Activity: Not Asked   Other Topics Concern  . None   Social History Narrative   Lives in [place] with wife, daughter, sister and 2 children. Dog in house. 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      Regular Exercise -  No   Daily Caffeine Use:   Occasional             Review of Systems  Constitutional: Negative for fever, chills, activity change, appetite change, fatigue and unexpected weight change.  Eyes: Negative for visual disturbance.  Respiratory: Negative for cough and shortness of breath.   Cardiovascular: Negative for chest pain, palpitations and leg swelling.  Gastrointestinal: Negative for nausea, vomiting, abdominal pain, diarrhea, constipation and abdominal distention.  Genitourinary: Negative for dysuria, urgency and difficulty urinating.  Musculoskeletal: Negative for arthralgias and gait problem.  Skin: Negative for color change and rash.  Hematological: Negative for adenopathy.  Psychiatric/Behavioral: Negative for suicidal ideas, sleep disturbance and dysphoric mood. The patient is not nervous/anxious.        Objective:    BP 115/73 mmHg  Pulse 85  Temp(Src) 98 F (36.7 C) (Oral)  Ht 5\' 8"  (1.727 m)  Wt 243 lb 8 oz (110.451 kg)  BMI 37.03 kg/m2  SpO2 96% Physical Exam  Constitutional: He is oriented to person, place, and time. He appears well-developed and well-nourished. No distress.  HENT:  Head: Normocephalic and atraumatic.  Right Ear: External ear normal.  Left Ear: External  ear normal.  Nose: Nose normal.  Mouth/Throat: Oropharynx is clear and moist. No oropharyngeal exudate.  Eyes: Conjunctivae and EOM are normal. Pupils are equal, round, and reactive to light. Right eye exhibits no discharge. Left eye exhibits no discharge. No scleral icterus.  Neck: Normal range of motion. Neck supple. No tracheal deviation present. No thyromegaly present.  Cardiovascular: Normal rate, regular rhythm and normal heart sounds.  Exam reveals no gallop and no friction rub.   No murmur heard. Pulmonary/Chest: Effort normal and breath sounds normal. No accessory muscle usage. No tachypnea. No respiratory distress. He has no decreased breath sounds. He has no wheezes. He has no rhonchi. He has no rales. He  exhibits no tenderness.  Musculoskeletal: Normal range of motion. He exhibits no edema.  Lymphadenopathy:    He has no cervical adenopathy.  Neurological: He is alert and oriented to person, place, and time. No cranial nerve deficit. Coordination normal.  Skin: Skin is warm and dry. No rash noted. He is not diaphoretic. No erythema. No pallor.  Psychiatric: He has a normal mood and affect. His behavior is normal. Judgment and thought content normal.          Assessment & Plan:  Over of which >50% spent in face-to-face contact with patient discussing plan of care  Problem List Items Addressed This Visit      Unprioritized   Acute renal failure syndrome (HCC)    Recent Cr elevated at 1.4. Will repeat renal function with labs today.      Diabetes (HCC) - Primary (Chronic)    BG improved on Glipizide. Will continue to monitor. Will set up nutrition evaluation to help him with dietary choices. Discussed avoiding processed sugars and discussed some hidden sugars in foods. Encouraged him to increase protein intake.      Relevant Orders   Ambulatory referral to diabetic education   Hyperlipidemia    Recent triglycerides extremely high. Started back on Simvastatin. Will repeat lipids today. Discussed risks of elevated triglycerides.      Relevant Orders   Comprehensive metabolic panel   Lipid panel   Plantar fasciitis of right foot    Will set up evaluation with [facility] podiatry.      Relevant Orders   Ambulatory referral to Podiatry       Return in about 2 months (around 02/22/2015).

## 2014-12-22 NOTE — Assessment & Plan Note (Signed)
Recent Cr elevated at 1.4. Will repeat renal function with labs today.

## 2014-12-22 NOTE — Progress Notes (Signed)
Pre visit review using our clinic review tool, if applicable. No additional management support is needed unless otherwise documented below in the visit note. 

## 2014-12-22 NOTE — Assessment & Plan Note (Signed)
Will set up evaluation with [facility] podiatry.

## 2014-12-22 NOTE — Assessment & Plan Note (Signed)
Recent triglycerides extremely high. Started back on Simvastatin. Will repeat lipids today. Discussed risks of elevated triglycerides.

## 2014-12-22 NOTE — Patient Instructions (Addendum)
Labs today.  We will set up nutrition evaluation.

## 2014-12-22 NOTE — Assessment & Plan Note (Signed)
BG improved on Glipizide. Will continue to monitor. Will set up nutrition evaluation to help him with dietary choices. Discussed avoiding processed sugars and discussed some hidden sugars in foods. Encouraged him to increase protein intake.

## 2014-12-25 ENCOUNTER — Telehealth: Payer: Self-pay | Admitting: *Deleted

## 2014-12-25 NOTE — Telephone Encounter (Signed)
Patient has requested lab results, from 12/22/14

## 2014-12-26 NOTE — Telephone Encounter (Signed)
Patient notified of lab results

## 2015-01-02 ENCOUNTER — Ambulatory Visit: Payer: Managed Care, Other (non HMO) | Admitting: *Deleted

## 2015-01-08 ENCOUNTER — Encounter: Payer: Managed Care, Other (non HMO) | Attending: Internal Medicine | Admitting: *Deleted

## 2015-01-08 ENCOUNTER — Encounter: Payer: Self-pay | Admitting: *Deleted

## 2015-01-08 VITALS — BP 120/76 | Ht 67.0 in | Wt 244.2 lb

## 2015-01-08 DIAGNOSIS — E119 Type 2 diabetes mellitus without complications: Secondary | ICD-10-CM | POA: Diagnosis present

## 2015-01-08 NOTE — Patient Instructions (Addendum)
Check blood sugars at least 1 x day before breakfast or 2 hrs after supper every day Exercise: Begin walking for 15  minutes 3  days a week and gradually increase to 150 minutes/week Eat 3 meals day, 1-2  snacks a day Space meals 4-6 hours apart Avoid fruit juices unless treating a low blood sugar Don't skip meals Quit smoking Bring blood sugar records to the next class Carry fast acting glucose and a snack at all times

## 2015-01-08 NOTE — Progress Notes (Signed)
Diabetes Self-Management Education  Visit Type: First/Initial  Appt. Start Time: 0850 Appt. End Time: 0945  01/08/2015  Mr. Calvin Butler, identified by name and date of birth, is a 39 y.o. male with a diagnosis of Diabetes: Type 2.   ASSESSMENT  Blood pressure 120/76, height 5\' 7"  (1.702 m), weight 244 lb 3.2 oz (110.768 kg). Body mass index is 38.24 kg/(m^2).      Diabetes Self-Management Education - 01/08/15 1021    Visit Information   Visit Type First/Initial   Initial Visit   Diabetes Type Type 2   Are you currently following a meal plan? No   Are you taking your medications as prescribed? Yes   Date Diagnosed 7 months ago   Psychosocial Assessment   Patient Belief/Attitude about Diabetes Other (comment)  "I don't care" that he has diabetes.    Self-care barriers None   Self-management support Doctor's office;Family   Patient Concerns Glycemic Control;Weight Control   Special Needs None   Preferred Learning Style Hands on   Learning Readiness Contemplating   How often do you need to have someone help you when you read instructions, pamphlets, or other written materials from your doctor or pharmacy? 1 - Never   What is the last grade level you completed in school? 8th   Complications   Last HgB A1C per patient/outside source 8.9 %  11/17/14   How often do you check your blood sugar? 0 times/day (not testing)  1 x month - last reading 2 weeks ago was 190 mg/dL at night.   Number of hypoglycemic episodes per month 1   Can you tell when your blood sugar is low? Yes   What do you do if your blood sugar is low? eat nuts   Have you had a dilated eye exam in the past 12 months? Yes   Have you had a dental exam in the past 12 months? No   Are you checking your feet? No   Dietary Intake   Breakfast skips   Lunch rarely eats but may have sub from [employer]   Snack (afternoon) nuts   Dinner baked or bbq chicken, mac-n-cheese, green beans, corn   Beverage(s) water, diet  beverages, occ fruit juice   Exercise   Exercise Type ADL's   Patient Education   Previous Diabetes Education No  Pt reports some education while in hospital last summer.   Disease state  Definition of diabetes, type 1 and 2, and the diagnosis of diabetes;Factors that contribute to the development of diabetes   Nutrition management  Role of diet in the treatment of diabetes and the relationship between the three main macronutrients and blood glucose level   Physical activity and exercise  Role of exercise on diabetes management, blood pressure control and cardiac health.   Medications Reviewed patients medication for diabetes, action, purpose, timing of dose and side effects.   Monitoring Purpose and frequency of SMBG.;Identified appropriate SMBG and/or A1C goals.   Acute complications Taught treatment of hypoglycemia - the 15 rule.   Chronic complications Relationship between chronic complications and blood glucose control   Psychosocial adjustment Identified and addressed patients feelings and concerns about diabetes   Personal strategies to promote health Review risk of smoking and offered smoking cessation   Individualized Goals (developed by patient)   Reducing Risk Improve blood sugars Lose weight Become more fit Quit smoking   Outcomes   Expected Outcomes Demonstrated limited interest in learning.  Expect minimal changes  Individualized Plan for Diabetes Self-Management Training:   Learning Objective:  Patient will have a greater understanding of diabetes self-management. Patient education plan is to attend individual and/or group sessions per assessed needs and concerns.   Plan:   Patient Instructions  Check blood sugars at least 1 x day before breakfast or 2 hrs after supper every day Exercise: Begin walking for 15  minutes 3  days a week and gradually increase to 150 minutes/week Eat 3 meals day, 1-2  snacks a day Space meals 4-6 hours apart Avoid fruit juices  unless treating a low blood sugar Don't skip meals Quit smoking Bring blood sugar records to the next class Carry fast acting glucose and a snack at all times  Expected Outcomes:  Demonstrated limited interest in learning.  Expect minimal changes  Education material provided:  General Meal Planning Guidelines Simple Meal Plan Glucose tablets Symptoms, causes and treatments of Hypoglycemia  If problems or questions, patient to contact team via:   Sharion Settler, RN, CCM, CDE [phone]  Future DSME appointment:  February 19, 2015 for Class 1. Pt just started a 2nd job.

## 2015-01-10 ENCOUNTER — Ambulatory Visit (INDEPENDENT_AMBULATORY_CARE_PROVIDER_SITE_OTHER): Payer: Managed Care, Other (non HMO) | Admitting: Family Medicine

## 2015-01-10 ENCOUNTER — Ambulatory Visit
Admission: RE | Admit: 2015-01-10 | Discharge: 2015-01-10 | Disposition: A | Discharge status: Home / Self Care | Payer: Managed Care, Other (non HMO) | Source: Ambulatory Visit | Attending: Family Medicine | Admitting: Family Medicine

## 2015-01-10 ENCOUNTER — Encounter: Payer: Self-pay | Admitting: Family Medicine

## 2015-01-10 VITALS — BP 122/74 | HR 90 | Temp 97.7°F | Ht 68.0 in | Wt 241.8 lb

## 2015-01-10 DIAGNOSIS — R1030 Lower abdominal pain, unspecified: Secondary | ICD-10-CM

## 2015-01-10 DIAGNOSIS — R1011 Right upper quadrant pain: Secondary | ICD-10-CM | POA: Insufficient documentation

## 2015-01-10 DIAGNOSIS — K760 Fatty (change of) liver, not elsewhere classified: Secondary | ICD-10-CM | POA: Insufficient documentation

## 2015-01-10 DIAGNOSIS — R1013 Epigastric pain: Secondary | ICD-10-CM | POA: Insufficient documentation

## 2015-01-10 DIAGNOSIS — R1031 Right lower quadrant pain: Secondary | ICD-10-CM | POA: Diagnosis not present

## 2015-01-10 DIAGNOSIS — R1010 Upper abdominal pain, unspecified: Secondary | ICD-10-CM | POA: Insufficient documentation

## 2015-01-10 LAB — COMPREHENSIVE METABOLIC PANEL
ALT: 44 U/L (ref 0–53)
AST: 30 U/L (ref 0–37)
Albumin: 4.2 g/dL (ref 3.5–5.2)
Alkaline Phosphatase: 59 U/L (ref 39–117)
BUN: 18 mg/dL (ref 6–23)
CO2: 26 mEq/L (ref 19–32)
Calcium: 9.4 mg/dL (ref 8.4–10.5)
Chloride: 105 mEq/L (ref 96–112)
Creatinine, Ser: 1.01 mg/dL (ref 0.40–1.50)
GFR: 86.86 mL/min (ref >60.00)
Glucose, Bld: 186 mg/dL — ABNORMAL HIGH (ref 70–99)
Potassium: 4 mEq/L (ref 3.5–5.1)
Sodium: 139 mEq/L (ref 135–145)
Total Bilirubin: 0.6 mg/dL (ref 0.2–1.2)
Total Protein: 7.3 g/dL (ref 6.0–8.3)

## 2015-01-10 LAB — POCT URINALYSIS DIPSTICK
Bilirubin, UA: NEGATIVE
Blood, UA: NEGATIVE
Glucose, UA: NEGATIVE
Ketones, UA: NEGATIVE
Leukocytes, UA: NEGATIVE
Nitrite, UA: NEGATIVE
Protein, UA: NEGATIVE
Spec Grav, UA: >=1.03
Urobilinogen, UA: 0.2
pH, UA: 5.5

## 2015-01-10 LAB — CBC
HCT: 49.1 % (ref 39.0–52.0)
Hemoglobin: 16.2 g/dL (ref 13.0–17.0)
MCHC: 32.9 g/dL (ref 30.0–36.0)
MCV: 90.8 fl (ref 78.0–100.0)
Platelets: 241 10*3/uL (ref 150.0–400.0)
RBC: 5.41 Mil/uL (ref 4.22–5.81)
RDW: 13.4 % (ref 11.5–15.5)
WBC: 11.3 10*3/uL — ABNORMAL HIGH (ref 4.0–10.5)

## 2015-01-10 LAB — LIPASE: Lipase: 31 U/L (ref 11.0–59.0)

## 2015-01-10 MED ORDER — PANTOPRAZOLE SODIUM 40 MG PO TBEC: 40.0000 mg | DELAYED_RELEASE_TABLET | Freq: Every day | ORAL | Status: DC

## 2015-01-10 MED ORDER — HYDROCODONE-ACETAMINOPHEN 5-325 MG PO TABS: 1.0000 | ORAL_TABLET | Freq: Four times a day (QID) | ORAL | Status: DC | PRN

## 2015-01-10 MED ORDER — IOHEXOL 300 MG/ML  SOLN
100.0000 mL | Freq: Once | INTRAMUSCULAR | Status: AC | PRN
Start: 2015-01-10 — End: 2015-01-10
  Administered 2015-01-10: 100 mL via INTRAVENOUS

## 2015-01-10 NOTE — Patient Instructions (Addendum)
It was nice to see you today.  We will call with your results.  Take the protonix and pain medication as directed.  Merry Christmas  Dr. Adriana Simasook

## 2015-01-10 NOTE — Progress Notes (Signed)
Pre visit review using our clinic review tool, if applicable. No additional management support is needed unless otherwise documented below in the visit note. 

## 2015-01-10 NOTE — Assessment & Plan Note (Signed)
New problem. Unclear etiology. MSK vs appendicitis vs diverticulitis. Sending for CT scan for further eval. Labs today: CBC, CMP, Lipase.

## 2015-01-10 NOTE — Progress Notes (Signed)
Subjective:  Patient ID: Calvin Butler, male    DOB: [birth year]  Age: 39 y.o. MRN: [id number]  CC: Abdominal pain  HPI:  39 year old male with past medical history of diabetes, tobacco abuse, obesity, hyperlipidemia, & GERD presents for acute visit with complaints of abdominal pain.  Abdominal pain  Patient reports that he's had right sided abdominal pain for the past week.  He reports some radiation to his back.  No associated nausea or vomiting. He reports he has had some loose stool and some dark-colored stool.  No associated fever or chills but he has felt quite warm.  Pain is slowly been worsening over the past week and is currently severe.  No known relieving factors.   He reports that his pain is exacerbated after food intake.  He denies excessive NSAID intake. No alcohol.  Social Hx   Social History   Social History  . Marital Status: Single    Spouse Name: N/A  . Number of Children: 2  . Years of Education: N/A   Occupational History  . [employer]    Social History Main Topics  . Smoking status: Current Every Day Smoker -- 1.00 packs/day for 27 years    Types: Cigarettes  . Smokeless tobacco: Never Used  . Alcohol Use: 0.0 oz/week    0 Standard drinks or equivalent per week     Comment: Occasional  . Drug Use: No  . Sexual Activity: Not Asked   Other Topics Concern  . None   Social History Narrative   Lives in [place] with wife, daughter, sister and 2 children. Dog in house. 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      Regular Exercise -  No   Daily Caffeine Use:  Occasional            Review of Systems  Constitutional:       Subjective fever.  Gastrointestinal: Positive for abdominal pain. Negative for nausea and vomiting.   Objective:  BP 122/74 mmHg  Pulse 90  Temp(Src) 97.7 F (36.5 C) (Oral)  Ht _0  (1.727 m)  Wt 241 lb 12 oz (109.657 kg)  BMI 36.77 kg/m2  SpO2 93%  BP/Weight 01/10/2015 01/08/2015 52/08/4130  Systolic BP  440 102 725  Diastolic BP 74 76 73  Wt. (Lbs) 241.75 244.2 243.5  BMI 36.77 38.24 37.03   Physical Exam  Constitutional: He appears well-developed. No distress.  HENT:  Head: Normocephalic and atraumatic.  Eyes: Conjunctivae are normal. No scleral icterus.  Cardiovascular: Normal rate and regular rhythm.   Pulmonary/Chest: Effort normal and breath sounds normal. No respiratory distress. He has no wheezes. He has no rales.  Abdominal: There is no rebound and no guarding.  Soft, tender to palpation in the RLQ, RUQ and epigastric region. + murphy sign.  Neurological: He is alert.  Vitals reviewed.  Lab Results  Component Value Date   WBC 10.3 08/23/2014   HGB 15.0 08/23/2014   HCT 44.0 08/23/2014   PLT 171 08/23/2014   GLUCOSE 119* 12/22/2014   CHOL 118 12/22/2014   TRIG 353.0* 12/22/2014   HDL 28.20* 12/22/2014   LDLDIRECT 52.0 12/22/2014   LDLCALC NOT CALC 11/17/2014   ALT 38 12/22/2014   AST 21 12/22/2014   NA 141 12/22/2014   K 4.6 12/22/2014   CL 107 12/22/2014   CREATININE 0.93 12/22/2014   BUN 17 12/22/2014   CO2 22 12/22/2014   TSH 2.689 08/21/2014   INR 1.0 12/09/2013   HGBA1C 8.9*  11/17/2014   MICROALBUR 0.3 11/17/2014    Assessment & Plan:   Problem List Items Addressed This Visit    Right lower quadrant abdominal pain - Primary    New problem. Unclear etiology. MSK vs appendicitis vs diverticulitis. Sending for CT scan for further eval. Labs today: CBC, CMP, Lipase.       Relevant Orders   POCT Urinalysis Dipstick (Completed)   CBC   Comp Met (CMET)   Lipase   CT Abdomen Pelvis W Contrast   Upper abdominal pain    New problem. Unclear etiology - GB disease, gastritis, PUD, etc. Starting patient on PPI. Protonix sent today. PRN Vicodin for pain. Labs today: CBC, CMP, Lipase. CT scan today as well.       Relevant Medications   pantoprazole (PROTONIX) 40 MG tablet   Other Relevant Orders   CT Abdomen Pelvis W Contrast      Meds ordered  this encounter  Medications  . pantoprazole (PROTONIX) 40 MG tablet    Sig: Take 1 tablet (40 mg total) by mouth daily.    Dispense:  30 tablet    Refill:  0  . HYDROcodone-acetaminophen (NORCO/VICODIN) 5-325 MG tablet    Sig: Take 1 tablet by mouth every 6 (six) hours as needed for moderate pain.    Dispense:  30 tablet    Refill:  0    Follow-up: PRN  [place]

## 2015-01-10 NOTE — Assessment & Plan Note (Signed)
New problem. Unclear etiology - GB disease, gastritis, PUD, etc. Starting patient on PPI. Protonix sent today. PRN Vicodin for pain. Labs today: CBC, CMP, Lipase. CT scan today as well.

## 2015-02-05 ENCOUNTER — Other Ambulatory Visit: Payer: Self-pay | Admitting: Internal Medicine

## 2015-02-19 ENCOUNTER — Encounter: Payer: Managed Care, Other (non HMO) | Attending: Internal Medicine | Admitting: Dietician

## 2015-02-19 ENCOUNTER — Encounter: Payer: Self-pay | Admitting: Dietician

## 2015-02-19 VITALS — Ht 67.0 in | Wt 246.2 lb

## 2015-02-19 DIAGNOSIS — E119 Type 2 diabetes mellitus without complications: Secondary | ICD-10-CM | POA: Diagnosis present

## 2015-02-19 NOTE — Progress Notes (Signed)

## 2015-02-21 ENCOUNTER — Telehealth: Payer: Self-pay | Admitting: *Deleted

## 2015-02-21 ENCOUNTER — Telehealth: Payer: Self-pay | Admitting: Internal Medicine

## 2015-02-21 ENCOUNTER — Other Ambulatory Visit (INDEPENDENT_AMBULATORY_CARE_PROVIDER_SITE_OTHER): Payer: Managed Care, Other (non HMO)

## 2015-02-21 DIAGNOSIS — E119 Type 2 diabetes mellitus without complications: Secondary | ICD-10-CM

## 2015-02-21 LAB — LDL CHOLESTEROL, DIRECT: Direct LDL: 68 mg/dL

## 2015-02-21 LAB — COMPREHENSIVE METABOLIC PANEL
ALT: 51 U/L (ref 0–53)
AST: 22 U/L (ref 0–37)
Albumin: 4.5 g/dL (ref 3.5–5.2)
Alkaline Phosphatase: 70 U/L (ref 39–117)
BUN: 20 mg/dL (ref 6–23)
CO2: 28 mEq/L (ref 19–32)
Calcium: 9.6 mg/dL (ref 8.4–10.5)
Chloride: 103 mEq/L (ref 96–112)
Creatinine, Ser: 0.98 mg/dL (ref 0.40–1.50)
GFR: 89.89 mL/min (ref >60.00)
Glucose, Bld: 213 mg/dL — ABNORMAL HIGH (ref 70–99)
Potassium: 4.5 mEq/L (ref 3.5–5.1)
Sodium: 138 mEq/L (ref 135–145)
Total Bilirubin: 0.6 mg/dL (ref 0.2–1.2)
Total Protein: 7.3 g/dL (ref 6.0–8.3)

## 2015-02-21 LAB — LIPID PANEL
Cholesterol: 133 mg/dL (ref 0–200)
HDL: 29.6 mg/dL — ABNORMAL LOW (ref >39.00)
NonHDL: 103.04
Total CHOL/HDL Ratio: 4
Triglycerides: 360 mg/dL — ABNORMAL HIGH (ref 0.0–149.0)
VLDL: 72 mg/dL — ABNORMAL HIGH (ref 0.0–40.0)

## 2015-02-21 LAB — HEMOGLOBIN A1C: Hgb A1c MFr Bld: 8.3 % — ABNORMAL HIGH (ref 4.6–6.5)

## 2015-02-21 NOTE — Telephone Encounter (Signed)
CMP, A1c, lipids for diabetes mellitus

## 2015-02-21 NOTE — Telephone Encounter (Signed)
Labs and dX?  

## 2015-02-22 ENCOUNTER — Encounter: Payer: Self-pay | Admitting: Internal Medicine

## 2015-02-22 ENCOUNTER — Ambulatory Visit (INDEPENDENT_AMBULATORY_CARE_PROVIDER_SITE_OTHER): Payer: Managed Care, Other (non HMO) | Admitting: Internal Medicine

## 2015-02-22 VITALS — BP 134/81 | HR 79 | Temp 98.0°F | Ht 68.0 in | Wt 242.1 lb

## 2015-02-22 DIAGNOSIS — E785 Hyperlipidemia, unspecified: Secondary | ICD-10-CM | POA: Diagnosis not present

## 2015-02-22 DIAGNOSIS — E119 Type 2 diabetes mellitus without complications: Secondary | ICD-10-CM

## 2015-02-22 DIAGNOSIS — R1011 Right upper quadrant pain: Secondary | ICD-10-CM

## 2015-02-22 DIAGNOSIS — J320 Chronic maxillary sinusitis: Secondary | ICD-10-CM | POA: Insufficient documentation

## 2015-02-22 MED ORDER — GLUCOSE BLOOD VI STRP: ORAL_STRIP | Status: DC

## 2015-02-22 NOTE — Assessment & Plan Note (Signed)
BG improved but still above goal. Discussed adding Januvia, however will hold off until issue with abdominal pain resolved. Continue Glipizide and Metformin. Continue Nutrition [place].

## 2015-02-22 NOTE — Assessment & Plan Note (Signed)
History of maxillary sinus injury and surgical repair. Now with chronic sinus congestion. Will set up ENT evaluation.

## 2015-02-22 NOTE — Assessment & Plan Note (Signed)
Encouraged consistency with Simvastatin.

## 2015-02-22 NOTE — Assessment & Plan Note (Signed)
Right upper abdominal pain. Exam remarkable for RUQ tenderness. CT abdomen was normal except for fatty infiltration of the liver. Suspect gallbladder dysfunction. Will set up HIDA scan.

## 2015-02-22 NOTE — Patient Instructions (Addendum)
We will set up a HIDA scan.  After the scan complete, we may consider starting Januvia to better control sugars.  We will set up evaluation with Dr. Elenore Rota in ENT.  Follow up in 4 weeks.

## 2015-02-22 NOTE — Progress Notes (Signed)
Pre visit review using our clinic review tool, if applicable. No additional management support is needed unless otherwise documented below in the visit note. 
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
## 2015-02-27 ENCOUNTER — Telehealth: Payer: Self-pay | Admitting: Dietician

## 2015-02-27 NOTE — Telephone Encounter (Signed)
Spoke with patient to tell him to come to class 3 next week as scheduled, 03/05/15, and to reschedule yesterday's missed class at that time.

## 2015-03-05 ENCOUNTER — Encounter: Payer: Managed Care, Other (non HMO) | Attending: Internal Medicine | Admitting: Dietician

## 2015-03-05 VITALS — BP 110/70 | Ht 67.0 in | Wt 246.4 lb

## 2015-03-05 DIAGNOSIS — E119 Type 2 diabetes mellitus without complications: Secondary | ICD-10-CM | POA: Insufficient documentation

## 2015-03-05 NOTE — Progress Notes (Signed)

## 2015-03-09 ENCOUNTER — Ambulatory Visit
Admission: RE | Admit: 2015-03-09 | Discharge: 2015-03-09 | Disposition: A | Discharge status: Home / Self Care | Payer: Managed Care, Other (non HMO) | Source: Ambulatory Visit | Attending: Internal Medicine | Admitting: Internal Medicine

## 2015-03-09 DIAGNOSIS — R1011 Right upper quadrant pain: Secondary | ICD-10-CM | POA: Insufficient documentation

## 2015-03-09 MED ORDER — TECHNETIUM TC 99M MEBROFENIN IV KIT
5.1200 | PACK | Freq: Once | INTRAVENOUS | Status: AC | PRN
  Administered 2015-03-09: 5.12 via INTRAVENOUS

## 2015-03-09 MED ORDER — SINCALIDE 5 MCG IJ SOLR
0.0200 ug/kg | Freq: Once | INTRAMUSCULAR | Status: AC
  Administered 2015-03-09: 2.2 ug via INTRAVENOUS

## 2015-03-19 ENCOUNTER — Encounter: Payer: Self-pay | Admitting: Dietician

## 2015-03-19 NOTE — Progress Notes (Signed)
Pt did not come to class 2 tonight-called pt-pt was unaware that he had a class tonight-rescheduled class 2 on 04-09-15

## 2015-03-28 ENCOUNTER — Ambulatory Visit (INDEPENDENT_AMBULATORY_CARE_PROVIDER_SITE_OTHER): Payer: Managed Care, Other (non HMO) | Admitting: Internal Medicine

## 2015-03-28 ENCOUNTER — Encounter: Payer: Self-pay | Admitting: Internal Medicine

## 2015-03-28 VITALS — BP 123/67 | HR 84 | Temp 98.0°F | Wt 245.0 lb

## 2015-03-28 DIAGNOSIS — R1011 Right upper quadrant pain: Secondary | ICD-10-CM

## 2015-03-28 DIAGNOSIS — E119 Type 2 diabetes mellitus without complications: Secondary | ICD-10-CM

## 2015-03-28 MED ORDER — SITAGLIPTIN PHOSPHATE 25 MG PO TABS: 25.0000 mg | ORAL_TABLET | Freq: Every day | ORAL | Status: DC

## 2015-03-28 NOTE — Patient Instructions (Signed)
Add Januvia 25mg  daily.  Continue Glipizide and Metformin.  Call or email if blood sugar less than 70.

## 2015-03-28 NOTE — Progress Notes (Signed)
Pre visit review using our clinic review tool, if applicable. No additional management support is needed unless otherwise documented below in the visit note. 

## 2015-03-28 NOTE — Assessment & Plan Note (Signed)
BG continue to be elevated. Will add Januvia 25mg  daily. Discussed potential side effects of this medication. Continue Glipizide and Metformin.

## 2015-03-28 NOTE — Assessment & Plan Note (Signed)
Intermittent right sided abdominal pain. CT abdomen and HIDA scan normal. Discussed that this may be abdominal cramping. Discussed adding Levsin. Also discussed potential GI evaluation for endoscopy. He prefers to hold off for now.
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
## 2015-04-09 ENCOUNTER — Ambulatory Visit: Payer: Managed Care, Other (non HMO)

## 2015-04-09 ENCOUNTER — Encounter: Payer: Self-pay | Admitting: Dietician

## 2015-04-09 NOTE — Progress Notes (Signed)
Pt did not come to class 2 tonight.

## 2015-04-30 ENCOUNTER — Encounter: Payer: Self-pay | Admitting: *Deleted

## 2015-05-15 ENCOUNTER — Other Ambulatory Visit: Payer: Self-pay | Admitting: Internal Medicine

## 2015-05-24 ENCOUNTER — Emergency Department
Admission: EM | Admit: 2015-05-24 | Discharge: 2015-05-24 | Disposition: A | Discharge status: Home / Self Care | Payer: Managed Care, Other (non HMO) | Attending: Emergency Medicine | Admitting: Emergency Medicine

## 2015-05-24 ENCOUNTER — Emergency Department: Payer: Managed Care, Other (non HMO)

## 2015-05-24 ENCOUNTER — Encounter: Payer: Self-pay | Admitting: Intensive Care

## 2015-05-24 DIAGNOSIS — E785 Hyperlipidemia, unspecified: Secondary | ICD-10-CM | POA: Diagnosis not present

## 2015-05-24 DIAGNOSIS — E669 Obesity, unspecified: Secondary | ICD-10-CM | POA: Insufficient documentation

## 2015-05-24 DIAGNOSIS — N179 Acute kidney failure, unspecified: Secondary | ICD-10-CM | POA: Insufficient documentation

## 2015-05-24 DIAGNOSIS — E119 Type 2 diabetes mellitus without complications: Secondary | ICD-10-CM | POA: Diagnosis not present

## 2015-05-24 DIAGNOSIS — Z79899 Other long term (current) drug therapy: Secondary | ICD-10-CM | POA: Diagnosis not present

## 2015-05-24 DIAGNOSIS — R202 Paresthesia of skin: Secondary | ICD-10-CM | POA: Diagnosis not present

## 2015-05-24 DIAGNOSIS — R51 Headache: Secondary | ICD-10-CM | POA: Diagnosis not present

## 2015-05-24 DIAGNOSIS — R208 Other disturbances of skin sensation: Secondary | ICD-10-CM | POA: Diagnosis not present

## 2015-05-24 DIAGNOSIS — J45909 Unspecified asthma, uncomplicated: Secondary | ICD-10-CM | POA: Insufficient documentation

## 2015-05-24 DIAGNOSIS — G4482 Headache associated with sexual activity: Secondary | ICD-10-CM

## 2015-05-24 DIAGNOSIS — I639 Cerebral infarction, unspecified: Secondary | ICD-10-CM | POA: Diagnosis present

## 2015-05-24 DIAGNOSIS — R519 Headache, unspecified: Secondary | ICD-10-CM

## 2015-05-24 DIAGNOSIS — Z7984 Long term (current) use of oral hypoglycemic drugs: Secondary | ICD-10-CM | POA: Insufficient documentation

## 2015-05-24 DIAGNOSIS — F17210 Nicotine dependence, cigarettes, uncomplicated: Secondary | ICD-10-CM | POA: Diagnosis not present

## 2015-05-24 LAB — COMPREHENSIVE METABOLIC PANEL
ALT: 40 U/L (ref 17–63)
AST: 25 U/L (ref 15–41)
Albumin: 4.2 g/dL (ref 3.5–5.0)
Alkaline Phosphatase: 55 U/L (ref 38–126)
Anion gap: 10 (ref 5–15)
BUN: 17 mg/dL (ref 6–20)
CO2: 21 mmol/L — ABNORMAL LOW (ref 22–32)
Calcium: 9.3 mg/dL (ref 8.9–10.3)
Chloride: 108 mmol/L (ref 101–111)
Creatinine, Ser: 0.93 mg/dL (ref 0.61–1.24)
GFR calc Af Amer: >60 mL/min (ref >60)
GFR calc non Af Amer: >60 mL/min (ref >60)
Glucose, Bld: 89 mg/dL (ref 65–99)
Potassium: 4.1 mmol/L (ref 3.5–5.1)
Sodium: 139 mmol/L (ref 135–145)
Total Bilirubin: 0.7 mg/dL (ref 0.3–1.2)
Total Protein: 7.7 g/dL (ref 6.5–8.1)

## 2015-05-24 LAB — CBC
HCT: 47.7 % (ref 40.0–52.0)
Hemoglobin: 16.3 g/dL (ref 13.0–18.0)
MCH: 30.7 pg (ref 26.0–34.0)
MCHC: 34.2 g/dL (ref 32.0–36.0)
MCV: 89.8 fL (ref 80.0–100.0)
Platelets: 204 10*3/uL (ref 150–440)
RBC: 5.31 MIL/uL (ref 4.40–5.90)
RDW: 13.6 % (ref 11.5–14.5)
WBC: 11.5 10*3/uL — ABNORMAL HIGH (ref 3.8–10.6)

## 2015-05-24 LAB — DIFFERENTIAL
Basophils Absolute: 0.1 10*3/uL (ref 0–0.1)
Basophils Relative: 1 %
Eosinophils Absolute: 0.1 10*3/uL (ref 0–0.7)
Eosinophils Relative: 1 %
Lymphocytes Relative: 23 %
Lymphs Abs: 2.6 10*3/uL (ref 1.0–3.6)
Monocytes Absolute: 1.4 10*3/uL — ABNORMAL HIGH (ref 0.2–1.0)
Monocytes Relative: 12 %
Neutro Abs: 7.3 10*3/uL — ABNORMAL HIGH (ref 1.4–6.5)
Neutrophils Relative %: 63 %

## 2015-05-24 LAB — TROPONIN I: Troponin I: <0.03 ng/mL (ref <0.031)

## 2015-05-24 LAB — PROTIME-INR
INR: 0.93
Prothrombin Time: 12.7 seconds (ref 11.4–15.0)

## 2015-05-24 LAB — GLUCOSE, CAPILLARY: Glucose-Capillary: 87 mg/dL (ref 65–99)

## 2015-05-24 LAB — APTT: aPTT: 27 seconds (ref 24–36)

## 2015-05-24 MED ORDER — KETOROLAC TROMETHAMINE 30 MG/ML IJ SOLN
30.0000 mg | Freq: Once | INTRAMUSCULAR | Status: AC
  Administered 2015-05-24: 30 mg via INTRAVENOUS

## 2015-05-24 MED ORDER — KETOROLAC TROMETHAMINE 30 MG/ML IJ SOLN
INTRAMUSCULAR | Status: AC
  Administered 2015-05-24: 30 mg via INTRAVENOUS
  Filled 2015-05-24: qty 1

## 2015-05-24 MED ORDER — IOPAMIDOL (ISOVUE-370) INJECTION 76%
100.0000 mL | Freq: Once | INTRAVENOUS | Status: AC | PRN
  Administered 2015-05-24: 100 mL via INTRAVENOUS

## 2015-05-24 MED ORDER — HYDROMORPHONE HCL 1 MG/ML IJ SOLN
0.5000 mg | Freq: Once | INTRAMUSCULAR | Status: AC
  Administered 2015-05-24: 0.5 mg via INTRAVENOUS
  Filled 2015-05-24: qty 1

## 2015-05-24 MED ORDER — BUTALBITAL-APAP-CAFFEINE 50-325-40 MG PO TABS: 1.0000 | ORAL_TABLET | Freq: Four times a day (QID) | ORAL | Status: DC | PRN

## 2015-05-24 MED ORDER — SODIUM CHLORIDE 0.9 % IV SOLN
Freq: Once | INTRAVENOUS | Status: AC
  Administered 2015-05-24: 14:00:00 via INTRAVENOUS

## 2015-05-24 MED ORDER — METOCLOPRAMIDE HCL 5 MG/ML IJ SOLN
10.0000 mg | Freq: Once | INTRAMUSCULAR | Status: AC
  Administered 2015-05-24: 10 mg via INTRAVENOUS
  Filled 2015-05-24: qty 2

## 2015-05-24 NOTE — ED Notes (Signed)
Patient transported to CT 
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
## 2015-05-24 NOTE — ED Notes (Signed)
Patient states on Tuesday he had sex with his wife and immediately afterward he had a severe headache.  Patient states today around 11:30 he began to have intermittent left sided facial numbness and tingling. Patient denies any other weakness or difficulty speaking.  Patient reports feeling like the left side of his head was "hit with a baseball bat."  Patient is in no obvious distress at this time.
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
## 2015-05-24 NOTE — ED Notes (Signed)
Patient back from CT.

## 2015-05-24 NOTE — ED Provider Notes (Signed)
-----------------------------------------   5:07 PM on 05/24/2015 -----------------------------------------  Patient's workup including CT angiography are within normal limits. Patient states his headache is pretty much gone at this point just very slight headache remains. Denies any other symptoms at this time. Patient has been seen by neurology. Patient denies any facial numbness or tingling. Denies any neurologic deficits at this time. At the patient's symptoms have resolved with the headache patient will be discharged home with neurology follow-up.   Minna AntisKevin Charissa Knowles, MD 05/24/15 [phone]

## 2015-05-24 NOTE — Discharge Instructions (Signed)
You have been seen in the emergency department today for a headache with facial numbness/tingling. Your workup has not shown any acute abnormalities. A CT angiography is normal. Please follow-up with neurology for any further headaches, return to the emergency department for any further neurologic symptoms such as weakness, numbness, tingling, slurred speech or confusion , Or any worsening headache.  Paresthesia Paresthesia is a burning or prickling feeling. This feeling can happen in any part of the body. It often happens in the hands, arms, legs, or feet. Usually, it is not painful. In most cases, the feeling goes away in a short time and is not a sign of a serious problem. HOME CARE  Avoid drinking alcohol.  Try massage or needle therapy (acupuncture) to help with your problems.  Keep all follow-up visits as told by your doctor. This is important. GET HELP IF:  You keep on having episodes of paresthesia.  Your burning or prickling feeling gets worse when you walk.  You have pain or cramps.  You feel dizzy.  You have a rash. GET HELP RIGHT AWAY IF:  You feel weak.  You have trouble walking or moving.  You have problems speaking, understanding, or seeing.  You feel confused.  You cannot control when you pee (urinate) or poop (bowel movement).  You lose feeling (numbness) after an injury.  You pass out (faint).   This information is not intended to replace advice given to you by your health care provider. Make sure you discuss any questions you have with your health care provider.   Document Released: 12/20/2007 Document Revised: 05/23/2014 Document Reviewed: 01/02/2014 Elsevier Interactive Patient Education 2016 [employer]  General Headache Without Cause A headache is pain or discomfort felt around the head or neck area. There are many causes and types of headaches. In some cases, the cause may not be found.  HOME CARE  Managing Pain  Take over-the-counter and  prescription medicines only as told by your doctor.  Lie down in a dark, quiet room when you have a headache.  If directed, apply ice to the head and neck area:  Put ice in a plastic bag.  Place a towel between your skin and the bag.  Leave the ice on for 20 minutes, 2-3 times per day.  Use a heating pad or hot shower to apply heat to the head and neck area as told by your doctor.  Keep lights dim if bright lights bother you or make your headaches worse. Eating and Drinking  Eat meals on a regular schedule.  Lessen how much alcohol you drink.  Lessen how much caffeine you drink, or stop drinking caffeine. General Instructions  Keep all follow-up visits as told by your doctor. This is important.  Keep a journal to find out if certain things bring on headaches. For example, write down:  What you eat and drink.  How much sleep you get.  Any change to your diet or medicines.  Relax by getting a massage or doing other relaxing activities.  Lessen stress.  Sit up straight. Do not tighten (tense) your muscles.  Do not use tobacco products. This includes cigarettes, chewing tobacco, or e-cigarettes. If you need help quitting, ask your doctor.  Exercise regularly as told by your doctor.  Get enough sleep. This often means 7-9 hours of sleep. GET HELP IF:  Your symptoms are not helped by medicine.  You have a headache that feels different than the other headaches.  You feel sick to your  stomach (nauseous) or you throw up (vomit).  You have a fever. GET HELP RIGHT AWAY IF:   Your headache becomes really bad.  You keep throwing up.  You have a stiff neck.  You have trouble seeing.  You have trouble speaking.  You have pain in the eye or ear.  Your muscles are weak or you lose muscle control.  You lose your balance or have trouble walking.  You feel like you will pass out (faint) or you pass out.  You have confusion.   This information is not intended to  replace advice given to you by your health care provider. Make sure you discuss any questions you have with your health care provider.   Document Released: 10/16/2007 Document Revised: 09/27/2014 Document Reviewed: 05/01/2014 Elsevier Interactive Patient Education [employer]

## 2015-05-24 NOTE — ED Notes (Signed)
Patient presents to the ED with left sided facial  Numbness that began around 11:30am and headache that began 1 hour ago.

## 2015-05-31 ENCOUNTER — Encounter: Payer: Self-pay | Admitting: Internal Medicine

## 2015-05-31 ENCOUNTER — Telehealth: Payer: Self-pay | Admitting: Internal Medicine

## 2015-05-31 ENCOUNTER — Ambulatory Visit (INDEPENDENT_AMBULATORY_CARE_PROVIDER_SITE_OTHER): Payer: Managed Care, Other (non HMO) | Admitting: Internal Medicine

## 2015-05-31 VITALS — BP 136/72 | HR 76 | Temp 98.8°F | Ht 67.0 in | Wt 247.8 lb

## 2015-05-31 DIAGNOSIS — R5382 Chronic fatigue, unspecified: Secondary | ICD-10-CM | POA: Diagnosis not present

## 2015-05-31 DIAGNOSIS — R51 Headache: Secondary | ICD-10-CM

## 2015-05-31 DIAGNOSIS — R519 Headache, unspecified: Secondary | ICD-10-CM

## 2015-05-31 DIAGNOSIS — E119 Type 2 diabetes mellitus without complications: Secondary | ICD-10-CM | POA: Diagnosis not present

## 2015-05-31 LAB — CBC
HCT: 47.7 % (ref 39.0–52.0)
Hemoglobin: 16.2 g/dL (ref 13.0–17.0)
MCHC: 33.9 g/dL (ref 30.0–36.0)
MCV: 89.8 fl (ref 78.0–100.0)
Platelets: 226 10*3/uL (ref 150.0–400.0)
RBC: 5.31 Mil/uL (ref 4.22–5.81)
RDW: 13.9 % (ref 11.5–15.5)
WBC: 11.6 10*3/uL — ABNORMAL HIGH (ref 4.0–10.5)

## 2015-05-31 LAB — COMPREHENSIVE METABOLIC PANEL
ALT: 31 U/L (ref 0–53)
AST: 14 U/L (ref 0–37)
Albumin: 4.4 g/dL (ref 3.5–5.2)
Alkaline Phosphatase: 65 U/L (ref 39–117)
BUN: 14 mg/dL (ref 6–23)
CO2: 25 mEq/L (ref 19–32)
Calcium: 9.2 mg/dL (ref 8.4–10.5)
Chloride: 104 mEq/L (ref 96–112)
Creatinine, Ser: 0.91 mg/dL (ref 0.40–1.50)
GFR: 97.78 mL/min (ref >60.00)
Glucose, Bld: 211 mg/dL — ABNORMAL HIGH (ref 70–99)
Potassium: 4.7 mEq/L (ref 3.5–5.1)
Sodium: 139 mEq/L (ref 135–145)
Total Bilirubin: 0.3 mg/dL (ref 0.2–1.2)
Total Protein: 6.9 g/dL (ref 6.0–8.3)

## 2015-05-31 LAB — VITAMIN B12: Vitamin B-12: 334 pg/mL (ref 211–911)

## 2015-05-31 LAB — LIPID PANEL
Cholesterol: 148 mg/dL (ref 0–200)
HDL: 26.3 mg/dL — ABNORMAL LOW (ref >39.00)
Total CHOL/HDL Ratio: 6
Triglycerides: 502 mg/dL — ABNORMAL HIGH (ref 0.0–149.0)

## 2015-05-31 LAB — LDL CHOLESTEROL, DIRECT: Direct LDL: 62 mg/dL

## 2015-05-31 LAB — TESTOSTERONE: Testosterone: 360.74 ng/dL (ref 300.00–890.00)

## 2015-05-31 LAB — TSH: TSH: 2.4 u[IU]/mL (ref 0.35–4.50)

## 2015-05-31 LAB — HEMOGLOBIN A1C: Hgb A1c MFr Bld: 9 % — ABNORMAL HIGH (ref 4.6–6.5)

## 2015-05-31 MED ORDER — SITAGLIPTIN PHOSPHATE 100 MG PO TABS: 100.0000 mg | ORAL_TABLET | Freq: Every day | ORAL | Status: DC

## 2015-05-31 MED ORDER — ALBUTEROL SULFATE HFA 108 (90 BASE) MCG/ACT IN AERS: 2.0000 | INHALATION_SPRAY | Freq: Four times a day (QID) | RESPIRATORY_TRACT | Status: DC | PRN

## 2015-05-31 NOTE — Assessment & Plan Note (Signed)
Will check A1c with labs today. Continue Januvia, Metformin and Glipizide.

## 2015-05-31 NOTE — Progress Notes (Signed)
Pre visit review using our clinic review tool, if applicable. No additional management support is needed unless otherwise documented below in the visit note. 

## 2015-05-31 NOTE — Assessment & Plan Note (Signed)
Generalized fatigue. No focal symptoms or findings on exam. Will check labs including CBC, CMP, B12. He requests testosterone level, and will check this as well, however we discussed risks of testosterone supplementation and questionable role in fatigue.

## 2015-05-31 NOTE — Assessment & Plan Note (Signed)
Reviewed recent ER notes and CT angiogram, which was normal. No recurrent headaches. Will monitor for now. We discussed possible neurology evaluation if any recurrent headaches.

## 2015-05-31 NOTE — Telephone Encounter (Signed)
Follow up.
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
## 2015-05-31 NOTE — Patient Instructions (Signed)
Labs today.  Follow up 3 months. 

## 2015-06-04 ENCOUNTER — Telehealth: Payer: Self-pay | Admitting: *Deleted

## 2015-06-04 NOTE — Telephone Encounter (Signed)
Done

## 2015-06-04 NOTE — Telephone Encounter (Signed)
Olegario MessierKathy, what does this mean?

## 2015-06-04 NOTE — Telephone Encounter (Signed)
Calvin Butler has requested a verbal order for a total testosterone   Contact [phone] Option 2 Ref # [id number]

## 2015-06-05 LAB — TESTOSTERONE, FREE AND TOTAL (INCLUDES SHBG)-(MALES)
Sex Hormone Binding: 21 nmol/L (ref 10–50)
Testosterone, Free: 107.3 pg/mL (ref 47.0–244.0)
Testosterone-% Free: 2.6 % (ref 1.6–2.9)
Testosterone: 416 ng/dL (ref 250–827)

## 2015-08-23 ENCOUNTER — Other Ambulatory Visit: Payer: Self-pay | Admitting: Internal Medicine

## 2015-08-24 NOTE — Telephone Encounter (Signed)
This is a historical medication. Please advise? Patient last seen 05/2015.

## 2015-08-31 ENCOUNTER — Ambulatory Visit: Payer: Managed Care, Other (non HMO) | Admitting: Internal Medicine

## 2015-09-03 ENCOUNTER — Ambulatory Visit (INDEPENDENT_AMBULATORY_CARE_PROVIDER_SITE_OTHER): Payer: Managed Care, Other (non HMO) | Admitting: Family

## 2015-09-03 ENCOUNTER — Encounter: Payer: Self-pay | Admitting: Family

## 2015-09-03 VITALS — BP 122/68 | HR 86 | Temp 98.1°F | Ht 68.0 in | Wt 249.2 lb

## 2015-09-03 DIAGNOSIS — E119 Type 2 diabetes mellitus without complications: Secondary | ICD-10-CM | POA: Diagnosis not present

## 2015-09-03 DIAGNOSIS — M545 Low back pain, unspecified: Secondary | ICD-10-CM

## 2015-09-03 DIAGNOSIS — E785 Hyperlipidemia, unspecified: Secondary | ICD-10-CM

## 2015-09-03 LAB — LIPID PANEL
Cholesterol: 178 mg/dL (ref 0–200)
HDL: 27.5 mg/dL — ABNORMAL LOW (ref >39.00)
Total CHOL/HDL Ratio: 6
Triglycerides: 686 mg/dL — ABNORMAL HIGH (ref 0.0–149.0)

## 2015-09-03 LAB — LDL CHOLESTEROL, DIRECT: Direct LDL: 78 mg/dL

## 2015-09-03 LAB — HEMOGLOBIN A1C: Hgb A1c MFr Bld: 9.4 % — ABNORMAL HIGH (ref 4.6–6.5)

## 2015-09-03 MED ORDER — GLIPIZIDE 10 MG PO TABS: 10.0000 mg | ORAL_TABLET | Freq: Every day | ORAL | 3 refills | Status: DC

## 2015-09-03 MED ORDER — DULOXETINE HCL 20 MG PO CPEP: ORAL_CAPSULE | ORAL | 2 refills | Status: DC

## 2015-09-03 MED ORDER — SITAGLIPTIN PHOSPHATE 100 MG PO TABS: 100.0000 mg | ORAL_TABLET | Freq: Every day | ORAL | 3 refills | Status: DC

## 2015-09-03 MED ORDER — SIMVASTATIN 40 MG PO TABS: 40.0000 mg | ORAL_TABLET | Freq: Every day | ORAL | 3 refills | Status: DC

## 2015-09-03 NOTE — Progress Notes (Signed)
Pre visit review using our clinic review tool, if applicable. No additional management support is needed unless otherwise documented below in the visit note. 

## 2015-09-03 NOTE — Assessment & Plan Note (Signed)
Uncontrolled. Patient has not been on statin. I have refilled this medication and reordered lipid panel today.

## 2015-09-03 NOTE — Patient Instructions (Signed)
Pleasure meeting you.  Keep blood sugar log.  Exercises below for low back.  If there is no improvement in your symptoms, or if there is any worsening of symptoms, or if you have any additional concerns, please return for re-evaluation; or, if we are closed, consider going to the Emergency Room for evaluation if symptoms urgent.   Low Back Sprain With Rehab A sprain is an injury in which a ligament is torn. The ligaments of the lower back are vulnerable to sprains. However, they are strong and require great force to be injured. These ligaments are important for stabilizing the spinal column. Sprains are classified into three categories. Grade 1 sprains cause pain, but the tendon is not lengthened. Grade 2 sprains include a lengthened ligament, due to the ligament being stretched or partially ruptured. With grade 2 sprains there is still function, although the function may be decreased. Grade 3 sprains involve a complete tear of the tendon or muscle, and function is usually impaired. SYMPTOMS   Severe pain in the lower back.  Sometimes, a feeling of a "pop," "snap," or tear, at the time of injury.  Tenderness and sometimes swelling at the injury site.  Uncommonly, bruising (contusion) within 48 hours of injury.  Muscle spasms in the back. CAUSES  Low back sprains occur when a force is placed on the ligaments that is greater than they can handle. Common causes of injury include:  Performing a stressful act while off-balance.  Repetitive stressful activities that involve movement of the lower back.  Direct hit (trauma) to the lower back. RISK INCREASES WITH:  Contact sports (football, wrestling).  Collisions (major skiing accidents).  Sports that require throwing or lifting (baseball, weightlifting).  Sports involving twisting of the spine (gymnastics, diving, tennis, golf).  Poor strength and flexibility.  Inadequate protection.  Previous back injury or surgery (especially  fusion). PREVENTION  Wear properly fitted and padded protective equipment.  Warm up and stretch properly before activity.  Allow for adequate recovery between workouts.  Maintain physical fitness:  Strength, flexibility, and endurance.  Cardiovascular fitness.  Maintain a healthy body weight. PROGNOSIS  If treated properly, low back sprains usually heal with non-surgical treatment. The length of time for healing depends on the severity of the injury.  RELATED COMPLICATIONS   Recurring symptoms, resulting in a chronic problem.  Chronic inflammation and pain in the low back.  Delayed healing or resolution of symptoms, especially if activity is resumed too soon.  Prolonged impairment.  Unstable or arthritic joints of the low back. TREATMENT  Treatment first involves the use of ice and medicine, to reduce pain and inflammation. The use of strengthening and stretching exercises may help reduce pain with activity. These exercises may be performed at home or with a therapist. Severe injuries may require referral to a therapist for further evaluation and treatment, such as ultrasound. Your caregiver may advise that you wear a back brace or corset, to help reduce pain and discomfort. Often, prolonged bed rest results in greater harm then benefit. Corticosteroid injections may be recommended. However, these should be reserved for the most serious cases. It is important to avoid using your back when lifting objects. At night, sleep on your back on a firm mattress, with a pillow placed under your knees. If non-surgical treatment is unsuccessful, surgery may be needed.  MEDICATION   If pain medicine is needed, nonsteroidal anti-inflammatory medicines (aspirin and ibuprofen), or other minor pain relievers (acetaminophen), are often advised.  Do not take pain  medicine for 7 days before surgery.  Prescription pain relievers may be given, if your caregiver thinks they are needed. Use only as  directed and only as much as you need.  Ointments applied to the skin may be helpful.  Corticosteroid injections may be given by your caregiver. These injections should be reserved for the most serious cases, because they may only be given a certain number of times. HEAT AND COLD  Cold treatment (icing) should be applied for 10 to 15 minutes every 2 to 3 hours for inflammation and pain, and immediately after activity that aggravates your symptoms. Use ice packs or an ice massage.  Heat treatment may be used before performing stretching and strengthening activities prescribed by your caregiver, physical therapist, or athletic trainer. Use a heat pack or a warm water soak. SEEK MEDICAL CARE IF:   Symptoms get worse or do not improve in 2 to 4 weeks, despite treatment.  You develop numbness or weakness in either leg.  You lose bowel or bladder function.  Any of the following occur after surgery: fever, increased pain, swelling, redness, drainage of fluids, or bleeding in the affected area.  New, unexplained symptoms develop. (Drugs used in treatment may produce side effects.) EXERCISES  RANGE OF MOTION (ROM) AND STRETCHING EXERCISES - Low Back Sprain Most people with lower back pain will find that their symptoms get worse with excessive bending forward (flexion) or arching at the lower back (extension). The exercises that will help resolve your symptoms will focus on the opposite motion.  Your physician, physical therapist or athletic trainer will help you determine which exercises will be most helpful to resolve your lower back pain. Do not complete any exercises without first consulting with your caregiver. Discontinue any exercises which make your symptoms worse, until you speak to your caregiver. If you have pain, numbness or tingling which travels down into your buttocks, leg or foot, the goal of the therapy is for these symptoms to move closer to your back and eventually resolve.  Sometimes, these leg symptoms will get better, but your lower back pain may worsen. This is often an indication of progress in your rehabilitation. Be very alert to any changes in your symptoms and the activities in which you participated in the 24 hours prior to the change. Sharing this information with your caregiver will allow him or her to most efficiently treat your condition. These exercises may help you when beginning to rehabilitate your injury. Your symptoms may resolve with or without further involvement from your physician, physical therapist or athletic trainer. While completing these exercises, remember:   Restoring tissue flexibility helps normal motion to return to the joints. This allows healthier, less painful movement and activity.  An effective stretch should be held for at least 30 seconds.  A stretch should never be painful. You should only feel a gentle lengthening or release in the stretched tissue. FLEXION RANGE OF MOTION AND STRETCHING EXERCISES: STRETCH - Flexion, Single Knee to Chest   Lie on a firm bed or floor with both legs extended in front of you.  Keeping one leg in contact with the floor, bring your opposite knee to your chest. Hold your leg in place by either grabbing behind your thigh or at your knee.  Pull until you feel a gentle stretch in your low back. Hold __________ seconds.  Slowly release your grasp and repeat the exercise with the opposite side. Repeat __________ times. Complete this exercise __________ times per day.  STRETCH -  Flexion, Double Knee to Chest  Lie on a firm bed or floor with both legs extended in front of you.  Keeping one leg in contact with the floor, bring your opposite knee to your chest.  Tense your stomach muscles to support your back and then lift your other knee to your chest. Hold your legs in place by either grabbing behind your thighs or at your knees.  Pull both knees toward your chest until you feel a gentle stretch  in your low back. Hold __________ seconds.  Tense your stomach muscles and slowly return one leg at a time to the floor. Repeat __________ times. Complete this exercise __________ times per day.  STRETCH - Low Trunk Rotation  Lie on a firm bed or floor. Keeping your legs in front of you, bend your knees so they are both pointed toward the ceiling and your feet are flat on the floor.  Extend your arms out to the side. This will stabilize your upper body by keeping your shoulders in contact with the floor.  Gently and slowly drop both knees together to one side until you feel a gentle stretch in your low back. Hold for __________ seconds.  Tense your stomach muscles to support your lower back as you bring your knees back to the starting position. Repeat the exercise to the other side. Repeat __________ times. Complete this exercise __________ times per day  EXTENSION RANGE OF MOTION AND FLEXIBILITY EXERCISES: STRETCH - Extension, Prone on Elbows   Lie on your stomach on the floor, a bed will be too soft. Place your palms about shoulder width apart and at the height of your head.  Place your elbows under your shoulders. If this is too painful, stack pillows under your chest.  Allow your body to relax so that your hips drop lower and make contact more completely with the floor.  Hold this position for __________ seconds.  Slowly return to lying flat on the floor. Repeat __________ times. Complete this exercise __________ times per day.  RANGE OF MOTION - Extension, Prone Press Ups  Lie on your stomach on the floor, a bed will be too soft. Place your palms about shoulder width apart and at the height of your head.  Keeping your back as relaxed as possible, slowly straighten your elbows while keeping your hips on the floor. You may adjust the placement of your hands to maximize your comfort. As you gain motion, your hands will come more underneath your shoulders.  Hold this position  __________ seconds.  Slowly return to lying flat on the floor. Repeat __________ times. Complete this exercise __________ times per day.  RANGE OF MOTION- Quadruped, Neutral Spine   Assume a hands and knees position on a firm surface. Keep your hands under your shoulders and your knees under your hips. You may place padding under your knees for comfort.  Drop your head and point your tailbone toward the ground below you. This will round out your lower back like an angry cat. Hold this position for __________ seconds.  Slowly lift your head and release your tail bone so that your back sags into a large arch, like an old horse.  Hold this position for __________ seconds.  Repeat this until you feel limber in your low back.  Now, find your "sweet spot." This will be the most comfortable position somewhere between the two previous positions. This is your neutral spine. Once you have found this position, tense your stomach muscles to  support your low back.  Hold this position for __________ seconds. Repeat __________ times. Complete this exercise __________ times per day.  STRENGTHENING EXERCISES - Low Back Sprain These exercises may help you when beginning to rehabilitate your injury. These exercises should be done near your "sweet spot." This is the neutral, low-back arch, somewhere between fully rounded and fully arched, that is your least painful position. When performed in this safe range of motion, these exercises can be used for people who have either a flexion or extension based injury. These exercises may resolve your symptoms with or without further involvement from your physician, physical therapist or athletic trainer. While completing these exercises, remember:   Muscles can gain both the endurance and the strength needed for everyday activities through controlled exercises.  Complete these exercises as instructed by your physician, physical therapist or athletic trainer. Increase  the resistance and repetitions only as guided.  You may experience muscle soreness or fatigue, but the pain or discomfort you are trying to eliminate should never worsen during these exercises. If this pain does worsen, stop and make certain you are following the directions exactly. If the pain is still present after adjustments, discontinue the exercise until you can discuss the trouble with your caregiver. STRENGTHENING - Deep Abdominals, Pelvic Tilt   Lie on a firm bed or floor. Keeping your legs in front of you, bend your knees so they are both pointed toward the ceiling and your feet are flat on the floor.  Tense your lower abdominal muscles to press your low back into the floor. This motion will rotate your pelvis so that your tail bone is scooping upwards rather than pointing at your feet or into the floor. With a gentle tension and even breathing, hold this position for __________ seconds. Repeat __________ times. Complete this exercise __________ times per day.  STRENGTHENING - Abdominals, Crunches   Lie on a firm bed or floor. Keeping your legs in front of you, bend your knees so they are both pointed toward the ceiling and your feet are flat on the floor. Cross your arms over your chest.  Slightly tip your chin down without bending your neck.  Tense your abdominals and slowly lift your trunk high enough to just clear your shoulder blades. Lifting higher can put excessive stress on the lower back and does not further strengthen your abdominal muscles.  Control your return to the starting position. Repeat __________ times. Complete this exercise __________ times per day.  STRENGTHENING - Quadruped, Opposite UE/LE Lift   Assume a hands and knees position on a firm surface. Keep your hands under your shoulders and your knees under your hips. You may place padding under your knees for comfort.  Find your neutral spine and gently tense your abdominal muscles so that you can maintain this  position. Your shoulders and hips should form a rectangle that is parallel with the floor and is not twisted.  Keeping your trunk steady, lift your right hand no higher than your shoulder and then your left leg no higher than your hip. Make sure you are not holding your breath. Hold this position for __________ seconds.  Continuing to keep your abdominal muscles tense and your back steady, slowly return to your starting position. Repeat with the opposite arm and leg. Repeat __________ times. Complete this exercise __________ times per day.  STRENGTHENING - Abdominals and Quadriceps, Straight Leg Raise   Lie on a firm bed or floor with both legs extended in front  of you.  Keeping one leg in contact with the floor, bend the other knee so that your foot can rest flat on the floor.  Find your neutral spine, and tense your abdominal muscles to maintain your spinal position throughout the exercise.  Slowly lift your straight leg off the floor about 6 inches for a count of 15, making sure to not hold your breath.  Still keeping your neutral spine, slowly lower your leg all the way to the floor. Repeat this exercise with each leg __________ times. Complete this exercise __________ times per day. POSTURE AND BODY MECHANICS CONSIDERATIONS - Low Back Sprain Keeping correct posture when sitting, standing or completing your activities will reduce the stress put on different body tissues, allowing injured tissues a chance to heal and limiting painful experiences. The following are general guidelines for improved posture. Your physician or physical therapist will provide you with any instructions specific to your needs. While reading these guidelines, remember:  The exercises prescribed by your provider will help you have the flexibility and strength to maintain correct postures.  The correct posture provides the best environment for your joints to work. All of your joints have less wear and tear when  properly supported by a spine with good posture. This means you will experience a healthier, less painful body.  Correct posture must be practiced with all of your activities, especially prolonged sitting and standing. Correct posture is as important when doing repetitive low-stress activities (typing) as it is when doing a single heavy-load activity (lifting). RESTING POSITIONS Consider which positions are most painful for you when choosing a resting position. If you have pain with flexion-based activities (sitting, bending, stooping, squatting), choose a position that allows you to rest in a less flexed posture. You would want to avoid curling into a fetal position on your side. If your pain worsens with extension-based activities (prolonged standing, working overhead), avoid resting in an extended position such as sleeping on your stomach. Most people will find more comfort when they rest with their spine in a more neutral position, neither too rounded nor too arched. Lying on a non-sagging bed on your side with a pillow between your knees, or on your back with a pillow under your knees will often provide some relief. Keep in mind, being in any one position for a prolonged period of time, no matter how correct your posture, can still lead to stiffness. PROPER SITTING POSTURE In order to minimize stress and discomfort on your spine, you must sit with correct posture. Sitting with good posture should be effortless for a healthy body. Returning to good posture is a gradual process. Many people can work toward this most comfortably by using various supports until they have the flexibility and strength to maintain this posture on their own. When sitting with proper posture, your ears will fall over your shoulders and your shoulders will fall over your hips. You should use the back of the chair to support your upper back. Your lower back will be in a neutral position, just slightly arched. You may place a small  pillow or folded towel at the base of your lower back for  support.  When working at a desk, create an environment that supports good, upright posture. Without extra support, muscles tire, which leads to excessive strain on joints and other tissues. Keep these recommendations in mind: CHAIR:  A chair should be able to slide under your desk when your back makes contact with the back of the  chair. This allows you to work closely.  The chair's height should allow your eyes to be level with the upper part of your monitor and your hands to be slightly lower than your elbows. BODY POSITION  Your feet should make contact with the floor. If this is not possible, use a foot rest.  Keep your ears over your shoulders. This will reduce stress on your neck and low back. INCORRECT SITTING POSTURES  If you are feeling tired and unable to assume a healthy sitting posture, do not slouch or slump. This puts excessive strain on your back tissues, causing more damage and pain. Healthier options include:  Using more support, like a lumbar pillow.  Switching tasks to something that requires you to be upright or walking.  Talking a brief walk.  Lying down to rest in a neutral-spine position. PROLONGED STANDING WHILE SLIGHTLY LEANING FORWARD  When completing a task that requires you to lean forward while standing in one place for a long time, place either foot up on a stationary 2-4 inch high object to help maintain the best posture. When both feet are on the ground, the lower back tends to lose its slight inward curve. If this curve flattens (or becomes too large), then the back and your other joints will experience too much stress, tire more quickly, and can cause pain. CORRECT STANDING POSTURES Proper standing posture should be assumed with all daily activities, even if they only take a few moments, like when brushing your teeth. As in sitting, your ears should fall over your shoulders and your shoulders should  fall over your hips. You should keep a slight tension in your abdominal muscles to brace your spine. Your tailbone should point down to the ground, not behind your body, resulting in an over-extended swayback posture.  INCORRECT STANDING POSTURES  Common incorrect standing postures include a forward head, locked knees and/or an excessive swayback. WALKING Walk with an upright posture. Your ears, shoulders and hips should all line-up. PROLONGED ACTIVITY IN A FLEXED POSITION When completing a task that requires you to bend forward at your waist or lean over a low surface, try to find a way to stabilize 3 out of 4 of your limbs. You can place a hand or elbow on your thigh or rest a knee on the surface you are reaching across. This will provide you more stability, so that your muscles do not tire as quickly. By keeping your knees relaxed, or slightly bent, you will also reduce stress across your lower back. CORRECT LIFTING TECHNIQUES DO :  Assume a wide stance. This will provide you more stability and the opportunity to get as close as possible to the object which you are lifting.  Tense your abdominals to brace your spine. Bend at the knees and hips. Keeping your back locked in a neutral-spine position, lift using your leg muscles. Lift with your legs, keeping your back straight.  Test the weight of unknown objects before attempting to lift them.  Try to keep your elbows locked down at your sides in order get the best strength from your shoulders when carrying an object.  Always ask for help when lifting heavy or awkward objects. INCORRECT LIFTING TECHNIQUES DO NOT:   Lock your knees when lifting, even if it is a small object.  Bend and twist. Pivot at your feet or move your feet when needing to change directions.  Assume that you can safely pick up even a paperclip without proper posture.  This information is not intended to replace advice given to you by your health care provider. Make  sure you discuss any questions you have with your health care provider.   Document Released: 01/06/2005 Document Revised: 01/27/2014 Document Reviewed: 04/20/2008 [employer] Interactive Patient Education [employer].

## 2015-09-03 NOTE — Progress Notes (Signed)
Subjective:    Patient ID: Calvin Butler, male    DOB: Jun 05, 1975, 40 y.o.   MRN: [id number]  CC: Calvin Butler is a 40 y.o. male who presents today for follow up.   HPI: Patient here for follow-up, establish care.   Diabetes: Not checking BS. Notes occasional blurry vision. Eye exam few months ago.  Notes increased urination and thirst. No numbness or tingling in feet. Taking all DM medications. Fasting BS when he takes is 150.   Hyperlipidemia: Has stopped taking Zocor; ran out prescription and never filled it.   LBP: Taking 4-5 tabs of ibuprofen for LBP. H/o GIB. No numbness or tingling. No history of cancer. No LE weakness, falls, saddle anesthesia, or urinary or bowel incontinence.  Worse in the morning. Improves with ibuprofen.       Last A1C 9% 3 months ago. TG 502.   HISTORY:  Past Medical History:  Diagnosis Date  . Asthma   . Diabetes mellitus without complication (HCC)   . History of GI bleed    secondary to NSAIDs   Past Surgical History:  Procedure Laterality Date  . FACIAL FRACTURE SURGERY  at 40 yrs old   Right side face - due to accident  . FACIAL FRACTURE SURGERY    . PERCUTANEOUS CORONARY STENT INTERVENTION (PCI-S)  2015   Family History  Problem Relation Age of Onset  . Aneurysm Mother     Brain  . Diabetes Father   . Asthma Sister   . Diabetes Maternal Grandmother   . Arthritis Daughter   . Arthritis Daughter   . Prostate cancer Neg Hx     Allergies: Review of patient's allergies indicates no known allergies. Current Outpatient Prescriptions on File Prior to Visit  Medication Sig Dispense Refill  . albuterol (PROVENTIL HFA;VENTOLIN HFA) 108 (90 Base) MCG/ACT inhaler Inhale 2 puffs into the lungs every 6 (six) hours as needed for wheezing or shortness of breath. 1 Inhaler 2  . ibuprofen (ADVIL,MOTRIN) 200 MG tablet Take 400-800 mg by mouth every 6 (six) hours as needed.    . metFORMIN (GLUCOPHAGE) 500 MG tablet TAKE TWO TABLETS BY MOUTH  TWICE DAILY WITH MEALS 360 tablet 0   No current facility-administered medications on file prior to visit.     Social History  Substance Use Topics  . Smoking status: Current Every Day Smoker    Packs/day: 1.00    Years: 27.00    Types: Cigarettes  . Smokeless tobacco: Never Used  . Alcohol use 0.0 oz/week     Comment: Occasional    Review of Systems  Constitutional: Negative for chills and fever.  Respiratory: Negative for cough.   Cardiovascular: Negative for chest pain and palpitations.  Gastrointestinal: Negative for nausea and vomiting.      Objective:    BP 122/68   Pulse 86   Temp 98.1 F (36.7 C) (Oral)   Ht 5\' 8"  (1.727 m)   Wt 249 lb 3.2 oz (113 kg)   SpO2 95%   BMI 37.89 kg/m  BP Readings from Last 3 Encounters:  09/03/15 122/68  05/31/15 136/72  05/24/15 (!) 126/92   Wt Readings from Last 3 Encounters:  09/03/15 249 lb 3.2 oz (113 kg)  05/31/15 247 lb 12.8 oz (112.4 kg)  05/24/15 250 lb (113.4 kg)    Physical Exam  Constitutional: He appears well-developed and well-nourished.  Cardiovascular: Regular rhythm and normal heart sounds.   Pulmonary/Chest: Effort normal and breath sounds normal. No  respiratory distress. He has no wheezes. He has no rales.  Musculoskeletal:       Lumbar back: He exhibits normal range of motion, no tenderness, no swelling, no pain and no spasm.  Full range of motion with flexion, extension, lateral side bends. No pain, numbness, tingling elicited with single leg raise bilaterally. No rash.  Neurological: He is alert.  Skin: Skin is warm and dry.  Psychiatric: He has a normal mood and affect. His speech is normal and behavior is normal.  Vitals reviewed.      Assessment & Plan:   Problem List Items Addressed This Visit      Endocrine   Diabetes (HCC) (Chronic)    Uncontrolled. Patient is on 3 diabetic medications. I placed a referral to endocrine for consult.      Relevant Medications   sitaGLIPtin (JANUVIA) 100  MG tablet   glipiZIDE (GLUCOTROL) 10 MG tablet   simvastatin (ZOCOR) 40 MG tablet   Other Relevant Orders   Hemoglobin A1c   Ambulatory referral to Endocrinology     Other   Low back pain    Chronic, at baseline. I have concern the patient has been taking ibuprofen with a history of GIB. I have strongly encouraged him to stop taking any NSAID and to start a trial of Cymbalta to see if this helps with pain. Close follow-up in one month.      Relevant Medications   DULoxetine (CYMBALTA) 20 MG capsule   Hyperlipidemia - Primary    Uncontrolled. Patient has not been on statin. I have refilled this medication and reordered lipid panel today.      Relevant Medications   simvastatin (ZOCOR) 40 MG tablet   Other Relevant Orders   Lipid panel    Other Visit Diagnoses   None.      I have discontinued Mr. Marden NobleMorales's butalbital-acetaminophen-caffeine. I am also having him start on DULoxetine. Additionally, I am having him maintain his ibuprofen, albuterol, metFORMIN, sitaGLIPtin, glipiZIDE, and simvastatin.   Meds ordered this encounter  Medications  . DULoxetine (CYMBALTA) 20 MG capsule    Sig: Take 20 mg by mouth once a day for one week. Then increase to 20 mg twice a day and stay at that dose thereafter.    Dispense:  60 capsule    Refill:  2    Order Specific Question:   Supervising Provider    Answer:   Duncan DullULLO, TERESA L [[id number]]  . sitaGLIPtin (JANUVIA) 100 MG tablet    Sig: Take 1 tablet (100 mg total) by mouth daily.    Dispense:  30 tablet    Refill:  3    Order Specific Question:   Supervising Provider    Answer:   Duncan DullULLO, TERESA L [[id number]]  . glipiZIDE (GLUCOTROL) 10 MG tablet    Sig: Take 1 tablet (10 mg total) by mouth daily before breakfast.    Dispense:  90 tablet    Refill:  3    Order Specific Question:   Supervising Provider    Answer:   Duncan DullULLO, TERESA L [[id number]]  . simvastatin (ZOCOR) 40 MG tablet    Sig: Take 1 tablet (40 mg total) by mouth daily.    Dispense:  90  tablet    Refill:  3    Order Specific Question:   Supervising Provider    Answer:   Sherlene ShamsULLO, TERESA L [[id number]]    Return precautions given.   Risks, benefits, and alternatives of the medications and treatment plan prescribed  today were discussed, and patient expressed understanding.   Education regarding symptom management and diagnosis given to patient on AVS.  Continue to follow with Wynona DoveWALKER,JENNIFER AZBELL, MD for routine health maintenance.   Rosanna RandyAlfredo D [place] and I agreed with plan.   Rennie PlowmanMargaret Shaquasha Gerstel, FNP

## 2015-09-03 NOTE — Assessment & Plan Note (Signed)
Uncontrolled. Patient is on 3 diabetic medications. I placed a referral to endocrine for consult.

## 2015-09-03 NOTE — Assessment & Plan Note (Signed)
Chronic, at baseline. I have concern the patient has been taking ibuprofen with a history of GIB. I have strongly encouraged him to stop taking any NSAID and to start a trial of Cymbalta to see if this helps with pain. Close follow-up in one month.

## 2015-10-08 ENCOUNTER — Encounter: Payer: Self-pay | Admitting: Family

## 2015-10-08 ENCOUNTER — Ambulatory Visit (INDEPENDENT_AMBULATORY_CARE_PROVIDER_SITE_OTHER): Payer: Managed Care, Other (non HMO) | Admitting: Family

## 2015-10-08 VITALS — BP 132/62 | HR 82 | Temp 97.9°F | Ht 68.0 in | Wt 234.8 lb

## 2015-10-08 DIAGNOSIS — F17200 Nicotine dependence, unspecified, uncomplicated: Secondary | ICD-10-CM

## 2015-10-08 DIAGNOSIS — E119 Type 2 diabetes mellitus without complications: Secondary | ICD-10-CM | POA: Diagnosis not present

## 2015-10-08 DIAGNOSIS — J320 Chronic maxillary sinusitis: Secondary | ICD-10-CM

## 2015-10-08 DIAGNOSIS — M545 Low back pain: Secondary | ICD-10-CM

## 2015-10-08 NOTE — Patient Instructions (Signed)
2 month lab recheck..   So proud of you!!!!!

## 2015-10-08 NOTE — Progress Notes (Signed)
Subjective:    Patient ID: Calvin Butler, male    DOB: 05/19/1975, 40 y.o.   MRN: [id number]  CC: Calvin Butler is a 40 y.o. male who presents today for follow up.   HPI: Patient here for follow up after starting Cymbalta one month ago for low back pain. Due to his history of GI bleed, I had advised him to stop taking inflammatories. He hasn't had to take any NSAIDs.  DM- Since pain is better, has started going back to gym. Has changed eating habits and has started drinking [employer] protein shake in the morning.  Shared BS log and averaging < 150.  Higher in afternoon/evening with 170's. Canceled endocrine appointment as feeling so much better. 2 low BS's at 77 after having peanut butter toast.    Decreased smoking from pack and a half of cigarettes per day to 1/2 pack a day.           HISTORY:  Past Medical History:  Diagnosis Date  . Asthma   . Diabetes mellitus without complication (HCC)   . History of GI bleed    secondary to NSAIDs   Past Surgical History:  Procedure Laterality Date  . FACIAL FRACTURE SURGERY  at 40 yrs old   Right side face - due to accident  . FACIAL FRACTURE SURGERY    . PERCUTANEOUS CORONARY STENT INTERVENTION (PCI-S)  2015   Family History  Problem Relation Age of Onset  . Aneurysm Mother     Brain  . Diabetes Father   . Asthma Sister   . Diabetes Maternal Grandmother   . Arthritis Daughter   . Arthritis Daughter   . Prostate cancer Neg Hx     Allergies: Review of patient's allergies indicates no known allergies. Current Outpatient Prescriptions on File Prior to Visit  Medication Sig Dispense Refill  . albuterol (PROVENTIL HFA;VENTOLIN HFA) 108 (90 Base) MCG/ACT inhaler Inhale 2 puffs into the lungs every 6 (six) hours as needed for wheezing or shortness of breath. 1 Inhaler 2  . DULoxetine (CYMBALTA) 20 MG capsule Take 20 mg by mouth once a day for one week. Then increase to 20 mg twice a day and stay at that dose thereafter. 60  capsule 2  . glipiZIDE (GLUCOTROL) 10 MG tablet Take 1 tablet (10 mg total) by mouth daily before breakfast. 90 tablet 3  . ibuprofen (ADVIL,MOTRIN) 200 MG tablet Take 400-800 mg by mouth every 6 (six) hours as needed.    . metFORMIN (GLUCOPHAGE) 500 MG tablet TAKE TWO TABLETS BY MOUTH TWICE DAILY WITH MEALS 360 tablet 0  . simvastatin (ZOCOR) 40 MG tablet Take 1 tablet (40 mg total) by mouth daily. 90 tablet 3  . sitaGLIPtin (JANUVIA) 100 MG tablet Take 1 tablet (100 mg total) by mouth daily. 30 tablet 3   No current facility-administered medications on file prior to visit.     Social History  Substance Use Topics  . Smoking status: Current Every Day Smoker    Packs/day: 1.00    Years: 27.00    Types: Cigarettes  . Smokeless tobacco: Never Used  . Alcohol use 0.0 oz/week     Comment: Occasional    Review of Systems  Constitutional: Negative for chills and fever.  HENT: Negative for congestion, ear pain, rhinorrhea, sinus pressure and sore throat.   Respiratory: Negative for cough, shortness of breath and wheezing.   Cardiovascular: Negative for chest pain and palpitations.  Gastrointestinal: Negative for diarrhea, nausea and vomiting.  Genitourinary: Negative for dysuria.  Musculoskeletal: Negative for myalgias.  Skin: Negative for rash.  Neurological: Negative for headaches.  Hematological: Negative for adenopathy.      Objective:    BP 132/62   Pulse 82   Temp 97.9 F (36.6 C) (Oral)   Ht 5\' 8"  (1.727 m)   Wt 234 lb 12.8 oz (106.5 kg)   BMI 35.70 kg/m  BP Readings from Last 3 Encounters:  10/08/15 132/62  09/03/15 122/68  05/31/15 136/72   Wt Readings from Last 3 Encounters:  10/08/15 234 lb 12.8 oz (106.5 kg)  09/03/15 249 lb 3.2 oz (113 kg)  05/31/15 247 lb 12.8 oz (112.4 kg)    Physical Exam  Constitutional: He appears well-developed and well-nourished.  Cardiovascular: Regular rhythm and normal heart sounds.   Pulmonary/Chest: Effort normal and breath  sounds normal. No respiratory distress. He has no wheezes. He has no rhonchi. He has no rales.  Neurological: He is alert.  Skin: Skin is warm and dry.  Psychiatric: He has a normal mood and affect. His speech is normal and behavior is normal.  Vitals reviewed.      Assessment & Plan:   Problem List Items Addressed This Visit      Respiratory   Chronic maxillary sinusitis    Would still like to have surgery; advised that once DM is better controlled, he could speak with ENT again.         Endocrine   Diabetes (HCC) - Primary (Chronic)    Improving. Congratulated patient on change in diet habits and checking blood sugar 3 sugars. Due to hypoglycemic episodes in the morning (2), Trial of taking glipizide at lunch as opposed to breakfast. This may also help with high blood sugars in afternoon and evening. Last A1c 1 months ago, will recheck in 2 months.       Relevant Orders   Hemoglobin A1c     Other   Tobacco use disorder    Decreased. Congratulated patient. Encouraged continued limiting of cigarettes.       Low back pain    Stable, improved on cymbalta. Congratulated patient on weight loss and exercise, both of which likely help is back pain as well.        Other Visit Diagnoses   None.      I am having Calvin Butler maintain his ibuprofen, albuterol, metFORMIN, DULoxetine, sitaGLIPtin, glipiZIDE, and simvastatin.   No orders of the defined types were placed in this encounter.   Return precautions given.   Risks, benefits, and alternatives of the medications and treatment plan prescribed today were discussed, and patient expressed understanding.   Education regarding symptom management and diagnosis given to patient on AVS.  Continue to follow with Wynona Dove, MD for routine health maintenance.   Calvin Butler and I agreed with plan.   Rennie Plowman, FNP

## 2015-10-08 NOTE — Assessment & Plan Note (Addendum)
Improving. Congratulated patient on change in diet habits and checking blood sugar 3 sugars. Due to hypoglycemic episodes in the morning (2), Trial of taking glipizide at lunch as opposed to breakfast. This may also help with high blood sugars in afternoon and evening. Last A1c 1 months ago, will recheck in 2 months.

## 2015-10-08 NOTE — Progress Notes (Signed)
Pre visit review using our clinic review tool, if applicable. No additional management support is needed unless otherwise documented below in the visit note. 

## 2015-10-08 NOTE — Assessment & Plan Note (Signed)
Decreased. Congratulated patient. Encouraged continued limiting of cigarettes.

## 2015-10-08 NOTE — Assessment & Plan Note (Signed)
Stable, improved on cymbalta. Congratulated patient on weight loss and exercise, both of which likely help is back pain as well.

## 2015-10-08 NOTE — Assessment & Plan Note (Addendum)
Would still like to have surgery; advised that once DM is better controlled, he could speak with ENT again.

## 2015-10-16 ENCOUNTER — Ambulatory Visit: Payer: Managed Care, Other (non HMO) | Admitting: Internal Medicine

## 2015-11-06 ENCOUNTER — Encounter: Payer: Self-pay | Admitting: Family Medicine

## 2015-11-06 ENCOUNTER — Ambulatory Visit (INDEPENDENT_AMBULATORY_CARE_PROVIDER_SITE_OTHER): Payer: Managed Care, Other (non HMO) | Admitting: Family Medicine

## 2015-11-06 DIAGNOSIS — H60502 Unspecified acute noninfective otitis externa, left ear: Secondary | ICD-10-CM

## 2015-11-06 MED ORDER — NEOMYCIN-POLYMYXIN-HC 3.5-10000-1 OT SOLN: 4.0000 [drp] | Freq: Four times a day (QID) | OTIC | 0 refills | Status: DC

## 2015-11-06 NOTE — Patient Instructions (Addendum)
Use the drop as prescribed.  Take 800 mg Ibuprofen for the jaw.  Follow up later this year regarding your diabetes.  Take care  Dr. Adriana Simasook

## 2015-11-06 NOTE — Assessment & Plan Note (Signed)
Acute problem (history of this in the past). Treating with Cortisporin. Regarding jaw pain, suspect TMJ. Advised PRN Ibuprofen.

## 2015-11-06 NOTE — Progress Notes (Signed)
Pre visit review using our clinic review tool, if applicable. No additional management support is needed unless otherwise documented below in the visit note. 

## 2015-11-06 NOTE — Progress Notes (Signed)
Subjective:  Patient ID: Calvin Butler, male    DOB: 06/27/1975  Age: 40 y.o. MRN: [id number]  CC: ? Ear infection, L jaw pain  HPI:  40 year old male with DM 2 resents with the above complaints.  Patient states that he's had left ear pain for the past 2-3 days. Mild to moderate. He reports associated itching. Additionally, he's had discomfort of the left side of the jaw. No known exacerbating or relieving factors. No associated fevers or chills. No other associated symptoms. No other complaints this time.  Social Hx   Social History   Social History  . Marital status: Married    Spouse name: N/A  . Number of children: 2  . Years of education: N/A   Occupational History  . Diesl Mechanic    Social History Main Topics  . Smoking status: Current Every Day Smoker    Packs/day: 1.00    Years: 27.00    Types: Cigarettes  . Smokeless tobacco: Never Used  . Alcohol use 0.0 oz/week     Comment: Occasional  . Drug use: No  . Sexual activity: Not Asked   Other Topics Concern  . None   Social History Narrative   [place] in [place] with wife, daughter, sister and 2 children.   Review of Systems  Constitutional: Negative for fever.  HENT: Positive for ear pain.   Musculoskeletal:       Left jaw pain.   Objective:  BP 126/72 (BP Location: Right Arm, Patient Position: Sitting, Cuff Size: Large)   Pulse 81   Temp 97.7 F (36.5 C) (Oral)   Wt 234 lb 12 oz (106.5 kg)   SpO2 95%   BMI 35.69 kg/m   BP/Weight 11/06/2015 10/08/2015 09/03/2015  Systolic BP 126 132 122  Diastolic BP 72 62 68  Wt. (Lbs) 234.75 234.8 249.2  BMI 35.69 35.7 37.89   Physical Exam  Constitutional: He is oriented to person, place, and time. He appears well-developed. No distress.  HENT:  Mouth/Throat: Oropharynx is clear and moist.  No evidence of active dental infection. Normal TMs bilaterally. Left ear canal with severe erythema. Tragal tenderness to palpation. Pain with range of motion  of jaw (L TMJ).  Cardiovascular: Normal rate and regular rhythm.   Pulmonary/Chest: Effort normal.  Neurological: He is alert and oriented to person, place, and time.  Psychiatric: He has a normal mood and affect.  Vitals reviewed.  = Lab Results  Component Value Date   WBC 11.6 (H) 05/31/2015   HGB 16.2 05/31/2015   HCT 47.7 05/31/2015   PLT 226.0 05/31/2015   GLUCOSE 211 (H) 05/31/2015   CHOL 178 09/03/2015   TRIG (H) 09/03/2015    686.0 Triglyceride is over 400; calculations on Lipids are invalid.   HDL 27.50 (L) 09/03/2015   LDLDIRECT 78.0 09/03/2015   LDLCALC NOT CALC 11/17/2014   ALT 31 05/31/2015   AST 14 05/31/2015   NA 139 05/31/2015   K 4.7 05/31/2015   CL 104 05/31/2015   CREATININE 0.91 05/31/2015   BUN 14 05/31/2015   CO2 25 05/31/2015   TSH 2.40 05/31/2015   INR 0.93 05/24/2015   HGBA1C 9.4 (H) 09/03/2015   MICROALBUR 0.3 11/17/2014    Assessment & Plan:   Problem List Items Addressed This Visit    Otitis externa    Acute problem (history of this in the past). Treating with Cortisporin. Regarding jaw pain, suspect TMJ. Advised PRN Ibuprofen.       Other  Visit Diagnoses   None.     Meds ordered this encounter  Medications  . neomycin-polymyxin-hydrocortisone (CORTISPORIN) otic solution    Sig: Place 4 drops into the left ear 4 (four) times daily.    Dispense:  10 mL    Refill:  0    Follow-up: PRN  Everlene Other DO [facility]

## 2015-11-07 IMAGING — CR DG CHEST 2V
1 series · 2 of 2 positions shown · non-contrast
Comparison: 01/27/2010.

CLINICAL DATA: 37-year-old male status post fall with pain. Initial
encounter.

EXAM:
CHEST  2 VIEW

[Series 1: pa · 0.17mm/px · 2 of 2 slices shown]
[im 1/2]
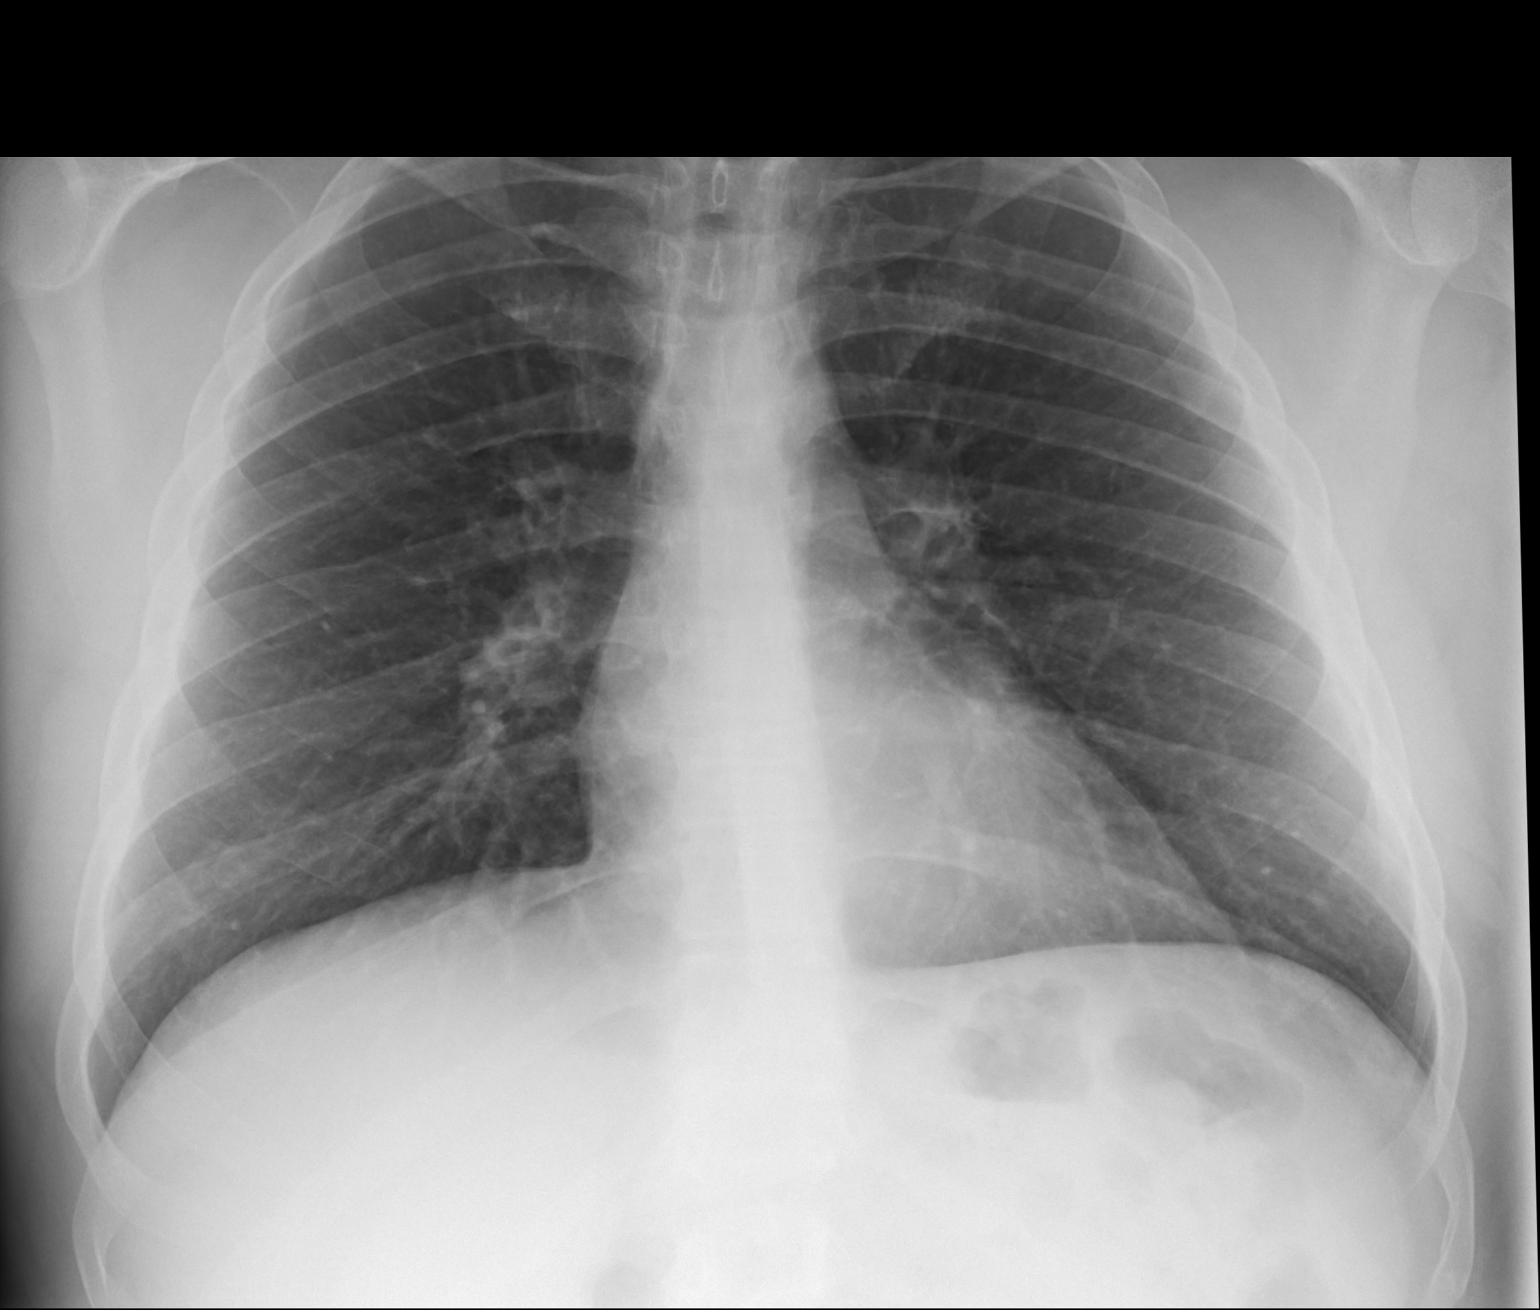
[im 2/2]
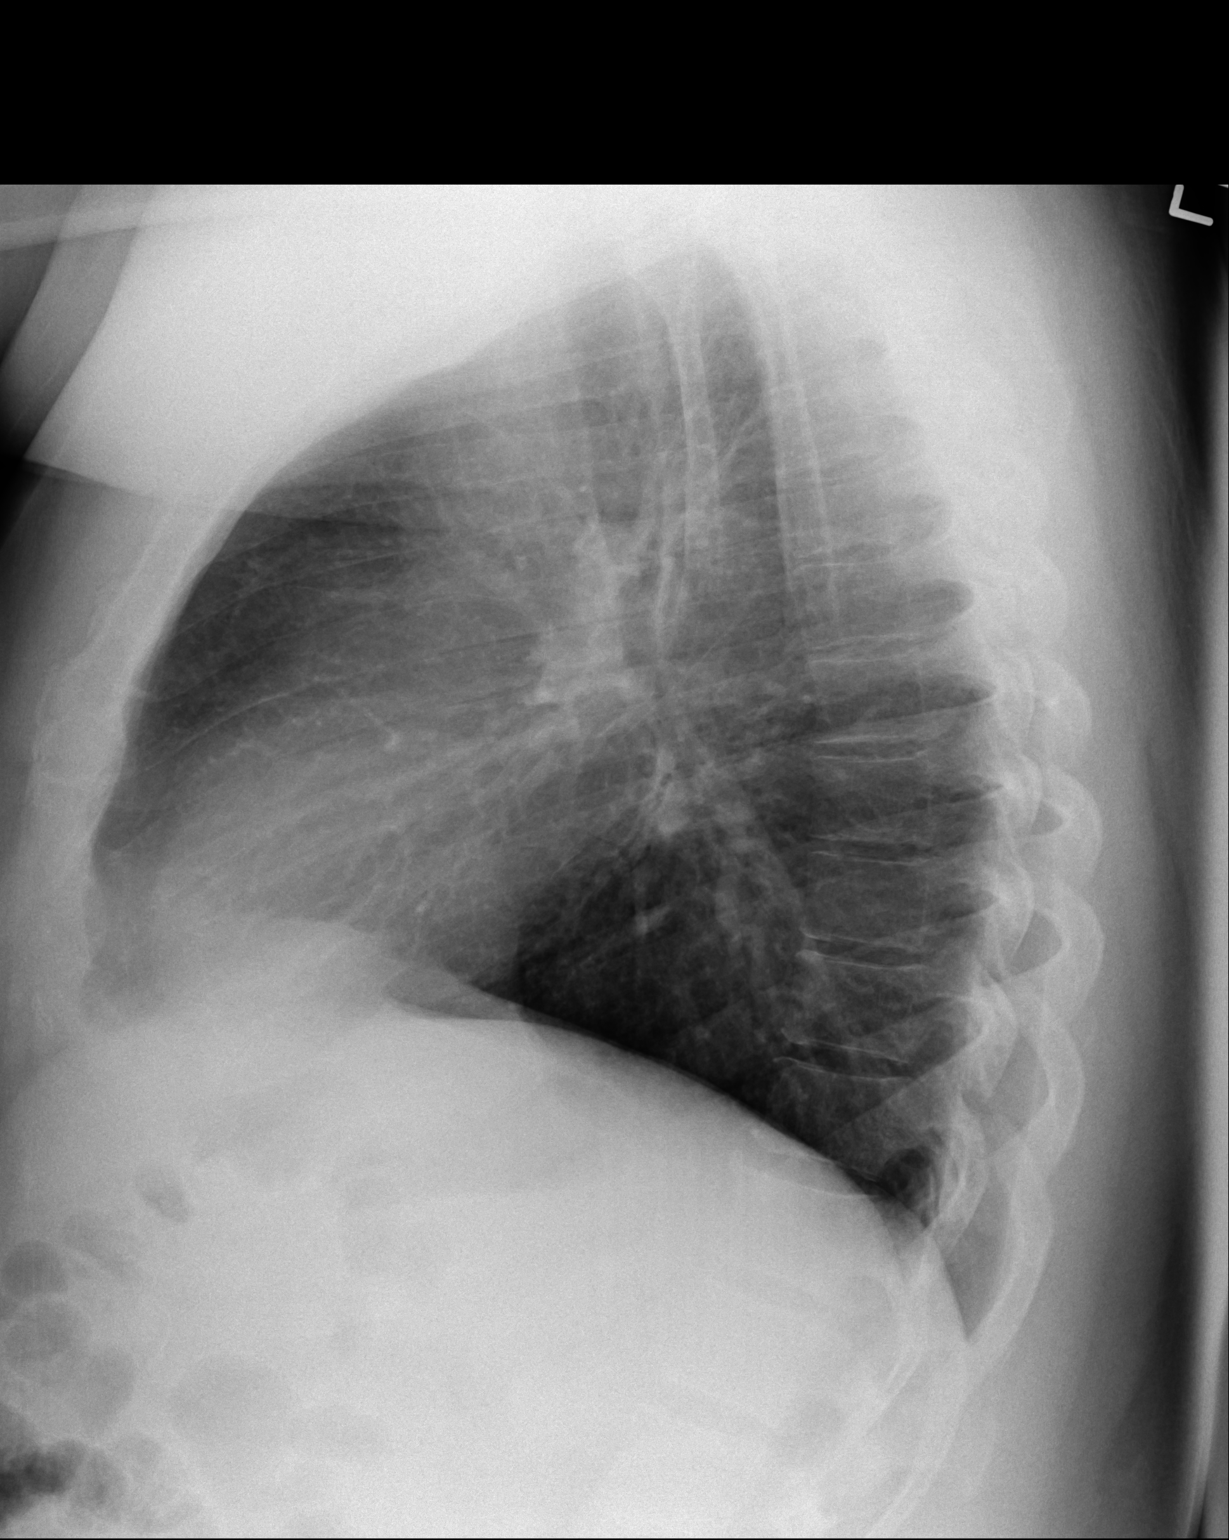

[2 of 2 positions shown; findings below may reference images not displayed]

FINDINGS: Stable common normal lung volumes. Normal cardiac size and
mediastinal contours. Visualized tracheal air column is within
normal limits. The lungs are clear. No pneumothorax or effusion. No
acute osseous abnormality identified.
IMPRESSION: Negative, no acute cardiopulmonary abnormality or acute traumatic
injury identified.

## 2015-11-23 ENCOUNTER — Other Ambulatory Visit: Payer: Self-pay

## 2015-11-23 ENCOUNTER — Telehealth: Payer: Self-pay | Admitting: Family Medicine

## 2015-11-23 MED ORDER — METFORMIN HCL 500 MG PO TABS: ORAL_TABLET | ORAL | 0 refills | Status: DC

## 2015-11-23 NOTE — Telephone Encounter (Signed)
Pt called and is requesting a refill metFORMIN (GLUCOPHAGE) 500 MG tablet. Thank you!  Pharmacy - [facility] [address] - [place] - [address]  Call pt @ [phone]

## 2015-11-23 NOTE — Telephone Encounter (Signed)
Refilled

## 2015-12-03 ENCOUNTER — Other Ambulatory Visit: Payer: Self-pay | Admitting: Family

## 2015-12-03 DIAGNOSIS — M545 Low back pain, unspecified: Secondary | ICD-10-CM

## 2015-12-05 ENCOUNTER — Other Ambulatory Visit: Payer: Self-pay

## 2015-12-05 MED ORDER — DULOXETINE HCL 20 MG PO CPEP: ORAL_CAPSULE | ORAL | 2 refills | Status: DC

## 2015-12-05 NOTE — Telephone Encounter (Signed)
Medication has been refilled.

## 2015-12-10 ENCOUNTER — Other Ambulatory Visit (INDEPENDENT_AMBULATORY_CARE_PROVIDER_SITE_OTHER): Payer: Managed Care, Other (non HMO)

## 2015-12-10 ENCOUNTER — Encounter: Payer: Self-pay | Admitting: Family

## 2015-12-10 DIAGNOSIS — E119 Type 2 diabetes mellitus without complications: Secondary | ICD-10-CM

## 2015-12-10 LAB — HEMOGLOBIN A1C: Hgb A1c MFr Bld: 7.4 % — ABNORMAL HIGH (ref 4.6–6.5)

## 2015-12-25 ENCOUNTER — Other Ambulatory Visit: Payer: Self-pay | Admitting: Family Medicine

## 2016-01-07 ENCOUNTER — Encounter: Payer: Managed Care, Other (non HMO) | Admitting: Family

## 2016-01-07 ENCOUNTER — Encounter: Payer: Self-pay | Admitting: Family

## 2016-01-07 ENCOUNTER — Ambulatory Visit (INDEPENDENT_AMBULATORY_CARE_PROVIDER_SITE_OTHER): Payer: Managed Care, Other (non HMO) | Admitting: Family

## 2016-01-07 DIAGNOSIS — E785 Hyperlipidemia, unspecified: Secondary | ICD-10-CM

## 2016-01-07 DIAGNOSIS — E119 Type 2 diabetes mellitus without complications: Secondary | ICD-10-CM

## 2016-01-07 NOTE — Assessment & Plan Note (Signed)
Stable  Continue current regimen  

## 2016-01-07 NOTE — Progress Notes (Signed)
Subjective:    Patient ID: Calvin Butler, male    DOB: 03/17/1975, 40 y.o.   MRN: [id number]  CC: Calvin Butler is a 40 y.o. male who presents today for follow up.   HPI: HPI: [id number]:Compliant with diet. Not able to work out as much due to working 2 jobs. Averages 110. No blurry vision, numbness, tingling. HLD - compliant with zocor  Due for eye exam.    Still not smoking.     HISTORY:  Past Medical History:  Diagnosis Date  . Asthma   . Diabetes mellitus without complication (HCC)   . History of GI bleed    secondary to NSAIDs   Past Surgical History:  Procedure Laterality Date  . FACIAL FRACTURE SURGERY  at 40 yrs old   Right side face - due to accident  . FACIAL FRACTURE SURGERY    . PERCUTANEOUS CORONARY STENT INTERVENTION (PCI-S)  2015   Family History  Problem Relation Age of Onset  . Aneurysm Mother     Brain  . Diabetes Father   . Asthma Sister   . Diabetes Maternal Grandmother   . Arthritis Daughter   . Arthritis Daughter   . Prostate cancer Neg Hx     Allergies: Patient has no known allergies. Current Outpatient Prescriptions on File Prior to Visit  Medication Sig Dispense Refill  . albuterol (PROVENTIL HFA;VENTOLIN HFA) 108 (90 Base) MCG/ACT inhaler Inhale 2 puffs into the lungs every 6 (six) hours as needed for wheezing or shortness of breath. 1 Inhaler 2  . glipiZIDE (GLUCOTROL) 10 MG tablet Take 1 tablet (10 mg total) by mouth daily before breakfast. 90 tablet 3  . ibuprofen (ADVIL,MOTRIN) 200 MG tablet Take 400-800 mg by mouth every 6 (six) hours as needed.    . metFORMIN (GLUCOPHAGE) 500 MG tablet TAKE TWO TABLETS BY MOUTH TWICE DAILY WITH MEALS 120 tablet 1  . neomycin-polymyxin-hydrocortisone (CORTISPORIN) otic solution Place 4 drops into the left ear 4 (four) times daily. 10 mL 0  . simvastatin (ZOCOR) 40 MG tablet Take 1 tablet (40 mg total) by mouth daily. 90 tablet 3  . sitaGLIPtin (JANUVIA) 100 MG tablet Take 1 tablet (100 mg total)  by mouth daily. 30 tablet 3   No current facility-administered medications on file prior to visit.     Social History  Substance Use Topics  . Smoking status: Former Smoker    Packs/day: 1.00    Years: 27.00    Types: Cigarettes  . Smokeless tobacco: Never Used  . Alcohol use 0.0 oz/week     Comment: Occasional    Review of Systems  Constitutional: Negative for chills and fever.  Respiratory: Negative for cough.   Cardiovascular: Negative for chest pain and palpitations.  Gastrointestinal: Negative for nausea and vomiting.      Objective:    BP 130/60   Pulse 75   Temp 98 F (36.7 C) (Oral)   Ht 5\' 8"  (1.727 m)   Wt 238 lb (108 kg)   SpO2 94%   BMI 36.19 kg/m  BP Readings from Last 3 Encounters:  01/07/16 130/60  11/06/15 126/72  10/08/15 132/62   Wt Readings from Last 3 Encounters:  01/07/16 238 lb (108 kg)  11/06/15 234 lb 12 oz (106.5 kg)  10/08/15 234 lb 12.8 oz (106.5 kg)    Physical Exam  Constitutional: He appears well-developed and well-nourished.  Cardiovascular: Regular rhythm and normal heart sounds.   Pulmonary/Chest: Effort normal and breath sounds normal.  No respiratory distress. He has no wheezes. He has no rhonchi. He has no rales.  Neurological: He is alert.  Skin: Skin is warm and dry.  Psychiatric: He has a normal mood and affect. His speech is normal and behavior is normal.  Vitals reviewed.      Assessment & Plan:   Problem List Items Addressed This Visit      Endocrine   Diabetes (HCC) (Chronic)    Presume at goal based on reported values of blood sugar; will check a1c in 2 months, last a1c 7.2%.         Other   Hyperlipidemia    Stable. Continue current regimen          I am having Mr. Calvin Butler maintain his ibuprofen, albuterol, sitaGLIPtin, glipiZIDE, simvastatin, neomycin-polymyxin-hydrocortisone, and metFORMIN.   No orders of the defined types were placed in this encounter.   Return precautions given.    Risks, benefits, and alternatives of the medications and treatment plan prescribed today were discussed, and patient expressed understanding.   Education regarding symptom management and diagnosis given to patient on AVS.  Continue to follow with Calvin SamsJayce G Cook, DO for routine health maintenance.   Calvin Butler and I agreed with plan.   Calvin PlowmanMargaret Jarrel Knoke, FNP

## 2016-01-07 NOTE — Patient Instructions (Addendum)
Lab appt late January  Happy Holidays!!

## 2016-01-07 NOTE — Assessment & Plan Note (Signed)
Presume at goal based on reported values of blood sugar; will check a1c in 2 months, last a1c 7.2%.

## 2016-01-07 NOTE — Progress Notes (Deleted)
Subjective:    Patient ID: Calvin Butler, male    DOB: 03/18/1975, 40 y.o.   MRN: [id number]  CC: Calvin Butler is a 40 y.o. male who presents today for follow up.   HPI: [id number]:Compliant with diet. Not able to work out as much due to  Averages 110. No blurry vision, numbness, tingling. HLD HL  Due for eye exam.    Still not smoking.      HISTORY:  Past Medical History:  Diagnosis Date  . Asthma   . Diabetes mellitus without complication (HCC)   . History of GI bleed    secondary to NSAIDs   Past Surgical History:  Procedure Laterality Date  . FACIAL FRACTURE SURGERY  at 40 yrs old   Right side face - due to accident  . FACIAL FRACTURE SURGERY    . PERCUTANEOUS CORONARY STENT INTERVENTION (PCI-S)  2015   Family History  Problem Relation Age of Onset  . Aneurysm Mother     Brain  . Diabetes Father   . Asthma Sister   . Diabetes Maternal Grandmother   . Arthritis Daughter   . Arthritis Daughter   . Prostate cancer Neg Hx     Allergies: Patient has no known allergies. Current Outpatient Prescriptions on File Prior to Visit  Medication Sig Dispense Refill  . albuterol (PROVENTIL HFA;VENTOLIN HFA) 108 (90 Base) MCG/ACT inhaler Inhale 2 puffs into the lungs every 6 (six) hours as needed for wheezing or shortness of breath. 1 Inhaler 2  . DULoxetine (CYMBALTA) 20 MG capsule Take 20 mg by mouth once a day for one week. Then increase to 20 mg twice a day and stay at that dose thereafter. 60 capsule 2  . glipiZIDE (GLUCOTROL) 10 MG tablet Take 1 tablet (10 mg total) by mouth daily before breakfast. 90 tablet 3  . ibuprofen (ADVIL,MOTRIN) 200 MG tablet Take 400-800 mg by mouth every 6 (six) hours as needed.    . metFORMIN (GLUCOPHAGE) 500 MG tablet TAKE TWO TABLETS BY MOUTH TWICE DAILY WITH MEALS 120 tablet 1  . neomycin-polymyxin-hydrocortisone (CORTISPORIN) otic solution Place 4 drops into the left ear 4 (four) times daily. 10 mL 0  . simvastatin (ZOCOR) 40 MG tablet  Take 1 tablet (40 mg total) by mouth daily. 90 tablet 3  . sitaGLIPtin (JANUVIA) 100 MG tablet Take 1 tablet (100 mg total) by mouth daily. 30 tablet 3   No current facility-administered medications on file prior to visit.     Social History  Substance Use Topics  . Smoking status: Former Smoker    Packs/day: 1.00    Years: 27.00    Types: Cigarettes  . Smokeless tobacco: Never Used  . Alcohol use 0.0 oz/week     Comment: Occasional    Review of Systems  Constitutional: Negative for chills and fever.  HENT: Negative for congestion.   Respiratory: Negative for cough.   Cardiovascular: Negative for chest pain and palpitations.  Gastrointestinal: Negative for diarrhea and vomiting.  Skin: Negative for rash.  Hematological: Negative for adenopathy.      Objective:    There were no vitals taken for this visit. BP Readings from Last 3 Encounters:  01/07/16 130/60  11/06/15 126/72  10/08/15 132/62   Wt Readings from Last 3 Encounters:  01/07/16 238 lb (108 kg)  11/06/15 234 lb 12 oz (106.5 kg)  10/08/15 234 lb 12.8 oz (106.5 kg)    Physical Exam  Constitutional: He appears well-developed and well-nourished.  Cardiovascular: Regular rhythm and normal heart sounds.   Pulmonary/Chest: Effort normal and breath sounds normal. No respiratory distress. He has no wheezes. He has no rhonchi. He has no rales.  Neurological: He is alert.  Skin: Skin is warm and dry.  Psychiatric: He has a normal mood and affect. His speech is normal and behavior is normal.  Vitals reviewed.      Assessment & Plan:   Problem List Items Addressed This Visit      Endocrine   Diabetes (HCC) - Primary (Chronic)       I am having Calvin Butler maintain his ibuprofen, albuterol, sitaGLIPtin, glipiZIDE, simvastatin, neomycin-polymyxin-hydrocortisone, DULoxetine, and metFORMIN.   No orders of the defined types were placed in this encounter.   Return precautions given.   Risks, benefits, and  alternatives of the medications and treatment plan prescribed today were discussed, and patient expressed understanding.   Education regarding symptom management and diagnosis given to patient on AVS.  Continue to follow with Tommie SamsJayce G Cook, DO for routine health maintenance.   Calvin Butler and I agreed with plan.   Rennie PlowmanMargaret Jakeia Carreras, FNP

## 2016-01-07 NOTE — Progress Notes (Signed)
Pre visit review using our clinic review tool, if applicable. No additional management support is needed unless otherwise documented below in the visit note. 

## 2016-01-09 ENCOUNTER — Encounter: Payer: Self-pay | Admitting: Family

## 2016-01-09 NOTE — Progress Notes (Signed)
This encounter was created in error - please disregard.

## 2016-02-20 ENCOUNTER — Other Ambulatory Visit (INDEPENDENT_AMBULATORY_CARE_PROVIDER_SITE_OTHER): Payer: Self-pay

## 2016-02-20 DIAGNOSIS — E119 Type 2 diabetes mellitus without complications: Secondary | ICD-10-CM

## 2016-02-20 LAB — HEMOGLOBIN A1C: Hgb A1c MFr Bld: 8.4 % — ABNORMAL HIGH (ref 4.6–6.5)

## 2016-02-20 LAB — MICROALBUMIN / CREATININE URINE RATIO
Creatinine,U: 235.5 mg/dL
Microalb Creat Ratio: 0.4 mg/g (ref 0.0–30.0)
Microalb, Ur: 0.9 mg/dL (ref 0.0–1.9)

## 2016-02-21 LAB — HIV ANTIBODY (ROUTINE TESTING W REFLEX): HIV 1&2 Ab, 4th Generation: NONREACTIVE

## 2016-03-22 ENCOUNTER — Other Ambulatory Visit: Payer: Self-pay | Admitting: Family

## 2016-03-27 NOTE — Telephone Encounter (Signed)
Pt called and stated that he has been out of medication for the last couple of days. Please call when completed.Please advise, thank you!  Call pt @ [phone]  Pharmacy - [id number] - [place] ([place]), [place] - [address]

## 2016-05-06 ENCOUNTER — Other Ambulatory Visit: Payer: Self-pay

## 2016-05-06 DIAGNOSIS — E119 Type 2 diabetes mellitus without complications: Secondary | ICD-10-CM

## 2016-05-06 MED ORDER — SITAGLIPTIN PHOSPHATE 100 MG PO TABS: 100.0000 mg | ORAL_TABLET | Freq: Every day | ORAL | 3 refills | Status: DC

## 2016-05-06 NOTE — Telephone Encounter (Signed)
Medication has been refilled.

## 2016-05-26 ENCOUNTER — Ambulatory Visit (INDEPENDENT_AMBULATORY_CARE_PROVIDER_SITE_OTHER): Payer: Commercial Managed Care - PPO | Admitting: Family

## 2016-05-26 VITALS — BP 126/64 | HR 74 | Temp 98.0°F | Ht 68.0 in | Wt 225.4 lb

## 2016-05-26 DIAGNOSIS — Z87891 Personal history of nicotine dependence: Secondary | ICD-10-CM

## 2016-05-26 DIAGNOSIS — E119 Type 2 diabetes mellitus without complications: Secondary | ICD-10-CM

## 2016-05-26 DIAGNOSIS — J45909 Unspecified asthma, uncomplicated: Secondary | ICD-10-CM

## 2016-05-26 LAB — POCT GLYCOSYLATED HEMOGLOBIN (HGB A1C): Hemoglobin A1C: 10.1

## 2016-05-26 MED ORDER — BUPROPION HCL ER (SMOKING DET) 150 MG PO TB12: 150.0000 mg | ORAL_TABLET | Freq: Two times a day (BID) | ORAL | 2 refills | Status: DC

## 2016-05-26 MED ORDER — ALBUTEROL SULFATE HFA 108 (90 BASE) MCG/ACT IN AERS: 2.0000 | INHALATION_SPRAY | Freq: Four times a day (QID) | RESPIRATORY_TRACT | 2 refills | Status: DC | PRN

## 2016-05-26 MED ORDER — EMPAGLIFLOZIN 10 MG PO TABS: 10.0000 mg | ORAL_TABLET | Freq: Every day | ORAL | 2 refills | Status: DC

## 2016-05-26 NOTE — Patient Instructions (Addendum)
  Select a quit date within 5-7 days of starting Zyban.   Follow up 3 months  Good luck!!!    Bupropion SR (Zyban) Bupropion is only available by prescription from your doctor. It can help balance chemicals in your brain to reduce your withdrawal symptoms.  Bupropion is one of two prescription medications covered under the [facility]. Advantages Buproprion (Zyban) is a prescription-only drug treatment that works by changing the brain's response to nicotine. It makes smoking less pleasurable and reduces craving while you work on your smoking habits. Other advantages include: It doubles your chances of quitting successfully.  It doesn't contain nicotine, which is important to some people.  It can delay weight gain after quitting for some people. Common side effects Dry mouth  Insomnia  Headache  Shakiness or nervousness  Weight loss How to use Bupropion is available as a pill that you take by mouth. You will need a prescription from your doctor. Start taking bupropion about two weeks before you quit smoking. This builds up the level of medicine in your body.  Continue to smoke as usual while taking bupropion until your quit date.  The recommended dose of bupropion to help you quit smoking is:  Days 1-3: Take one 150mg  tablet once a day in the morning.  Day 4 to the end of treatment: Take one 150 mg tablet twice a day, once in the morning and once in the early evening (at least 8 hours between doses).  The recommended length of bupropion therapy is seven to twelve weeks. You can take bupropion with or without food. Warnings & precautions As with any prescription medication, there are benefits and risks to taking bupropion. [facility] [facility] has determined that for bupropion the benefits outweigh the risks. Do not use bupropion if one of the following applies to you: Currently using bupropion for another reason (such as Wellbutrin for depression)  Have an allergy to  bupropion  Have a seizure disorder  Have an eating disorder, such as anorexia or bulimia  Heavy alcohol drinker who plans to quit drinking abruptly  Currently use or recently (in the last two weeks) have used a class of medications called Monoamine Oxidase Inhibitors (MAO-I)  Currently using a drug called thioridazine Speak to a doctor or pharmacist about whether bupropion is right for you if one of the following applies to you: Liver or kidney problems  Pregnant or breast-feeding  High blood pressure  Less than 42 years old  Currently taking medications that can increase your risk of seizures Stop bupropion and speak to your doctor or healthcare provider immediately if you experience: Agitation  Depressed mood  Suicidal thoughts or    Follow up in 3 months.

## 2016-05-26 NOTE — Assessment & Plan Note (Addendum)
A1c has increased to 10. Will start jardiance. follow up with kelsi, pharmacist. f/u 3 months

## 2016-05-26 NOTE — Progress Notes (Signed)
Pre visit review using our clinic review tool, if applicable. No additional management support is needed unless otherwise documented below in the visit note. 

## 2016-05-26 NOTE — Progress Notes (Signed)
Subjective:    Patient ID: Calvin Butler, male    DOB: 05/15/1975, 41 y.o.   MRN: [id number]  CC: Calvin Butler is a 41 y.o. male who presents today for follow up.   HPI: DM- not checking sugars, has been loosing intentional weight through diet. No hypoglycemic episodes. Feels well. Compliant with medication   Has started smoking again. Would l ike to quit.   Needs inhaler refilled for 'asthma'; occasionally needs for shortness of breath. None today.     HISTORY:  Past Medical History:  Diagnosis Date  . Asthma   . Diabetes mellitus without complication (HCC)   . History of GI bleed    secondary to NSAIDs   Past Surgical History:  Procedure Laterality Date  . FACIAL FRACTURE SURGERY  at 41 yrs old   Right side face - due to accident  . FACIAL FRACTURE SURGERY    . PERCUTANEOUS CORONARY STENT INTERVENTION (PCI-S)  2015   Family History  Problem Relation Age of Onset  . Aneurysm Mother     Brain  . Diabetes Father   . Asthma Sister   . Diabetes Maternal Grandmother   . Arthritis Daughter   . Arthritis Daughter   . Prostate cancer Neg Hx     Allergies: Patient has no known allergies. Current Outpatient Prescriptions on File Prior to Visit  Medication Sig Dispense Refill  . glipiZIDE (GLUCOTROL) 10 MG tablet Take 1 tablet (10 mg total) by mouth daily before breakfast. 90 tablet 3  . ibuprofen (ADVIL,MOTRIN) 200 MG tablet Take 400-800 mg by mouth every 6 (six) hours as needed.    . metFORMIN (GLUCOPHAGE) 500 MG tablet TAKE TWO TABLETS BY MOUTH TWICE DAILY WITH MEALS 120 tablet 1  . simvastatin (ZOCOR) 40 MG tablet Take 1 tablet (40 mg total) by mouth daily. 90 tablet 3  . sitaGLIPtin (JANUVIA) 100 MG tablet Take 1 tablet (100 mg total) by mouth daily. 30 tablet 3   No current facility-administered medications on file prior to visit.     Social History  Substance Use Topics  . Smoking status: Former Smoker    Packs/day: 1.00    Years: 27.00    Types:  Cigarettes  . Smokeless tobacco: Never Used  . Alcohol use 0.0 oz/week     Comment: Occasional    Review of Systems  Constitutional: Negative for chills and fever.  Respiratory: Negative for cough.   Cardiovascular: Negative for chest pain and palpitations.  Gastrointestinal: Negative for nausea and vomiting.      Objective:    BP 126/64   Pulse 74   Temp 98 F (36.7 C) (Oral)   Ht 5\' 8"  (1.727 m)   Wt 225 lb 6.4 oz (102.2 kg)   SpO2 95%   BMI 34.27 kg/m  BP Readings from Last 3 Encounters:  05/26/16 126/64  01/07/16 130/60  11/06/15 126/72   Wt Readings from Last 3 Encounters:  05/26/16 225 lb 6.4 oz (102.2 kg)  01/07/16 238 lb (108 kg)  11/06/15 234 lb 12 oz (106.5 kg)    Physical Exam  Constitutional: He appears well-developed and well-nourished.  Cardiovascular: Regular rhythm and normal heart sounds.   Pulmonary/Chest: Effort normal and breath sounds normal. No respiratory distress. He has no wheezes. He has no rhonchi. He has no rales.  Neurological: He is alert.  Skin: Skin is warm and dry.  Psychiatric: He has a normal mood and affect. His speech is normal and behavior is normal.  Vitals reviewed.      Assessment & Plan:   Problem List Items Addressed This Visit      Respiratory   Uncomplicated asthma - Primary    Refilled inhaler for prn use as requested.       Relevant Medications   albuterol (PROVENTIL HFA;VENTOLIN HFA) 108 (90 Base) MCG/ACT inhaler     Endocrine   Diabetes (HCC) (Chronic)    A1c has increased to 10. Will start jiardance. follow up with kelsi, pharmacist. f/u 3 months      Relevant Medications   empagliflozin (JARDIANCE) 10 MG TABS tablet   Other Relevant Orders   POCT HgB A1C (Completed)     Other   History of smoking    Trial zyban to help with cessation.       Relevant Medications   buPROPion (ZYBAN) 150 MG 12 hr tablet       I have discontinued Mr. Layton neomycin-polymyxin-hydrocortisone. I am also  having him start on buPROPion and empagliflozin. Additionally, I am having him maintain his ibuprofen, glipiZIDE, simvastatin, metFORMIN, sitaGLIPtin, and albuterol.   Meds ordered this encounter  Medications  . DISCONTD: albuterol (PROVENTIL HFA;VENTOLIN HFA) 108 (90 Base) MCG/ACT inhaler    Sig: Inhale 2 puffs into the lungs every 6 (six) hours as needed for wheezing or shortness of breath.    Dispense:  1 Inhaler    Refill:  2    Order Specific Question:   Supervising Provider    Answer:   Darrick Huntsman, TERESA L [[id number]]  . buPROPion (ZYBAN) 150 MG 12 hr tablet    Sig: Take 1 tablet (150 mg total) by mouth 2 (two) times daily. Start 150 mg PO qd x 3 days, last dose no later than 6pm; stop smoking after 5-7 days of treatment    Dispense:  30 tablet    Refill:  2    Order Specific Question:   Supervising Provider    Answer:   Duncan Dull L [[id number]]  . empagliflozin (JARDIANCE) 10 MG TABS tablet    Sig: Take 10 mg by mouth daily.    Dispense:  30 tablet    Refill:  2    Order Specific Question:   Supervising Provider    Answer:   Duncan Dull L [[id number]]  . albuterol (PROVENTIL HFA;VENTOLIN HFA) 108 (90 Base) MCG/ACT inhaler    Sig: Inhale 2 puffs into the lungs every 6 (six) hours as needed for wheezing or shortness of breath.    Dispense:  1 Inhaler    Refill:  2    Order Specific Question:   Supervising Provider    Answer:   Sherlene Shams [[id number]]    Return precautions given.   Risks, benefits, and alternatives of the medications and treatment plan prescribed today were discussed, and patient expressed understanding.   Education regarding symptom management and diagnosis given to patient on AVS.  Continue to follow with Allegra Grana, FNP for routine health maintenance.   Calvin Butler and I agreed with plan.   Rennie Plowman, FNP

## 2016-05-27 ENCOUNTER — Encounter: Payer: Self-pay | Admitting: Family

## 2016-05-27 ENCOUNTER — Telehealth: Payer: Self-pay | Admitting: *Deleted

## 2016-05-27 DIAGNOSIS — J45909 Unspecified asthma, uncomplicated: Secondary | ICD-10-CM | POA: Insufficient documentation

## 2016-05-27 DIAGNOSIS — E119 Type 2 diabetes mellitus without complications: Secondary | ICD-10-CM

## 2016-05-27 DIAGNOSIS — Z87891 Personal history of nicotine dependence: Secondary | ICD-10-CM | POA: Insufficient documentation

## 2016-05-27 NOTE — Assessment & Plan Note (Signed)
Refilled inhaler for prn use as requested.

## 2016-05-27 NOTE — Telephone Encounter (Signed)
Patient stated that his insurance rejected Jardiance . Patient requested a call  Patient was to start Wellbutrin , however the pharmacy did not receive script. [facility]  Pt contact [phone]

## 2016-05-27 NOTE — Assessment & Plan Note (Signed)
Trial zyban to help with cessation.

## 2016-05-28 ENCOUNTER — Telehealth: Payer: Self-pay | Admitting: Family

## 2016-05-28 DIAGNOSIS — Z87891 Personal history of nicotine dependence: Secondary | ICD-10-CM

## 2016-05-28 MED ORDER — BUPROPION HCL ER (SMOKING DET) 150 MG PO TB12: 150.0000 mg | ORAL_TABLET | Freq: Two times a day (BID) | ORAL | 2 refills | Status: DC

## 2016-05-28 MED ORDER — CANAGLIFLOZIN 100 MG PO TABS: 100.0000 mg | ORAL_TABLET | Freq: Every day | ORAL | 2 refills | Status: DC

## 2016-05-28 NOTE — Telephone Encounter (Signed)
We could do a PA but more than likely he will have a high co-pay but there is a savings card he can use but will have to activate on line gave to LyndonBrock. The co-pay may be 0.

## 2016-05-28 NOTE — Telephone Encounter (Signed)
Patient has been notified and had no questions, comments, or concerns at this time.

## 2016-05-28 NOTE — Telephone Encounter (Signed)
Pharmacy called and left a voicemail stating that they need clarification on pt's buPROPion (ZYBAN) 150 MG 12 hr tablet. Please advise, thank you!  Pharmacy - [id number] - [place] ([address]), [place] - [address]

## 2016-05-28 NOTE — Telephone Encounter (Signed)
Calvin MessierKathy, know of any way to get jardiance approved??  Calvin AmabileBrock,   Let patient know we are working on jardiance and have faxed over [facility] rx to [facility].

## 2016-05-28 NOTE — Telephone Encounter (Signed)
Please advise 

## 2016-05-28 NOTE — Telephone Encounter (Signed)
Invokana is likely the preferred SGLT2 inhibitor for his plan.  Can we switch to Invokana 100 mg daily?

## 2016-05-28 NOTE — Telephone Encounter (Signed)
[facility] mg daily [facility].  Will stop Jardiance.  Attempted calling patient but voicemail is full.    Hazle NordmannKelsy Combs, PharmD, BCPS [facility] PGY2 Pharmacy Resident [phone]

## 2016-05-28 NOTE — Telephone Encounter (Signed)
Medication has been refilled.

## 2016-05-28 NOTE — Telephone Encounter (Signed)
Sure Clintonkelsey, and thank you!  Do you mind prescribing and giving him a call?

## 2016-06-01 ENCOUNTER — Encounter: Payer: Self-pay | Admitting: Intensive Care

## 2016-06-01 ENCOUNTER — Emergency Department
Admission: EM | Admit: 2016-06-01 | Discharge: 2016-06-01 | Disposition: A | Discharge status: Home / Self Care | Payer: Commercial Managed Care - PPO | Attending: Emergency Medicine | Admitting: Emergency Medicine

## 2016-06-01 ENCOUNTER — Emergency Department: Payer: Commercial Managed Care - PPO

## 2016-06-01 DIAGNOSIS — Z791 Long term (current) use of non-steroidal anti-inflammatories (NSAID): Secondary | ICD-10-CM | POA: Diagnosis not present

## 2016-06-01 DIAGNOSIS — Z79899 Other long term (current) drug therapy: Secondary | ICD-10-CM | POA: Insufficient documentation

## 2016-06-01 DIAGNOSIS — J45909 Unspecified asthma, uncomplicated: Secondary | ICD-10-CM | POA: Diagnosis not present

## 2016-06-01 DIAGNOSIS — Z7984 Long term (current) use of oral hypoglycemic drugs: Secondary | ICD-10-CM | POA: Insufficient documentation

## 2016-06-01 DIAGNOSIS — R1031 Right lower quadrant pain: Secondary | ICD-10-CM | POA: Insufficient documentation

## 2016-06-01 DIAGNOSIS — R109 Unspecified abdominal pain: Secondary | ICD-10-CM

## 2016-06-01 DIAGNOSIS — E119 Type 2 diabetes mellitus without complications: Secondary | ICD-10-CM | POA: Insufficient documentation

## 2016-06-01 DIAGNOSIS — F17210 Nicotine dependence, cigarettes, uncomplicated: Secondary | ICD-10-CM | POA: Insufficient documentation

## 2016-06-01 LAB — URINALYSIS, COMPLETE (UACMP) WITH MICROSCOPIC
Bacteria, UA: NONE SEEN
Bilirubin Urine: NEGATIVE
Glucose, UA: >=500 mg/dL — AB
Hgb urine dipstick: NEGATIVE
Ketones, ur: NEGATIVE mg/dL
Leukocytes, UA: NEGATIVE
Nitrite: NEGATIVE
Protein, ur: NEGATIVE mg/dL
Specific Gravity, Urine: 1.029 (ref 1.005–1.030)
pH: 5 (ref 5.0–8.0)

## 2016-06-01 LAB — COMPREHENSIVE METABOLIC PANEL
ALT: 26 U/L (ref 17–63)
AST: 21 U/L (ref 15–41)
Albumin: 4.4 g/dL (ref 3.5–5.0)
Alkaline Phosphatase: 68 U/L (ref 38–126)
Anion gap: 8 (ref 5–15)
BUN: 25 mg/dL — ABNORMAL HIGH (ref 6–20)
CO2: 22 mmol/L (ref 22–32)
Calcium: 9.4 mg/dL (ref 8.9–10.3)
Chloride: 108 mmol/L (ref 101–111)
Creatinine, Ser: 1.19 mg/dL (ref 0.61–1.24)
GFR calc Af Amer: >60 mL/min (ref >60)
GFR calc non Af Amer: >60 mL/min (ref >60)
Glucose, Bld: 160 mg/dL — ABNORMAL HIGH (ref 65–99)
Potassium: 4.4 mmol/L (ref 3.5–5.1)
Sodium: 138 mmol/L (ref 135–145)
Total Bilirubin: 0.6 mg/dL (ref 0.3–1.2)
Total Protein: 7.7 g/dL (ref 6.5–8.1)

## 2016-06-01 LAB — LIPASE, BLOOD: Lipase: 31 U/L (ref 11–51)

## 2016-06-01 LAB — CBC
HCT: 52.1 % — ABNORMAL HIGH (ref 40.0–52.0)
Hemoglobin: 17.7 g/dL (ref 13.0–18.0)
MCH: 30.8 pg (ref 26.0–34.0)
MCHC: 33.9 g/dL (ref 32.0–36.0)
MCV: 90.8 fL (ref 80.0–100.0)
Platelets: 249 10*3/uL (ref 150–440)
RBC: 5.74 MIL/uL (ref 4.40–5.90)
RDW: 13.4 % (ref 11.5–14.5)
WBC: 14.3 10*3/uL — ABNORMAL HIGH (ref 3.8–10.6)

## 2016-06-01 LAB — GLUCOSE, CAPILLARY: Glucose-Capillary: 167 mg/dL — ABNORMAL HIGH (ref 65–99)

## 2016-06-01 MED ORDER — DICYCLOMINE HCL 20 MG PO TABS: 20.0000 mg | ORAL_TABLET | Freq: Three times a day (TID) | ORAL | 0 refills | Status: DC | PRN

## 2016-06-01 MED ORDER — IOPAMIDOL (ISOVUE-300) INJECTION 61%
100.0000 mL | Freq: Once | INTRAVENOUS | Status: AC | PRN
  Administered 2016-06-01: 100 mL via INTRAVENOUS
  Filled 2016-06-01: qty 100

## 2016-06-01 MED ORDER — IOPAMIDOL (ISOVUE-300) INJECTION 61%
30.0000 mL | Freq: Once | INTRAVENOUS | Status: AC | PRN
  Administered 2016-06-01: 30 mL via ORAL
  Filled 2016-06-01: qty 30

## 2016-06-01 NOTE — Discharge Instructions (Signed)
Please seek medical attention for any high fevers, chest pain, shortness of breath, change in behavior, persistent vomiting, bloody stool or any other new or concerning symptoms.  

## 2016-06-01 NOTE — ED Provider Notes (Signed)
[facility] Emergency Department Provider Note   ____________________________________________   I have reviewed the triage vital signs and the nursing notes.   HISTORY  Chief Complaint Abdominal Pain   History limited by: Not Limited   HPI Calvin Butler is a 41 y.o. male who presents to the emergency department today because of concern for abdominal pain. It is located in the right lower quadrant. It has not radiated. The pain started yesterday and got progressively worse today. Patient states that he started a new medication 2 days ago. He is concerned this might be a side effect of medication since he is also having some blurred vision. Patient denies any measured fevers.    Past Medical History:  Diagnosis Date  . Asthma   . Diabetes mellitus without complication (HCC)   . History of GI bleed    secondary to NSAIDs    Patient Active Problem List   Diagnosis Date Noted  . History of smoking 05/27/2016  . Uncomplicated asthma 05/27/2016  . Chronic fatigue 05/31/2015  . Diabetes (HCC) 08/23/2014  . Otitis externa 02/11/2012  . Hyperlipidemia 05/23/2011  . Obesity 02/17/2011    Past Surgical History:  Procedure Laterality Date  . FACIAL FRACTURE SURGERY  at 41 yrs old   Right side face - due to accident  . FACIAL FRACTURE SURGERY    . PERCUTANEOUS CORONARY STENT INTERVENTION (PCI-S)  2015    Prior to Admission medications   Medication Sig Start Date End Date Taking? Authorizing Provider  albuterol (PROVENTIL HFA;VENTOLIN HFA) 108 (90 Base) MCG/ACT inhaler Inhale 2 puffs into the lungs every 6 (six) hours as needed for wheezing or shortness of breath. 05/26/16   Allegra GranaArnett, Margaret G, FNP  buPROPion (ZYBAN) 150 MG 12 hr tablet Take 1 tablet (150 mg total) by mouth 2 (two) times daily. Start 150 mg PO qd x 3 days, last dose no later than 6pm; stop smoking after 5-7 days of treatment 05/28/16   Allegra GranaArnett, Margaret G, FNP  canagliflozin [facility](INVOKANA) 100  MG TABS tablet Take 1 tablet (100 mg total) by mouth daily before breakfast. 05/28/16   Allegra GranaArnett, Margaret G, FNP  glipiZIDE (GLUCOTROL) 10 MG tablet Take 1 tablet (10 mg total) by mouth daily before breakfast. 09/03/15   Allegra GranaArnett, Margaret G, FNP  ibuprofen (ADVIL,MOTRIN) 200 MG tablet Take 400-800 mg by mouth every 6 (six) hours as needed.    [provider]  metFORMIN (GLUCOPHAGE) 500 MG tablet TAKE TWO TABLETS BY MOUTH TWICE DAILY WITH MEALS 03/28/16   Allegra GranaArnett, Margaret G, FNP  simvastatin (ZOCOR) 40 MG tablet Take 1 tablet (40 mg total) by mouth daily. 09/03/15   Allegra GranaArnett, Margaret G, FNP  sitaGLIPtin (JANUVIA) 100 MG tablet Take 1 tablet (100 mg total) by mouth daily. 05/06/16   Allegra GranaArnett, Margaret G, FNP    Allergies Patient has no known allergies.  Family History  Problem Relation Age of Onset  . Aneurysm Mother        Brain  . Diabetes Father   . Asthma Sister   . Diabetes Maternal Grandmother   . Arthritis Daughter   . Arthritis Daughter   . Prostate cancer Neg Hx     Social History Social History  Substance Use Topics  . Smoking status: Current Every Day Smoker    Packs/day: 1.00    Years: 27.00    Types: Cigarettes  . Smokeless tobacco: Never Used  . Alcohol use 0.0 oz/week     Comment: Occasional  Review of Systems Constitutional: No fever/chills Eyes: No visual changes. ENT: No sore throat. Cardiovascular: Denies chest pain. Respiratory: Denies shortness of breath. Gastrointestinal: Positive for right lower quadrant pain. Genitourinary: Negative for dysuria. Musculoskeletal: Negative for back pain. Skin: Negative for rash. Neurological: Negative for headaches, focal weakness or numbness.  ____________________________________________   PHYSICAL EXAM:  VITAL SIGNS: ED Triage Vitals  Enc Vitals Group     BP 06/01/16 1613 128/65     Pulse Rate 06/01/16 1613 82     Resp 06/01/16 1613 16     Temp 06/01/16 1613 98.2 F (36.8 C)     Temp Source 06/01/16  1613 Oral     SpO2 06/01/16 1613 96 %     Weight 06/01/16 1613 220 lb (99.8 kg)     Height 06/01/16 1613 5\' 6"  (1.676 m)     Head Circumference --      Peak Flow --      Pain Score 06/01/16 1620 3   Constitutional: Alert and oriented. Well appearing and in no distress. Eyes: Conjunctivae are normal. Normal extraocular movements. ENT   Head: Normocephalic and atraumatic.   Nose: No congestion/rhinnorhea.   Mouth/Throat: Mucous membranes are moist.   Neck: No stridor. Hematological/Lymphatic/Immunilogical: No cervical lymphadenopathy. Cardiovascular: Normal rate, regular rhythm.  No murmurs, rubs, or gallops.  Respiratory: Normal respiratory effort without tachypnea nor retractions. Breath sounds are clear and equal bilaterally. No wheezes/rales/rhonchi. Gastrointestinal: Soft and slightly tender in the right lower quadrant. No rebound. No guarding.  Genitourinary: Deferred Musculoskeletal: Normal range of motion in all extremities. No lower extremity edema. Neurologic:  Normal speech and language. No gross focal neurologic deficits are appreciated.  Skin:  Skin is warm, dry and intact. No rash noted. Psychiatric: Mood and affect are normal. Speech and behavior are normal. Patient exhibits appropriate insight and judgment.  ____________________________________________    LABS (pertinent positives/negatives)  Labs Reviewed  COMPREHENSIVE METABOLIC PANEL - Abnormal; Notable for the following:       Result Value   Glucose, Bld 160 (*)    BUN 25 (*)    All other components within normal limits  CBC - Abnormal; Notable for the following:    WBC 14.3 (*)    HCT 52.1 (*)    All other components within normal limits  URINALYSIS, COMPLETE (UACMP) WITH MICROSCOPIC - Abnormal; Notable for the following:    Color, Urine YELLOW (*)    APPearance CLEAR (*)    Glucose, UA >=500 (*)    Squamous Epithelial / LPF 0-5 (*)    All other components within normal limits  GLUCOSE,  CAPILLARY - Abnormal; Notable for the following:    Glucose-Capillary 167 (*)    All other components within normal limits  LIPASE, BLOOD     ____________________________________________   EKG  None  ____________________________________________    RADIOLOGY  CT abd/pel IMPRESSION: No evidence bowel obstruction. Normal appendix.  No CT findings to account for the patient's right lower quadrant abdominal pain.  ____________________________________________   PROCEDURES  Procedures  ____________________________________________   INITIAL IMPRESSION / ASSESSMENT AND PLAN / ED COURSE  Pertinent labs & imaging results that were available during my care of the patient were reviewed by me and considered in my medical decision making (see chart for details).  Patient presented to the emergency department today because of concerns for abdominal pain. The patient does think that is related to medication. Patient did have a mild leukocytosis raising concerns for possible appendicitis. CT abdomen and  pelvis was obtained which not show any concerning findings.  ____________________________________________   FINAL CLINICAL IMPRESSION(S) / ED DIAGNOSES  Final diagnoses:  Abdominal pain, unspecified abdominal location     Note: This dictation was prepared with Dragon dictation. Any transcriptional errors that result from this process are unintentional     Phineas Semen, MD 06/01/16 1935

## 2016-06-01 NOTE — ED Triage Notes (Addendum)
Patient presents to ER with RLQ pain and blurry vision that started yesterday afternoon. Patient reports he started Invokana on Friday. Reports some burning with urination. Denies abnormal d/c

## 2016-07-07 ENCOUNTER — Encounter: Payer: Self-pay | Admitting: Pharmacist

## 2016-07-07 ENCOUNTER — Ambulatory Visit (INDEPENDENT_AMBULATORY_CARE_PROVIDER_SITE_OTHER): Payer: Commercial Managed Care - PPO | Admitting: Pharmacist

## 2016-07-07 DIAGNOSIS — Z87891 Personal history of nicotine dependence: Secondary | ICD-10-CM

## 2016-07-07 DIAGNOSIS — E119 Type 2 diabetes mellitus without complications: Secondary | ICD-10-CM

## 2016-07-07 MED ORDER — BUPROPION HCL ER (XL) 150 MG PO TB24: 150.0000 mg | ORAL_TABLET | Freq: Every day | ORAL | 1 refills | Status: DC

## 2016-07-07 MED ORDER — METFORMIN HCL 500 MG PO TABS: 1000.0000 mg | ORAL_TABLET | Freq: Two times a day (BID) | ORAL | 1 refills | Status: DC

## 2016-07-07 MED ORDER — CANAGLIFLOZIN 300 MG PO TABS: 300.0000 mg | ORAL_TABLET | Freq: Every day | ORAL | 1 refills | Status: DC

## 2016-07-07 NOTE — Patient Instructions (Addendum)
Increase Invokana to 300 mg daily (you make take 3 of the 100 mg tablets daily until you finish current supply)  Stop Wellbutrin (bupropion) SR.  Start Wellbutrin XL (bupropion XL) 150 mg daily  Continue checking your blood sugars at least once daily  Followup with Rennie PlowmanMargaret Arnett, FNP in August.

## 2016-07-07 NOTE — Progress Notes (Addendum)
S:     Chief Complaint  Patient presents with  . Medication Management    Diabetes    Patient arrives in good spirits ambulating without assistance.  Presents for diabetes evaluation, education, and management at the request of Calvin Hitch, FNP. Patient was referred on 05/26/16.  Patient was last seen by Primary Care Provider on 05/26/16.  Patient was seen in ED on 06/01/16 with abdominal pain and today he states he still has some abdominal pain occasionally.  Its rare and he doesn't know if it's something that he eats.  Denies changes in stool.    Currently working night shift and has been checking his CBGs randomly   Patient reports Diabetes was diagnosed in ~2015.   Family/Social History: Father/Aunt (diabetes)  Patient reports adherence with medications most of the time.  Reports he has been out of metformin, glipizide and Invokana for 1-2 weeks. Has had trouble taking Bupropion second doses and metformin second doses.   Current diabetes medications include: Invokana 100 mg daily, glipizide 10 mg daily, metformin 1000 mg BID, Januvia 100 mg daily   Patient denies hypoglycemic events.  Patient reported dietary habits: Eats 1-2 meals/day.  States he doesn't eat a lot.  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  Patient denies neuropathy. Patient reports visual changes with blurred vision.  Reports some improvement with vision since CBGs improved.   Patient reports self foot exams. Denies changes.   Smoking Cessation Has not decreased cigarette use.  Currently smoking 1-1.5 ppd currently.   Biggest triggers for him are stress and boredome Wife also smokes which makes it hard for him.    Has previously tried NRT patch, inhaler.  Cannot use gum.   Reports shortness of  breath 1x every other day.    O:  Physical Exam  Vitals reviewed.  Review of Systems  Constitutional: Negative.    Lab Results  Component Value Date   HGBA1C 10.1 05/26/2016   Lipid Panel     Component Value Date/Time   CHOL 178 09/03/2015 0947   TRIG (H) 09/03/2015 0947    686.0 Triglyceride is over 400; calculations on Lipids are invalid.   HDL 27.50 (L) 09/03/2015 0947   CHOLHDL 6 09/03/2015 0947   VLDL 72.0 (H) 02/21/2015 0926   LDLCALC NOT CALC 11/17/2014 1632   LDLDIRECT 78.0 09/03/2015 0947   Vitals:   07/07/16 1324  BP: 104/73  Pulse: 85    Random CBGs:  14 day average (n=2) 292 mg/dL 30 day average (K=44) 010 mg/dL   A/P: Diabetes longstanding diagnosed currently uncontrolled with improved random CBGs. Patient denies hypoglycemic events and is able to verbalize appropriate hypoglycemia management plan. Patient reports adherence with medication. Control is suboptimal due to diet and medication adherence as patient often misses second dose of metformin. Following discussion and approval by Calvin Plowman, FNP, the following medication changes were made:  -Increase Invokana to 300 mg daily -Continue metformin 1000 mg BID -Continue Januvia 100 mg daily  -Continue Glipizide 10 mg daily - Consider switching at next visit to Glipizide XL 10 mg daily for 24 hour control of CBGs. -Discussed low carbohydrate diet and increasing exercise -Instructed patient to check fasting CBGs not random CBGs -Next A1C anticipated August 2018.    ASCVD risk greater than 7.5%.  Continued simvastatin 40 mg daily.  Patient not currently candidate for ASA given age.   Smoking Cessation: Patient currently continuing to smoke 1-1.5 PPD despite bupropion.  Discussed stress management and strategies to keep him busy as this is biggest smoking trigger.  Will trial Bupropion XL 150 mg daily as patient has been non adherent to BID dosing.     Written patient instructions provided.  Total time  in face to face counseling 60 minutes.   Follow up with Calvin PlowmanMargaret Arnett, FNP in August    Patient was seen with Calvin PlowmanMargaret Arnett, FNP today in clinic and medication changes were discussed and approved prior to initiation  Agree with plan, margaret, NP

## 2016-07-08 ENCOUNTER — Other Ambulatory Visit: Payer: Self-pay

## 2016-07-08 ENCOUNTER — Encounter: Payer: Self-pay | Admitting: Pharmacist

## 2016-07-08 DIAGNOSIS — E119 Type 2 diabetes mellitus without complications: Secondary | ICD-10-CM

## 2016-07-08 MED ORDER — ONETOUCH ULTRASOFT LANCETS MISC: 2 refills | Status: AC

## 2016-07-08 MED ORDER — GLUCOSE BLOOD VI STRP: ORAL_STRIP | 2 refills | Status: AC

## 2016-07-08 MED ORDER — METFORMIN HCL 500 MG PO TABS: 1000.0000 mg | ORAL_TABLET | Freq: Two times a day (BID) | ORAL | 0 refills | Status: DC

## 2016-07-08 NOTE — Assessment & Plan Note (Signed)
  Smoking Cessation: Patient currently continuing to smoke 1-1.5 PPD despite bupropion.  Discussed stress management and strategies to keep him busy as this is biggest smoking trigger.  Will trial Bupropion XL 150 mg daily as patient has been non adherent to BID dosing.

## 2016-07-08 NOTE — Assessment & Plan Note (Signed)
Diabetes longstanding diagnosed currently uncontrolled with improved random CBGs. Patient denies hypoglycemic events and is able to verbalize appropriate hypoglycemia management plan. Patient reports adherence with medication. Control is suboptimal due to diet and medication adherence as patient often misses second dose of metformin. Following discussion and approval by Rennie PlowmanMargaret Arnett, FNP, the following medication changes were made:  -Increase Invokana to 300 mg daily -Continue metformin 1000 mg BID -Continue Januvia 100 mg daily  -Continue Glipizide 10 mg daily - Consider switching at next visit to Glipizide XL 10 mg daily for 24 hour control of CBGs. -Discussed low carbohydrate diet and increasing exercise -Instructed patient to check fasting CBGs not random CBGs -Next A1C anticipated August 2018.

## 2016-08-27 ENCOUNTER — Encounter: Payer: Self-pay | Admitting: Family

## 2016-08-27 ENCOUNTER — Ambulatory Visit (INDEPENDENT_AMBULATORY_CARE_PROVIDER_SITE_OTHER): Payer: Commercial Managed Care - PPO | Admitting: Family

## 2016-08-27 VITALS — BP 118/74 | HR 80 | Temp 97.7°F | Ht 66.0 in | Wt 222.0 lb

## 2016-08-27 DIAGNOSIS — E785 Hyperlipidemia, unspecified: Secondary | ICD-10-CM

## 2016-08-27 DIAGNOSIS — E119 Type 2 diabetes mellitus without complications: Secondary | ICD-10-CM | POA: Diagnosis not present

## 2016-08-27 DIAGNOSIS — Z87891 Personal history of nicotine dependence: Secondary | ICD-10-CM | POA: Diagnosis not present

## 2016-08-27 LAB — POCT GLYCOSYLATED HEMOGLOBIN (HGB A1C): Hemoglobin A1C: 9.2

## 2016-08-27 MED ORDER — SIMVASTATIN 40 MG PO TABS: 40.0000 mg | ORAL_TABLET | Freq: Every day | ORAL | 3 refills | Status: DC

## 2016-08-27 MED ORDER — GLIPIZIDE ER 10 MG PO TB24: 10.0000 mg | ORAL_TABLET | Freq: Every day | ORAL | 2 refills | Status: DC

## 2016-08-27 NOTE — Patient Instructions (Signed)
Keep working on diabetes- we are heading in the right direction. Good work.  Follow up in 3 months to see your progress  And get back to the gym- know you can do it!

## 2016-08-27 NOTE — Progress Notes (Signed)
Pre visit review using our clinic review tool, if applicable. No additional management support is needed unless otherwise documented below in the visit note. 

## 2016-08-27 NOTE — Progress Notes (Signed)
Subjective:    Patient ID: Calvin Butler, male    DOB: 09/17/1975, 41 y.o.   MRN: [id number]  CC: Calvin Butler is a 41 y.o. male who presents today for follow up.   HPI: DM-  Increased Invokana to 300 mg at last visit. watching what eating. Not taking blood sugars at home.He missed 2 weeks of his medications as he had run out. Otherwise compliant with medications    Smoking cessation- started Bupropion XL 150 mg daily at last visit, has stopped taking. Plans to resume Wellbutrin when he has a quit date.      HISTORY:  Past Medical History:  Diagnosis Date  . Asthma   . Diabetes mellitus without complication (HCC)   . History of GI bleed    secondary to NSAIDs   Past Surgical History:  Procedure Laterality Date  . FACIAL FRACTURE SURGERY  at 41 yrs old   Right side face - due to accident  . FACIAL FRACTURE SURGERY    . PERCUTANEOUS CORONARY STENT INTERVENTION (PCI-S)  2015   Family History  Problem Relation Age of Onset  . Aneurysm Mother        Brain  . Diabetes Father   . Asthma Sister   . Diabetes Maternal Grandmother   . Arthritis Daughter   . Arthritis Daughter   . Prostate cancer Neg Hx     Allergies: Patient has no known allergies. Current Outpatient Prescriptions on File Prior to Visit  Medication Sig Dispense Refill  . albuterol (PROVENTIL HFA;VENTOLIN HFA) 108 (90 Base) MCG/ACT inhaler Inhale 2 puffs into the lungs every 6 (six) hours as needed for wheezing or shortness of breath. 1 Inhaler 2  . buPROPion (WELLBUTRIN XL) 150 MG 24 hr tablet Take 1 tablet (150 mg total) by mouth daily. 30 tablet 1  . canagliflozin (INVOKANA) 300 MG TABS tablet Take 1 tablet (300 mg total) by mouth daily before breakfast. 90 tablet 1  . dicyclomine (BENTYL) 20 MG tablet Take 1 tablet (20 mg total) by mouth 3 (three) times daily as needed (abdominal pain). (Patient not taking: Reported on 07/07/2016) 30 tablet 0  . glucose blood (ONE TOUCH ULTRA TEST) test strip Use as  instructed 100 each 2  . ibuprofen (ADVIL,MOTRIN) 200 MG tablet Take 400-800 mg by mouth every 6 (six) hours as needed.    . Lancets (ONETOUCH ULTRASOFT) lancets Use as instructed 100 each 2  . metFORMIN (GLUCOPHAGE) 500 MG tablet Take 2 tablets (1,000 mg total) by mouth 2 (two) times daily with a meal. 360 tablet 0  . sitaGLIPtin (JANUVIA) 100 MG tablet Take 1 tablet (100 mg total) by mouth daily. 30 tablet 3   No current facility-administered medications on file prior to visit.     Social History  Substance Use Topics  . Smoking status: Current Every Day Smoker    Packs/day: 1.00    Years: 27.00    Types: Cigarettes  . Smokeless tobacco: Never Used  . Alcohol use 0.0 oz/week     Comment: Occasional    Review of Systems  Constitutional: Negative for chills and fever.  Respiratory: Negative for cough.   Cardiovascular: Negative for chest pain and palpitations.  Gastrointestinal: Negative for nausea and vomiting.      Objective:    BP 118/74   Pulse 80   Temp 97.7 F (36.5 C) (Oral)   Ht 5\' 6"  (1.676 m)   Wt 222 lb (100.7 kg)   SpO2 96%  BMI 35.83 kg/m  BP Readings from Last 3 Encounters:  08/27/16 118/74  07/07/16 104/73  06/01/16 139/78   Wt Readings from Last 3 Encounters:  08/27/16 222 lb (100.7 kg)  07/07/16 220 lb (99.8 kg)  06/01/16 220 lb (99.8 kg)    Physical Exam  Constitutional: He appears well-developed and well-nourished.  Cardiovascular: Regular rhythm and normal heart sounds.   Pulmonary/Chest: Effort normal and breath sounds normal. No respiratory distress. He has no wheezes. He has no rhonchi. He has no rales.  Neurological: He is alert.  Skin: Skin is warm and dry.  Psychiatric: He has a normal mood and affect. His speech is normal and behavior is normal.  Vitals reviewed.      Assessment & Plan:   Problem List Items Addressed This Visit      Endocrine   Diabetes (HCC) - Primary (Chronic)    a1c decreased to 9 from 10 last visit.  Patient missed a couple of weeks of his medications at this time. We jointly agreed as he is heading in the right direction, we will continue therapy. We did make one small change to glipizide extended release. Follow-up in 3 months      Relevant Medications   simvastatin (ZOCOR) 40 MG tablet   glipiZIDE (GLIPIZIDE XL) 10 MG 24 hr tablet   Other Relevant Orders   POCT HgB A1C (Completed)     Other   Hyperlipidemia   Relevant Medications   simvastatin (ZOCOR) 40 MG tablet   History of smoking    Encouraged cessation. Patient will restart Wellbutrin when he has a quit date.          I have discontinued Mr. Diltz glipiZIDE. I am also having him start on glipiZIDE. Additionally, I am having him maintain his ibuprofen, sitaGLIPtin, albuterol, dicyclomine, canagliflozin, buPROPion, metFORMIN, onetouch ultrasoft, glucose blood, and simvastatin.   Meds ordered this encounter  Medications  . simvastatin (ZOCOR) 40 MG tablet    Sig: Take 1 tablet (40 mg total) by mouth daily.    Dispense:  90 tablet    Refill:  3  . glipiZIDE (GLIPIZIDE XL) 10 MG 24 hr tablet    Sig: Take 1 tablet (10 mg total) by mouth daily with breakfast.    Dispense:  90 tablet    Refill:  2    Order Specific Question:   Supervising Provider    Answer:   Sherlene Shams [[id number]]    Return precautions given.   Risks, benefits, and alternatives of the medications and treatment plan prescribed today were discussed, and patient expressed understanding.   Education regarding symptom management and diagnosis given to patient on AVS.  Continue to follow with Allegra Grana, FNP for routine health maintenance.   Calvin Butler and I agreed with plan.   Rennie Plowman, FNP

## 2016-08-27 NOTE — Assessment & Plan Note (Addendum)
a1c decreased to 9 from 10 last visit. Patient missed a couple of weeks of his medications at this time. We jointly agreed as he is heading in the right direction, we will continue therapy. We did make one small change to glipizide extended release. Follow-up in 3 months

## 2016-08-27 NOTE — Assessment & Plan Note (Signed)
Encouraged cessation. Patient will restart Wellbutrin when he has a quit date.

## 2016-09-04 ENCOUNTER — Other Ambulatory Visit: Payer: Self-pay | Admitting: Family

## 2016-09-30 ENCOUNTER — Other Ambulatory Visit: Payer: Self-pay | Admitting: Family

## 2016-10-05 ENCOUNTER — Other Ambulatory Visit: Payer: Self-pay | Admitting: Family

## 2016-10-05 DIAGNOSIS — E119 Type 2 diabetes mellitus without complications: Secondary | ICD-10-CM

## 2016-11-06 ENCOUNTER — Other Ambulatory Visit: Payer: Self-pay | Admitting: Family

## 2017-01-06 ENCOUNTER — Other Ambulatory Visit: Payer: Self-pay | Admitting: Family

## 2017-01-10 ENCOUNTER — Other Ambulatory Visit: Payer: Self-pay | Admitting: Family

## 2017-01-10 DIAGNOSIS — E119 Type 2 diabetes mellitus without complications: Secondary | ICD-10-CM

## 2017-03-30 ENCOUNTER — Other Ambulatory Visit: Payer: Self-pay | Admitting: Family

## 2017-03-30 DIAGNOSIS — E119 Type 2 diabetes mellitus without complications: Secondary | ICD-10-CM

## 2017-04-15 IMAGING — CR DG FOOT COMPLETE 3+V*R*
1 series · 3 of 3 positions shown · non-contrast
Comparison: 04/07/2014; report from 05/18/2007

CLINICAL DATA: Right foot pain for 3 months.

EXAM:
RIGHT FOOT COMPLETE - 3+ VIEW

[Series 1: ap · 0.17mm/px · 3 of 3 slices shown]
[im 1/3]
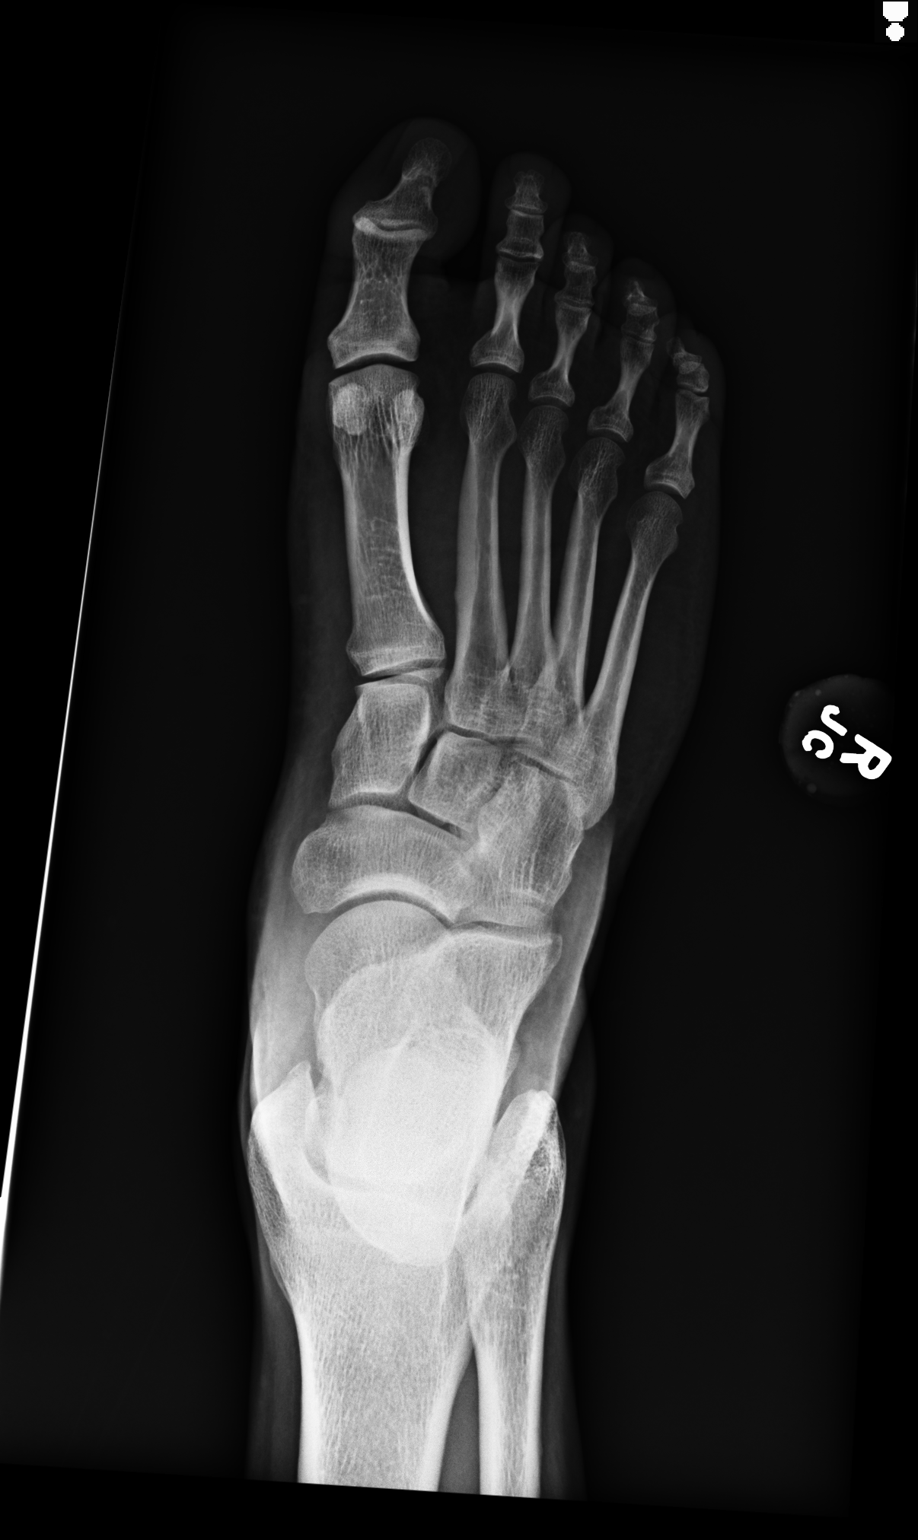
[im 2/3]
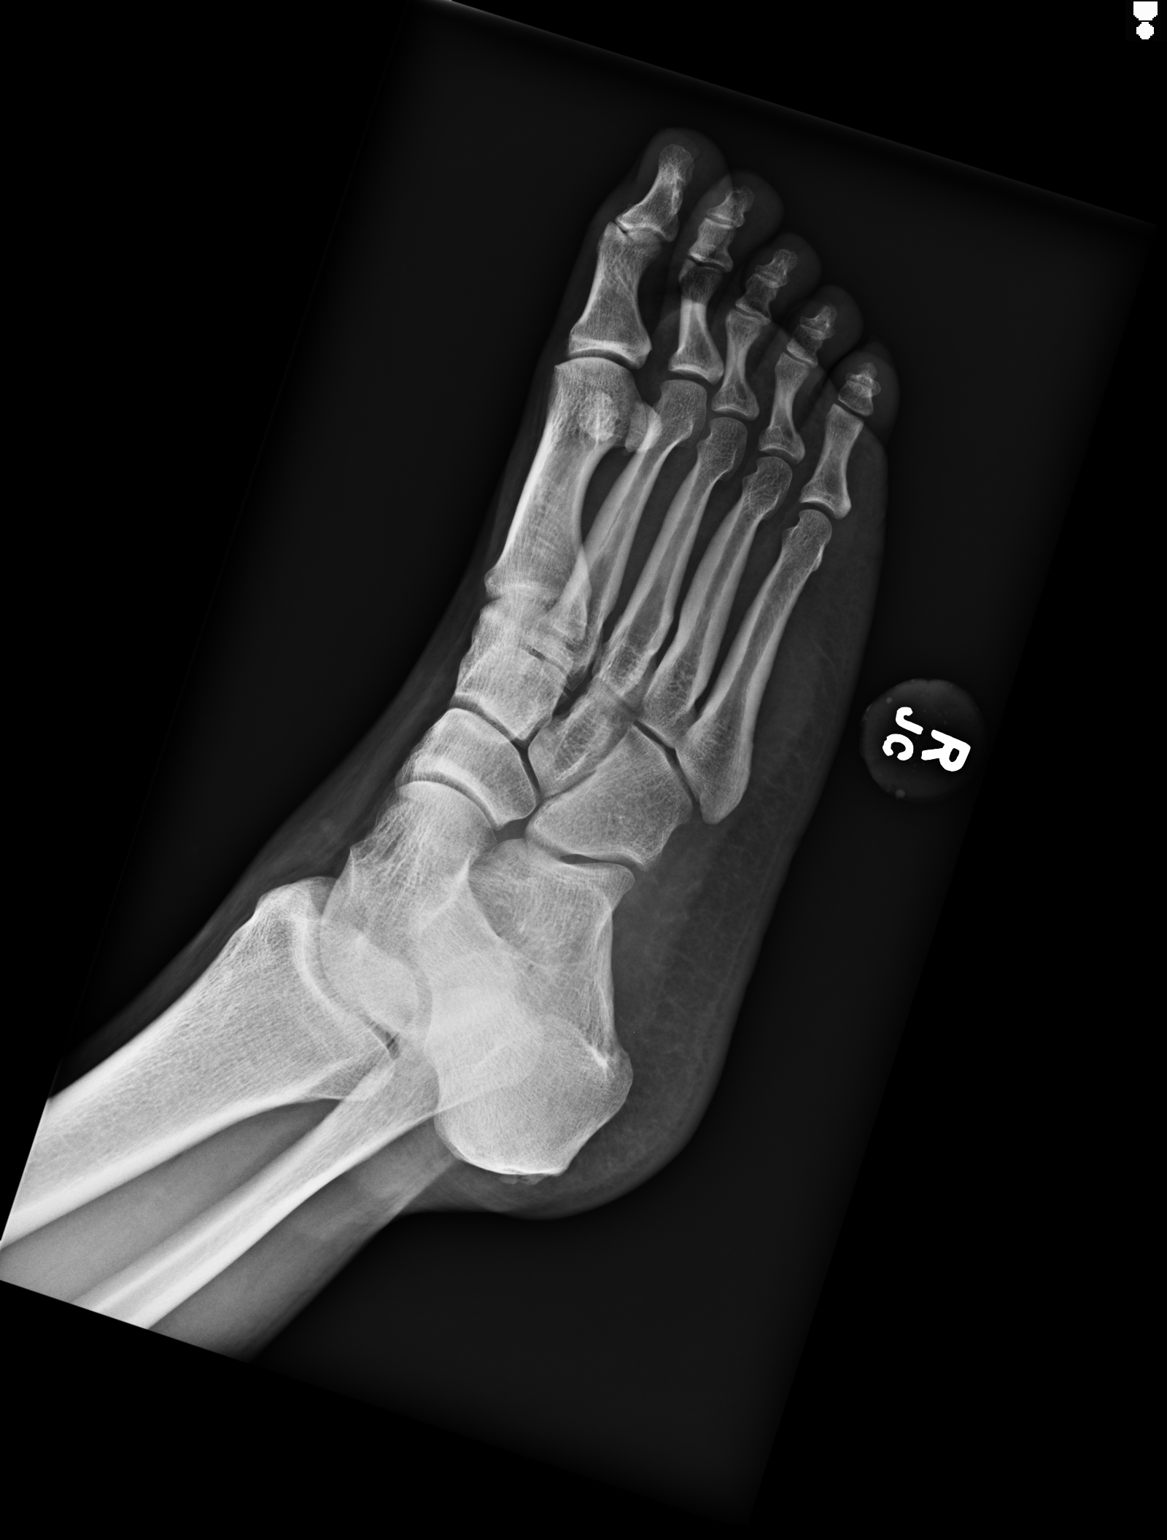
[im 3/3]
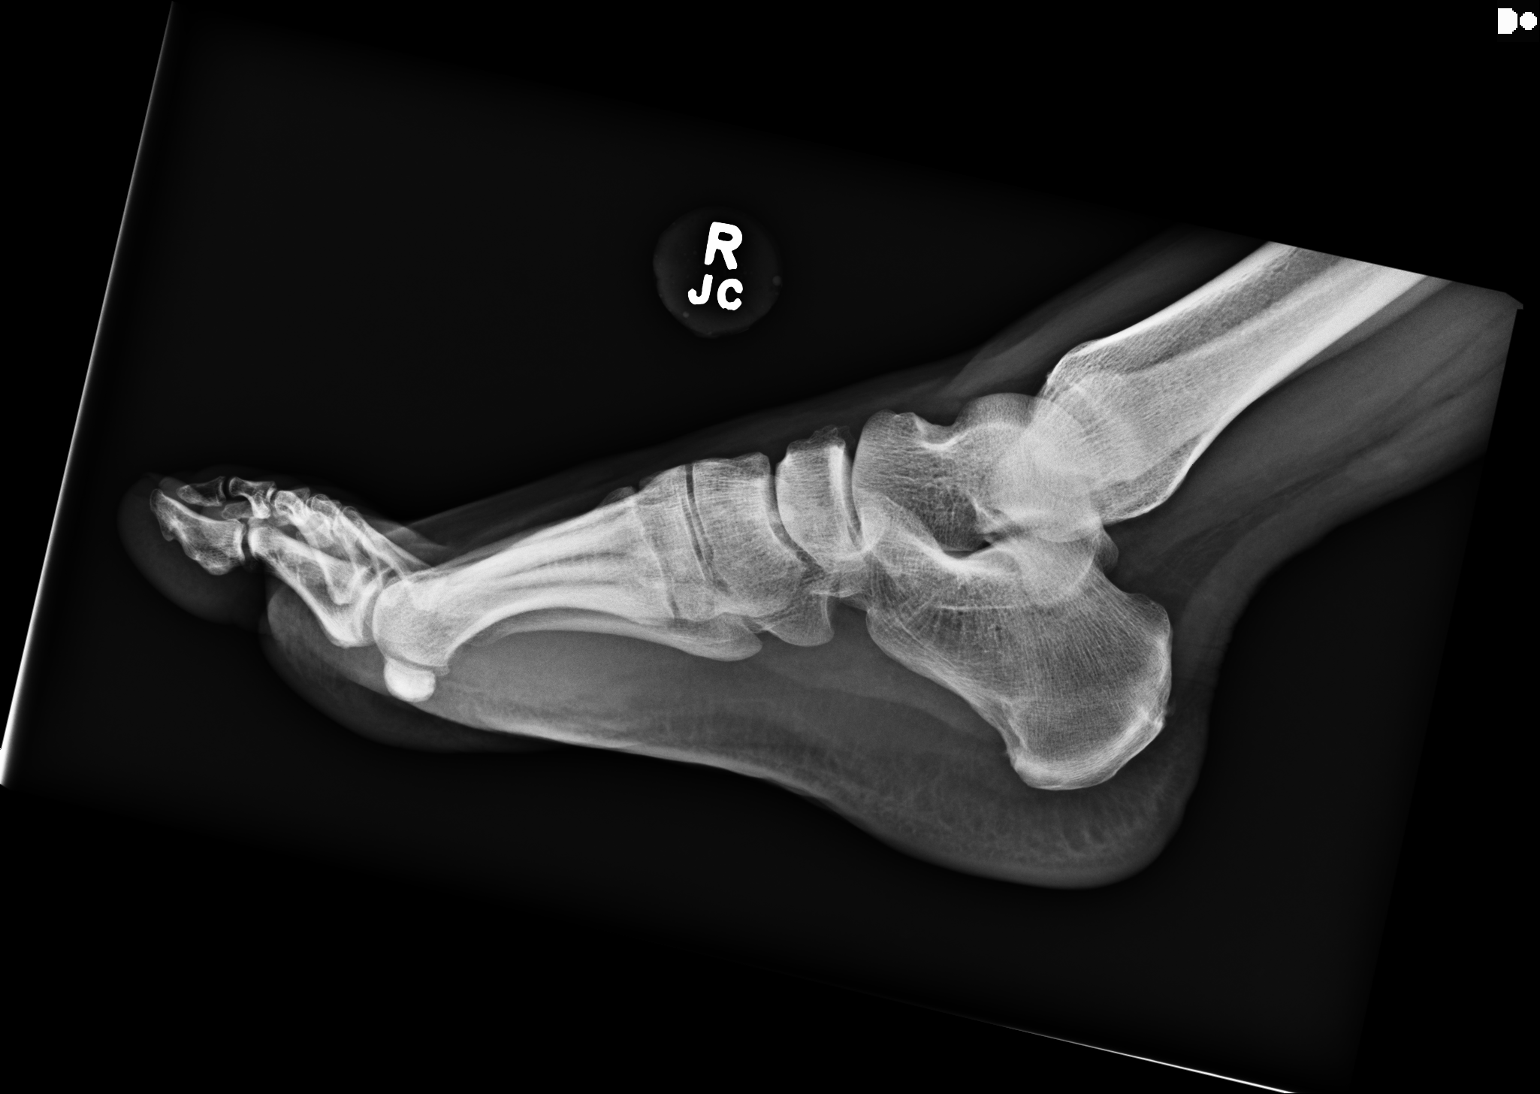

[3 of 3 positions shown; findings below may reference images not displayed]

FINDINGS: Lisfranc joint alignment normal. No fracture or acute bony findings.
Small plantar and Achilles calcaneal spurs. Mild dorsal midfoot
spurring.
IMPRESSION: 1. No fracture or malalignment identified.
2. Minimal calcaneal and dorsal midfoot spurring.
3. If pain persists despite conservative therapy, MRI may be
warranted for further characterization.

## 2017-05-19 ENCOUNTER — Other Ambulatory Visit: Payer: Self-pay | Admitting: Family

## 2017-05-19 DIAGNOSIS — E119 Type 2 diabetes mellitus without complications: Secondary | ICD-10-CM

## 2017-10-14 IMAGING — CT CT ABD-PELV W/ CM
2 of 5 series · 17 of 46 positions shown, 19 images · IV contrast (omnipaque)
Comparison: 08/28/2008

CLINICAL DATA: Right abdominal pain, epigastric pain for 1 week.
Diarrhea, nausea.

EXAM:
CT ABDOMEN AND PELVIS WITH CONTRAST
TECHNIQUE: Multidetector CT imaging of the abdomen and pelvis was performed
using the standard protocol following bolus administration of
intravenous contrast.
CONTRAST:  100mL OMNIPAQUE IOHEXOL 300 MG/ML  SOLN

[Series 2: routine abd pel with · axial · 0.93mm/px · z∈[-482,-57]mm · 14 of 96 slices shown, 16 images]
[im 6/96  soft-tissue]
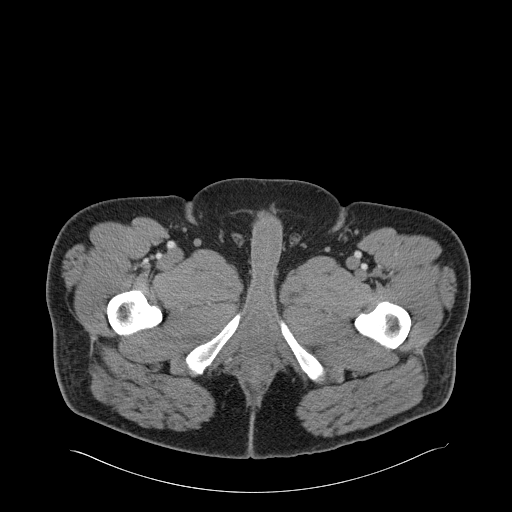
[im 6/96  bone]
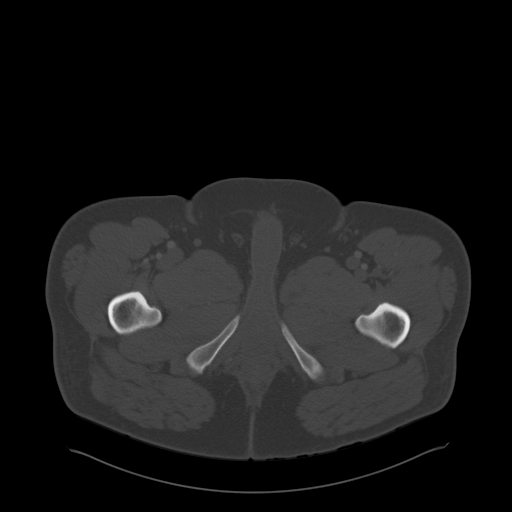
[im 11/96  soft-tissue]
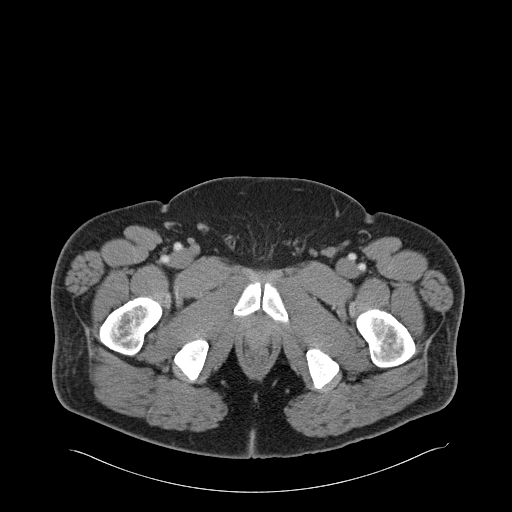
[im 21/96  soft-tissue]
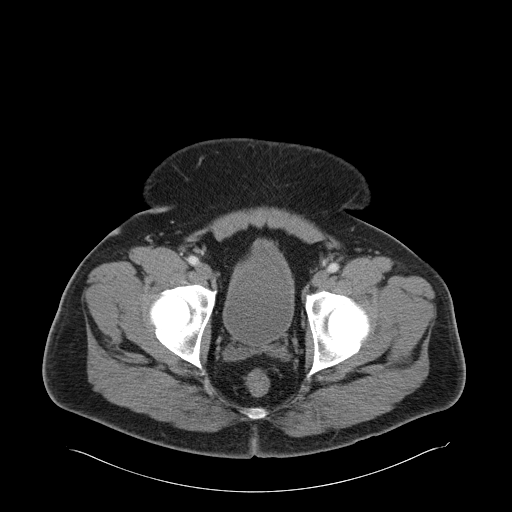
[im 26/96  soft-tissue]
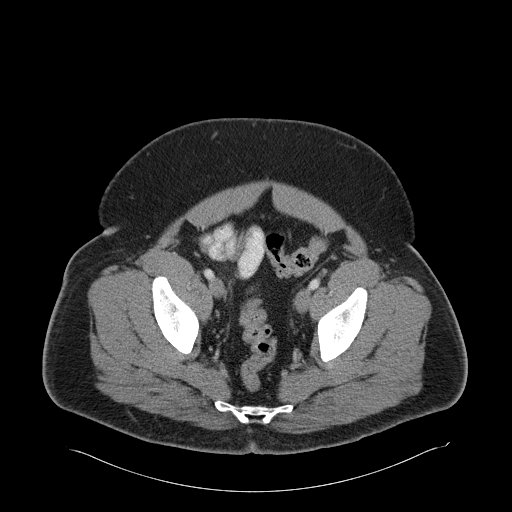
[im 31/96  soft-tissue]
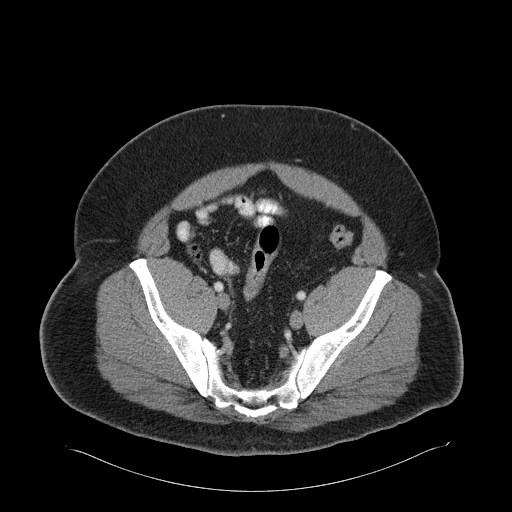
[im 41/96  soft-tissue]
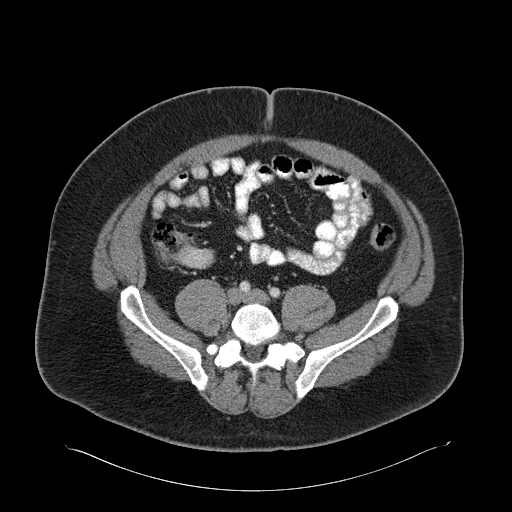
[im 46/96  soft-tissue]
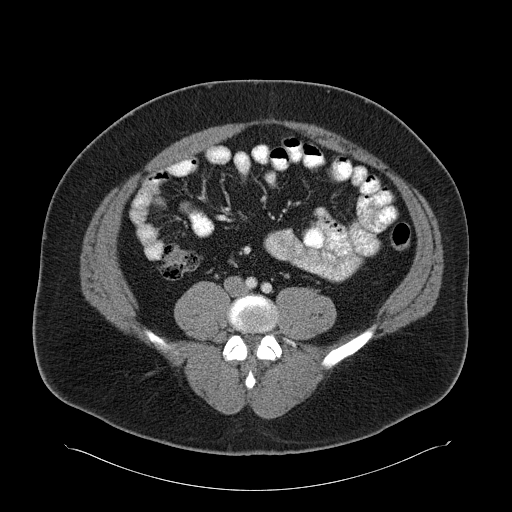
[im 51/96  soft-tissue]
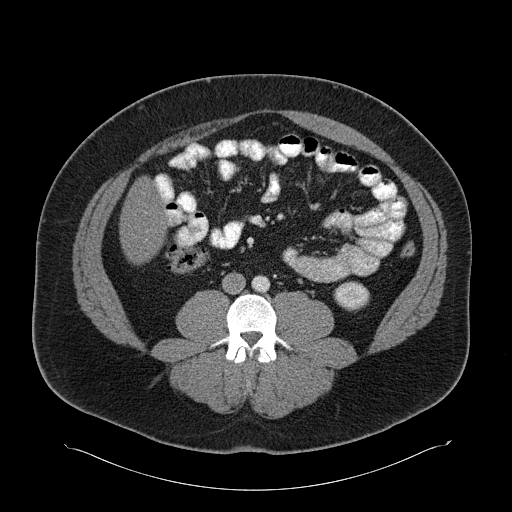
[im 56/96  soft-tissue]
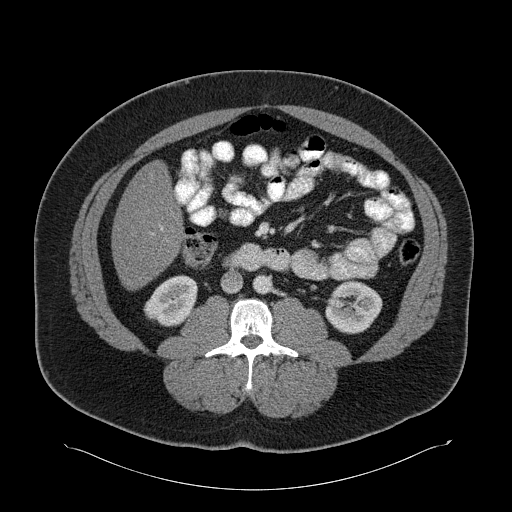
[im 56/96  bone]
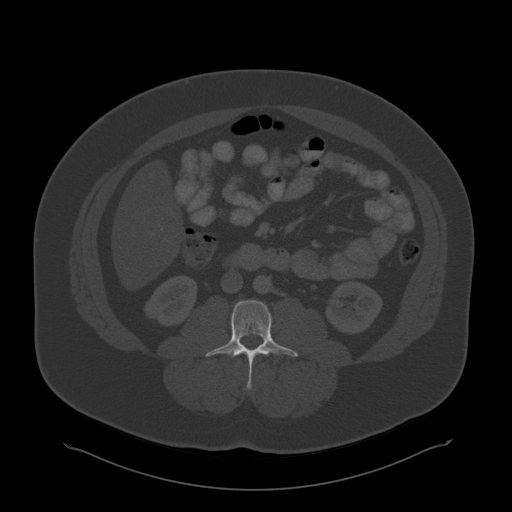
[im 66/96  soft-tissue]
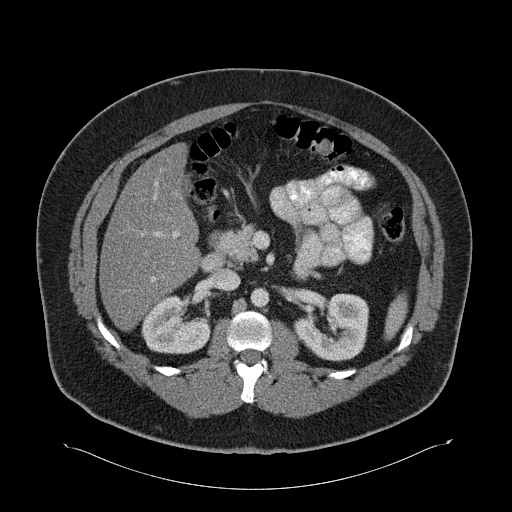
[im 71/96  soft-tissue]
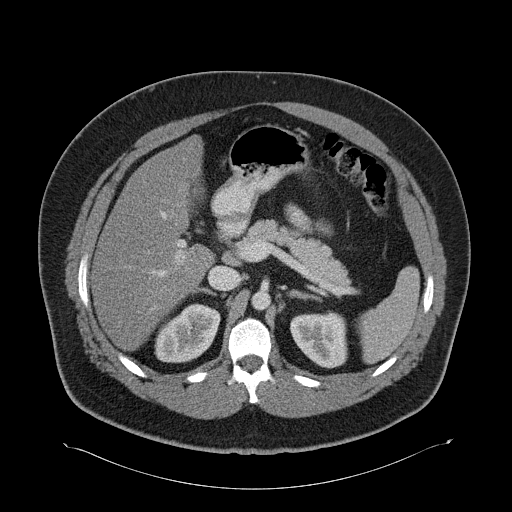
[im 76/96  soft-tissue]
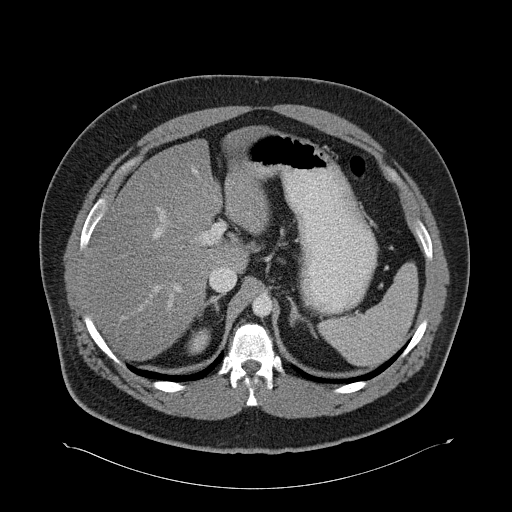
[im 86/96  soft-tissue]
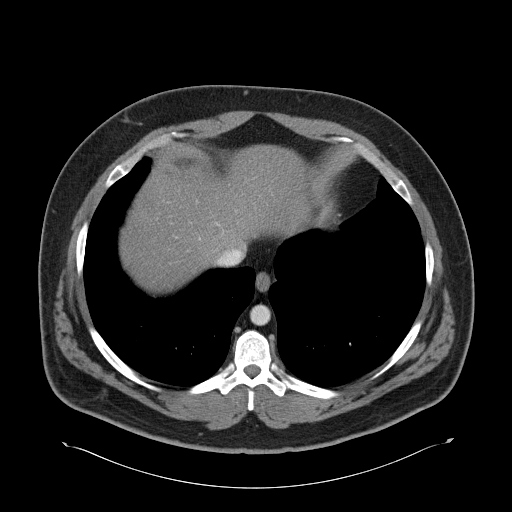
[im 91/96  soft-tissue]
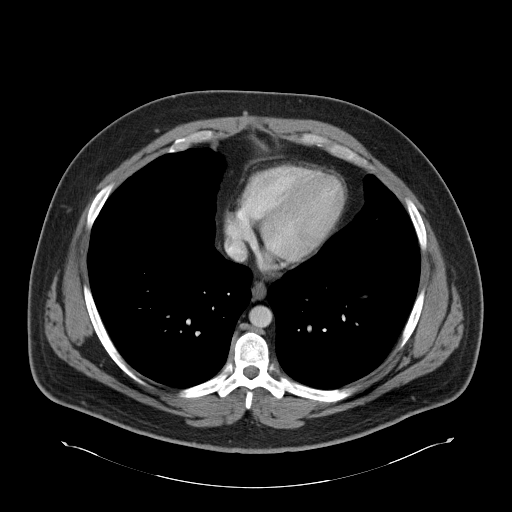

[Series 5: cor routine abd pel with · coronal · 0.78mm/px · 3 of 173 slices shown]
[im 58/173  soft-tissue]
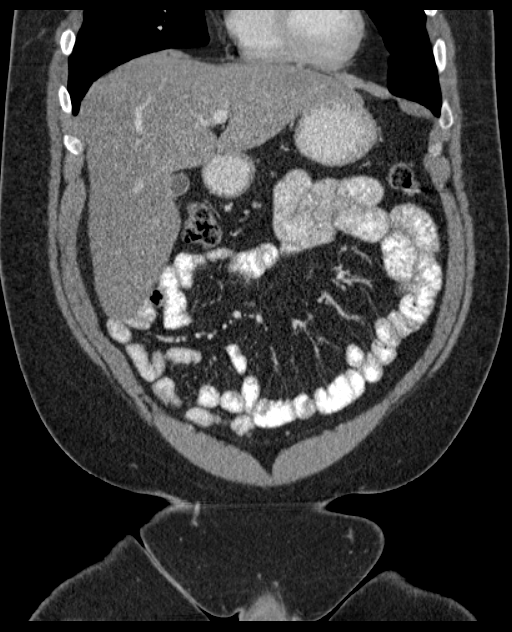
[im 77/173  soft-tissue]
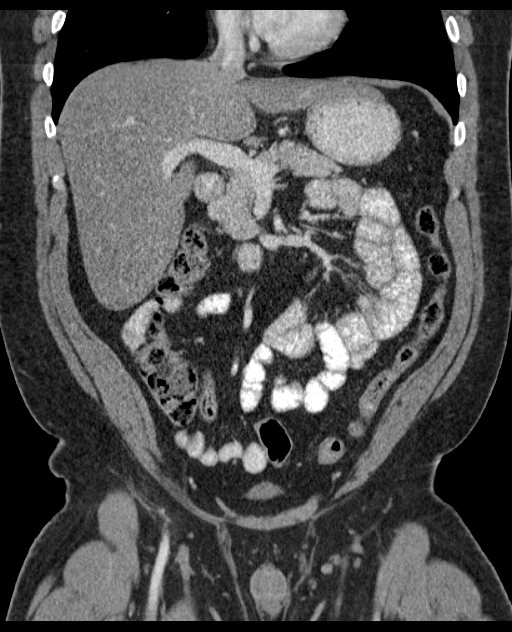
[im 96/173  soft-tissue]
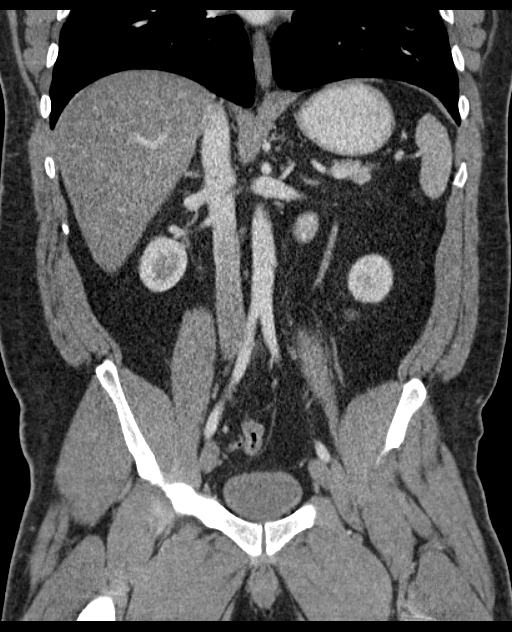

[17 of 46 positions shown; findings below may reference images not displayed]

FINDINGS: Lung bases are clear.  No effusions.  Heart is normal size.

Diffuse fatty infiltration of the liver without focal abnormality.
Gallbladder, spleen, pancreas, adrenals and kidneys are normal.

Appendix is normal. Stomach, large and small bowel are unremarkable.
No free fluid, free air or adenopathy. Aorta is normal caliber.
Urinary bladder grossly unremarkable no acute bony abnormality or
focal bone lesion.
IMPRESSION: Diffuse fatty infiltration at diffuse fatty infiltration of the
liver.

No acute findings.

## 2018-04-08 ENCOUNTER — Ambulatory Visit: Payer: Self-pay | Admitting: Family

## 2018-04-08 NOTE — Telephone Encounter (Signed)
  Pt called in c/o blood sugars running high.   He lost his job so has no insurance and can't afford to come in for a visit.   He is out of his medications.   He is wanting to know if Rennie Plowman, FNP could give him an Rx for the metformin even though he has not seen her in over a year.   He also wanted to know what it cost for an office visit.    I called the flow coordinator, Lupita Leash and made her aware of his situation.   They agreed to see him even though he does not have insurance or money to pay for an office visit. I scheduled him with Rennie Plowman, FNP for 04/09/2018 at 1:30.  He said thank you so much.   I really appreciate y'all seeing me.    Reason for Disposition . [1] Symptoms of high blood sugar (e.g., frequent urination, weak, weight loss) AND [2] not able to test blood glucose  Answer Assessment - Initial Assessment Questions 1. BLOOD GLUCOSE: "What is your blood glucose level?"      390 last night before bed.  This morning 280.   Last week it was 480.  I'm eating peanuts and drinking water.   This is the only thing that is keeping my sugar down.      I'm supposed to be on metformin,etc.   I lost my insurance.   So unable to fill Rx.      2. ONSET: "When did you check the blood glucose?"     See above 3. USUAL RANGE: "What is your glucose level usually?" (e.g., usual fasting morning value, usual evening value)     On medicines it's in the 120's. 4. KETONES: "Do you check for ketones (urine or blood test strips)?" If yes, ask: "What does the test show now?"      N/A 5. TYPE 1 or 2:  "Do you know what type of diabetes you have?"  (e.g., Type 1, Type 2, Gestational; doesn't know)      Type 2  6. INSULIN: "Do you take insulin?" "What type of insulin(s) do you use? What is the mode of delivery? (syringe, pen (e.g., injection or  pump)?"      Never on insulin 7. DIABETES PILLS: "Do you take any pills for your diabetes?" If yes, ask: "Have you missed taking any pills recently?"    Metformin plus 2 others 8. OTHER SYMPTOMS: "Do you have any symptoms?" (e.g., fever, frequent urination, difficulty breathing, dizziness, weakness, vomiting)     Headache and sleepy also anger.   Urinating a lot.    9. PREGNANCY: "Is there any chance you are pregnant?" "When was your last menstrual period?"     N/A  Protocols used: DIABETES - HIGH BLOOD SUGAR-A-AH

## 2018-04-09 ENCOUNTER — Ambulatory Visit (INDEPENDENT_AMBULATORY_CARE_PROVIDER_SITE_OTHER): Payer: Self-pay | Admitting: Family

## 2018-04-09 ENCOUNTER — Encounter: Payer: Self-pay | Admitting: Family

## 2018-04-09 ENCOUNTER — Other Ambulatory Visit: Payer: Self-pay

## 2018-04-09 VITALS — BP 92/58 | HR 75 | Temp 97.8°F | Wt 207.0 lb

## 2018-04-09 DIAGNOSIS — E119 Type 2 diabetes mellitus without complications: Secondary | ICD-10-CM

## 2018-04-09 DIAGNOSIS — E785 Hyperlipidemia, unspecified: Secondary | ICD-10-CM

## 2018-04-09 DIAGNOSIS — J45909 Unspecified asthma, uncomplicated: Secondary | ICD-10-CM

## 2018-04-09 LAB — BASIC METABOLIC PANEL
BUN: 20 mg/dL (ref 6–23)
CO2: 25 mEq/L (ref 19–32)
Calcium: 9.3 mg/dL (ref 8.4–10.5)
Chloride: 105 mEq/L (ref 96–112)
Creatinine, Ser: 0.83 mg/dL (ref 0.40–1.50)
GFR: 100.9 mL/min (ref >60.00)
Glucose, Bld: 216 mg/dL — ABNORMAL HIGH (ref 70–99)
Potassium: 4.3 mEq/L (ref 3.5–5.1)
Sodium: 137 mEq/L (ref 135–145)

## 2018-04-09 LAB — POCT GLYCOSYLATED HEMOGLOBIN (HGB A1C): Hemoglobin A1C: 9.5 % — AB (ref 4.0–5.6)

## 2018-04-09 MED ORDER — SIMVASTATIN 40 MG PO TABS: 40.0000 mg | ORAL_TABLET | Freq: Every day | ORAL | 3 refills | Status: DC

## 2018-04-09 MED ORDER — METFORMIN HCL 500 MG PO TABS: 500.0000 mg | ORAL_TABLET | Freq: Two times a day (BID) | ORAL | 3 refills | Status: DC

## 2018-04-09 MED ORDER — ALBUTEROL SULFATE HFA 108 (90 BASE) MCG/ACT IN AERS: 2.0000 | INHALATION_SPRAY | Freq: Four times a day (QID) | RESPIRATORY_TRACT | 2 refills | Status: DC | PRN

## 2018-04-09 NOTE — Progress Notes (Signed)
Subjective:    Patient ID: Calvin Butler, male    DOB: [birth year], 43 y.o.   MRN: [id number]  CC: Calvin Butler is a 43 y.o. male who presents today for follow up.   HPI: Had prior PCP in [place] , [place]  Now has moved back here.  Currently unemployed  Overall feels well.  No complaints today  DM- had been on only metformin prior to lost insurance. Had taken some glipizide. FBG 178. On glipizide, FBG 110. In January had FBG in 400's however with diet, this come down.  Denies polyuria, polydipsia, polyphagia.   Some changes in clarity vision however not acute.  No sudden vision loss NO eye exam in last year.   Wears glasses  Has been focused on diet with improved  HLD-has not been on Zocor   HISTORY:  Past Medical History:  Diagnosis Date  . Asthma   . Diabetes mellitus without complication (HCC)   . History of GI bleed    secondary to NSAIDs   Past Surgical History:  Procedure Laterality Date  . FACIAL FRACTURE SURGERY  at 43 yrs old   Right side face - due to accident  . FACIAL FRACTURE SURGERY    . PERCUTANEOUS CORONARY STENT INTERVENTION (PCI-S)  2015   Family History  Problem Relation Age of Onset  . Aneurysm Mother        Brain  . Diabetes Father   . Asthma Sister   . Diabetes Maternal Grandmother   . Arthritis Daughter   . Arthritis Daughter   . Prostate cancer Neg Hx     Allergies: Patient has no known allergies. Current Outpatient Medications on File Prior to Visit  Medication Sig Dispense Refill  . glucose blood (ONE TOUCH ULTRA TEST) test strip Use as instructed 100 each 2  . ibuprofen (ADVIL,MOTRIN) 200 MG tablet Take 400-800 mg by mouth every 6 (six) hours as needed.    . Lancets (ONETOUCH ULTRASOFT) lancets Use as instructed 100 each 2   No current facility-administered medications on file prior to visit.     Social History   Tobacco Use  . Smoking status: Current Every Day Smoker    Packs/day: 1.00    Years: 27.00    Pack years:  27.00    Types: Cigarettes  . Smokeless tobacco: Never Used  Substance Use Topics  . Alcohol use: Yes    Alcohol/week: 0.0 standard drinks    Comment: Occasional  . Drug use: No    Review of Systems  Constitutional: Negative for chills and fever.  Eyes: Positive for visual disturbance.  Respiratory: Negative for cough.   Cardiovascular: Negative for chest pain and palpitations.  Gastrointestinal: Negative for nausea and vomiting.  Endocrine: Negative for polydipsia, polyphagia and polyuria.      Objective:    BP (!) 92/58 (BP Location: Left Arm, Patient Position: Sitting, Cuff Size: Large)   Pulse 75   Temp 97.8 F (36.6 C)   Wt 207 lb (93.9 kg)   SpO2 97%   BMI 33.41 kg/m  BP Readings from Last 3 Encounters:  04/09/18 (!) 92/58  08/27/16 118/74  07/07/16 104/73   Wt Readings from Last 3 Encounters:  04/09/18 207 lb (93.9 kg)  08/27/16 222 lb (100.7 kg)  07/07/16 220 lb (99.8 kg)    Physical Exam Vitals signs reviewed.  Constitutional:      Appearance: He is well-developed.  Cardiovascular:     Rate and Rhythm: Regular rhythm.  Heart sounds: Normal heart sounds.  Pulmonary:     Effort: Pulmonary effort is normal. No respiratory distress.     Breath sounds: Normal breath sounds. No wheezing, rhonchi or rales.  Skin:    General: Skin is warm and dry.  Neurological:     Mental Status: He is alert.  Psychiatric:        Speech: Speech normal.        Behavior: Behavior normal.        Assessment & Plan:   Problem List Items Addressed This Visit      Endocrine   Diabetes (HCC) - Primary (Chronic)    Presume uncontrolled.  Pending A1c, BMP.  Will resume metformin.  Patientwill  focus on dietary measures.  Close follow-up.   Advised him to get eye exam however this is limited by financial situation ( unemployment) at this time.  We will continue to monitor.  No acute vision changes at this time.      Relevant Medications   metFORMIN (GLUCOPHAGE) 500 MG  tablet   simvastatin (ZOCOR) 40 MG tablet   Other Relevant Orders   Basic metabolic panel     Other   Hyperlipidemia    Resume Zocor      Relevant Medications   simvastatin (ZOCOR) 40 MG tablet       I have discontinued Margurite Auerbach D. Bernardy's dicyclomine, canagliflozin, buPROPion, glipiZIDE, glipiZIDE, Invokana, and Januvia. I have also changed his metFORMIN. Additionally, I am having him maintain his ibuprofen, onetouch ultrasoft, glucose blood, and simvastatin.   Meds ordered this encounter  Medications  . metFORMIN (GLUCOPHAGE) 500 MG tablet    Sig: Take 1 tablet (500 mg total) by mouth 2 (two) times daily with a meal.    Dispense:  180 tablet    Refill:  3    Order Specific Question:   Supervising Provider    Answer:   Duncan Dull L [[id number]]  . simvastatin (ZOCOR) 40 MG tablet    Sig: Take 1 tablet (40 mg total) by mouth daily.    Dispense:  90 tablet    Refill:  3    Order Specific Question:   Supervising Provider    Answer:   Sherlene Shams [[id number]]    Return precautions given.   Risks, benefits, and alternatives of the medications and treatment plan prescribed today were discussed, and patient expressed understanding.   Education regarding symptom management and diagnosis given to patient on AVS.  Continue to follow with Allegra Grana, FNP for routine health maintenance.   Rosanna Randy and I agreed with plan.   Rennie Plowman, FNP

## 2018-04-09 NOTE — Patient Instructions (Signed)
Please call medication management clinic as we discussed today.  You may find your medications are cheaper there. You may also try good Rx. Please resume metformin and also Zocor. Stay safe!

## 2018-04-09 NOTE — Assessment & Plan Note (Signed)
Resume Zocor 

## 2018-04-09 NOTE — Addendum Note (Signed)
Addended by: Davis Gourd on: 04/09/2018 03:21 PM   Modules accepted: Orders

## 2018-04-09 NOTE — Assessment & Plan Note (Signed)
Presume uncontrolled.  Pending A1c, BMP.  Will resume metformin.  Patientwill  focus on dietary measures.  Close follow-up.   Advised him to get eye exam however this is limited by financial situation ( unemployment) at this time.  We will continue to monitor.  No acute vision changes at this time.

## 2018-04-14 ENCOUNTER — Other Ambulatory Visit: Payer: Self-pay | Admitting: Family

## 2018-04-14 DIAGNOSIS — E119 Type 2 diabetes mellitus without complications: Secondary | ICD-10-CM

## 2018-04-14 MED ORDER — METFORMIN HCL 1000 MG PO TABS: 1000.0000 mg | ORAL_TABLET | Freq: Two times a day (BID) | ORAL | 1 refills | Status: DC

## 2018-04-15 ENCOUNTER — Telehealth: Payer: Self-pay

## 2018-04-15 NOTE — Telephone Encounter (Signed)
I called patient back. See result note.

## 2018-04-15 NOTE — Telephone Encounter (Signed)
Copied from CRM [id number]. Topic: General - Other >> Apr 15, 2018  8:04 AM Fanny Bien wrote: Reason for CRM: pt called and stated that he missed a call from the office and would like a call regarding. Pt did not know why we would of called. Please advise

## 2018-04-15 NOTE — Telephone Encounter (Signed)
Spoke with pt and informed him of how to take his new dose of metformin. Pt gave a verbal understanding.

## 2018-06-04 NOTE — Telephone Encounter (Signed)
error 

## 2018-07-12 ENCOUNTER — Ambulatory Visit: Payer: Self-pay | Admitting: Family

## 2018-07-12 ENCOUNTER — Telehealth: Payer: Self-pay

## 2018-07-12 NOTE — Telephone Encounter (Signed)
Copied from [place] [id number]. Topic: General - Other >> Jul 12, 2018  9:06 AM Rainey Pines A wrote: Patient would like to speak nurse about his medications glipiZIDE (GLUCOTROL) 10 MG tablet and metFORMIN (GLUCOPHAGE) 1000 MG tablet.

## 2018-07-13 NOTE — Telephone Encounter (Signed)
Please offer doxy   Thank you !

## 2018-07-13 NOTE — Telephone Encounter (Signed)
LM asking patient to returWn call to see about getting him scheduled for a doxy. I did inform that first available would be 7/6 before I could get him in.

## 2018-07-19 NOTE — Telephone Encounter (Signed)
Circle back - call pt and offer appt this week

## 2018-07-19 NOTE — Telephone Encounter (Signed)
LMTCB to schedule virtual visit 

## 2018-07-30 ENCOUNTER — Encounter: Payer: Self-pay | Admitting: Family

## 2018-07-30 ENCOUNTER — Ambulatory Visit (INDEPENDENT_AMBULATORY_CARE_PROVIDER_SITE_OTHER): Payer: Self-pay | Admitting: Family

## 2018-07-30 DIAGNOSIS — E119 Type 2 diabetes mellitus without complications: Secondary | ICD-10-CM

## 2018-07-30 DIAGNOSIS — E785 Hyperlipidemia, unspecified: Secondary | ICD-10-CM

## 2018-07-30 MED ORDER — GLIPIZIDE 5 MG PO TABS: 5.0000 mg | ORAL_TABLET | Freq: Two times a day (BID) | ORAL | 3 refills | Status: DC

## 2018-07-30 NOTE — Patient Instructions (Addendum)
Start glipizide 5mg ; take 30 minute prior to meal. DO not take if skipping a meal.   GOAL of Fasting blood sugars to approach 120 in the morning.   Stay on metformin.   Cal to schedule a1c in august .  Schedule eye exam.    Most important - stay safe!

## 2018-07-30 NOTE — Assessment & Plan Note (Signed)
Uncontrolled however improved when started glipizide on his own.  Will start glipizide 5 mg.  Will maintain on his metformin.  Understands to call and schedule his A1c in august as well as eye exam.

## 2018-07-30 NOTE — Assessment & Plan Note (Signed)
He declines doing a lipid panel at this time due to lack of insurance, we will do this in the Fall once he has insurance

## 2018-07-30 NOTE — Progress Notes (Addendum)
This visit type was conducted due to national recommendations for restrictions regarding the COVID-19 pandemic (e.g. social distancing).  This format is felt to be most appropriate for this patient at this time.  All issues noted in this document were discussed and addressed.  No physical exam was performed (except for noted visual exam findings with Video Visits). Virtual Visit via Video Note  I connected with@  on 07/30/18 at 11:30 AM EDT by a video enabled telemedicine application and verified that I am speaking with the correct person using two identifiers.  Location patient: home Location provider:work Persons participating in the virtual visit: patient, provider  I discussed the limitations of evaluation and management by telemedicine and the availability of in person appointments. The patient expressed understanding and agreed to proceed.  Interactive audio and video telecommunications were attempted between this provider and patient, however failed, due to patient having technical difficulties or patient did not have access to video capability.  We continued and completed visit with audio only.   HPI:  Feels well today.  New job starting in a month.   DM-increased metformin at last visit ( a1c 9.5). On 1000mg  BID.  Would like to be back on glipizide. Started glipizide from friend ( unsure of dose) for 2 weeks. FBG are 160-170. ( prior has been 250).  Post prandial 2 hours 130.  Vision continues to be blurry, some days better when sugars lower. NO vision loss, HA, polyuria, polyphagia, polydipsia.    ROS: See pertinent positives and negatives per HPI.  Past Medical History:  Diagnosis Date  . Asthma   . Diabetes mellitus without complication (HCC)   . History of GI bleed    secondary to NSAIDs    Past Surgical History:  Procedure Laterality Date  . FACIAL FRACTURE SURGERY  at 43 yrs old   Right side face - due to accident  . FACIAL FRACTURE SURGERY    . PERCUTANEOUS  CORONARY STENT INTERVENTION (PCI-S)  2015    Family History  Problem Relation Age of Onset  . Aneurysm Mother        Brain  . Diabetes Father   . Asthma Sister   . Heart attack Sister   . Diabetes Maternal Grandmother   . Arthritis Daughter   . Arthritis Daughter   . Prostate cancer Neg Hx     SOCIAL HX: current smoker    Current Outpatient Medications:  .  albuterol (PROVENTIL HFA;VENTOLIN HFA) 108 (90 Base) MCG/ACT inhaler, Inhale 2 puffs into the lungs every 6 (six) hours as needed for wheezing or shortness of breath. (Patient not taking: Reported on 04/09/2018), Disp: 1 Inhaler, Rfl: 2 .  glipiZIDE (GLUCOTROL) 5 MG tablet, Take 1 tablet (5 mg total) by mouth 2 (two) times daily before a meal., Disp: 60 tablet, Rfl: 3 .  glucose blood (ONE TOUCH ULTRA TEST) test strip, Use as instructed, Disp: 100 each, Rfl: 2 .  ibuprofen (ADVIL,MOTRIN) 200 MG tablet, Take 400-800 mg by mouth every 6 (six) hours as needed., Disp: , Rfl:  .  Lancets (ONETOUCH ULTRASOFT) lancets, Use as instructed, Disp: 100 each, Rfl: 2 .  metFORMIN (GLUCOPHAGE) 1000 MG tablet, Take 1 tablet (1,000 mg total) by mouth 2 (two) times daily with a meal., Disp: 90 tablet, Rfl: 1 .  simvastatin (ZOCOR) 40 MG tablet, Take 1 tablet (40 mg total) by mouth daily., Disp: 90 tablet, Rfl: 3   ASSESSMENT AND PLAN:  Discussed the following assessment and plan:  Problem List  Items Addressed This Visit      Endocrine   Diabetes ([place]) - Primary (Chronic)    Uncontrolled however improved when started glipizide on his own.  Will start glipizide 5 mg.  Will maintain on his metformin.  Understands to call and schedule his A1c in august as well as eye exam.       Relevant Medications   glipiZIDE (GLUCOTROL) 5 MG tablet     Other   Hyperlipidemia    He declines doing a lipid panel at this time due to lack of insurance, we will do this in the Fall once he has insurance           I discussed the assessment and treatment  plan with the patient. The patient was provided an opportunity to ask questions and all were answered. The patient agreed with the plan and demonstrated an understanding of the instructions.   The patient was advised to call back or seek an in-person evaluation if the symptoms worsen or if the condition fails to improve as anticipated.   Mable Paris, FNP   I spent 25 min non face to face w/ pt.

## 2018-07-31 ENCOUNTER — Other Ambulatory Visit: Payer: Self-pay | Admitting: Family

## 2018-07-31 DIAGNOSIS — E119 Type 2 diabetes mellitus without complications: Secondary | ICD-10-CM

## 2018-09-02 ENCOUNTER — Emergency Department: Payer: Self-pay

## 2018-09-02 ENCOUNTER — Other Ambulatory Visit: Payer: Self-pay

## 2018-09-02 ENCOUNTER — Emergency Department
Admission: EM | Admit: 2018-09-02 | Discharge: 2018-09-02 | Disposition: A | Discharge status: Home / Self Care | Payer: Self-pay | Attending: Emergency Medicine | Admitting: Emergency Medicine

## 2018-09-02 DIAGNOSIS — K859 Acute pancreatitis without necrosis or infection, unspecified: Secondary | ICD-10-CM | POA: Insufficient documentation

## 2018-09-02 DIAGNOSIS — F17210 Nicotine dependence, cigarettes, uncomplicated: Secondary | ICD-10-CM | POA: Insufficient documentation

## 2018-09-02 DIAGNOSIS — R110 Nausea: Secondary | ICD-10-CM | POA: Insufficient documentation

## 2018-09-02 DIAGNOSIS — M546 Pain in thoracic spine: Secondary | ICD-10-CM | POA: Insufficient documentation

## 2018-09-02 DIAGNOSIS — Z7984 Long term (current) use of oral hypoglycemic drugs: Secondary | ICD-10-CM | POA: Insufficient documentation

## 2018-09-02 DIAGNOSIS — J45909 Unspecified asthma, uncomplicated: Secondary | ICD-10-CM | POA: Insufficient documentation

## 2018-09-02 DIAGNOSIS — E119 Type 2 diabetes mellitus without complications: Secondary | ICD-10-CM | POA: Insufficient documentation

## 2018-09-02 DIAGNOSIS — Z79899 Other long term (current) drug therapy: Secondary | ICD-10-CM | POA: Insufficient documentation

## 2018-09-02 LAB — CBC
HCT: 48.9 % (ref 39.0–52.0)
Hemoglobin: 16.9 g/dL (ref 13.0–17.0)
MCH: 31.1 pg (ref 26.0–34.0)
MCHC: 34.6 g/dL (ref 30.0–36.0)
MCV: 89.9 fL (ref 80.0–100.0)
Platelets: 239 10*3/uL (ref 150–400)
RBC: 5.44 MIL/uL (ref 4.22–5.81)
RDW: 12.3 % (ref 11.5–15.5)
WBC: 11.6 10*3/uL — ABNORMAL HIGH (ref 4.0–10.5)
nRBC: 0 % (ref 0.0–0.2)

## 2018-09-02 LAB — URINALYSIS, COMPLETE (UACMP) WITH MICROSCOPIC
Bacteria, UA: NONE SEEN
Bilirubin Urine: NEGATIVE
Glucose, UA: >=500 mg/dL — AB
Hgb urine dipstick: NEGATIVE
Ketones, ur: 5 mg/dL — AB
Leukocytes,Ua: NEGATIVE
Nitrite: NEGATIVE
Protein, ur: NEGATIVE mg/dL
Specific Gravity, Urine: 1.033 — ABNORMAL HIGH (ref 1.005–1.030)
pH: 5 (ref 5.0–8.0)

## 2018-09-02 LAB — COMPREHENSIVE METABOLIC PANEL
ALT: 21 U/L (ref 0–44)
AST: 15 U/L (ref 15–41)
Albumin: 4.3 g/dL (ref 3.5–5.0)
Alkaline Phosphatase: 70 U/L (ref 38–126)
Anion gap: 7 (ref 5–15)
BUN: 22 mg/dL — ABNORMAL HIGH (ref 6–20)
CO2: 19 mmol/L — ABNORMAL LOW (ref 22–32)
Calcium: 9.1 mg/dL (ref 8.9–10.3)
Chloride: 109 mmol/L (ref 98–111)
Creatinine, Ser: 0.76 mg/dL (ref 0.61–1.24)
GFR calc Af Amer: >60 mL/min (ref >60)
GFR calc non Af Amer: >60 mL/min (ref >60)
Glucose, Bld: 318 mg/dL — ABNORMAL HIGH (ref 70–99)
Potassium: 4.6 mmol/L (ref 3.5–5.1)
Sodium: 135 mmol/L (ref 135–145)
Total Bilirubin: 0.4 mg/dL (ref 0.3–1.2)
Total Protein: 7.3 g/dL (ref 6.5–8.1)

## 2018-09-02 LAB — LACTATE DEHYDROGENASE: LDH: 107 U/L (ref 98–192)

## 2018-09-02 LAB — LIPASE, BLOOD: Lipase: 123 U/L — ABNORMAL HIGH (ref 11–51)

## 2018-09-02 MED ORDER — IBUPROFEN 100 MG/5ML PO SUSP
800.0000 mg | Freq: Once | ORAL | Status: AC
  Administered 2018-09-02: 800 mg via ORAL

## 2018-09-02 MED ORDER — IOHEXOL 240 MG/ML SOLN
50.0000 mL | Freq: Once | INTRAMUSCULAR | Status: AC | PRN
  Administered 2018-09-02: 50 mL via ORAL

## 2018-09-02 MED ORDER — IOHEXOL 300 MG/ML  SOLN
100.0000 mL | Freq: Once | INTRAMUSCULAR | Status: AC | PRN
  Administered 2018-09-02: 100 mL via INTRAVENOUS

## 2018-09-02 MED ORDER — SODIUM CHLORIDE 0.9% FLUSH: 3.0000 mL | Freq: Once | INTRAVENOUS | Status: DC

## 2018-09-02 MED ORDER — ONDANSETRON 8 MG PO TBDP: 8.0000 mg | ORAL_TABLET | Freq: Three times a day (TID) | ORAL | 0 refills | Status: DC | PRN

## 2018-09-02 MED ORDER — TRAMADOL HCL 50 MG PO TABS: 50.0000 mg | ORAL_TABLET | Freq: Four times a day (QID) | ORAL | 0 refills | Status: AC | PRN

## 2018-09-02 NOTE — ED Triage Notes (Signed)
Pt comes via pOV from home with c/o lower abdominal pain. Pt states this started about a week ago. Pt states some nausea. Pt states he has had some relief with ibuprofen.  Pt denies any fever, chills or SOB.  Pt states has had BM but does not feel like he completely emptied himself.

## 2018-09-02 NOTE — Discharge Instructions (Addendum)
You should stick to a clear liquid diet as much as possible over the next couple of days, then gradually advance to small light meals.  Avoid spicy foods, fatty foods, alcohol, or large heavy meals.  You can take the pain and nausea medications as needed.  Follow-up with your primary care doctor in the next 1 to 2 weeks.  Return to the ER for new, worsening, persistent severe pain, vomiting, fever, or any other new or worsening symptoms that concern you.

## 2018-09-02 NOTE — ED Provider Notes (Signed)
[facility] Emergency Department Provider Note ____________________________________________   First MD Initiated Contact with Patient 09/02/18 1726     (approximate)  I have reviewed the triage vital signs and the nursing notes.   HISTORY  Chief Complaint Abdominal Pain    HPI Calvin Butler is a 43 y.o. male with PMH as noted below who presents with lower abdominal pain over the last week, gradual onset, persistent course, and occurring bilaterally.  He reports mild intermittent nausea but denies any vomiting.  He states that sometimes he has pain in his mid back but denies any upper abdominal pain.  He states he had a large bowel movement yesterday.  He has no fever.  No prior history of this pain.  The patient reports only occasional alcohol use and has not had any alcohol recently.  Past Medical History:  Diagnosis Date  . Asthma   . Diabetes mellitus without complication (HCC)   . History of GI bleed    secondary to NSAIDs    Patient Active Problem List   Diagnosis Date Noted  . History of smoking 05/27/2016  . Uncomplicated asthma 05/27/2016  . Chronic fatigue 05/31/2015  . Diabetes (HCC) 08/23/2014  . Otitis externa 02/11/2012  . Hyperlipidemia 05/23/2011  . Obesity 02/17/2011    Past Surgical History:  Procedure Laterality Date  . FACIAL FRACTURE SURGERY  at 43 yrs old   Right side face - due to accident  . FACIAL FRACTURE SURGERY    . PERCUTANEOUS CORONARY STENT INTERVENTION (PCI-S)  2015    Prior to Admission medications   Medication Sig Start Date End Date Taking? Authorizing Provider  albuterol (PROVENTIL HFA;VENTOLIN HFA) 108 (90 Base) MCG/ACT inhaler Inhale 2 puffs into the lungs every 6 (six) hours as needed for wheezing or shortness of breath. Patient not taking: Reported on 04/09/2018 04/09/18   Allegra GranaArnett, Margaret G, FNP  glipiZIDE (GLUCOTROL) 5 MG tablet Take 1 tablet (5 mg total) by mouth 2 (two) times daily before a meal.  07/30/18   Allegra GranaArnett, Margaret G, FNP  glucose blood (ONE TOUCH ULTRA TEST) test strip Use as instructed 07/08/16   Allegra GranaArnett, Margaret G, FNP  ibuprofen (ADVIL,MOTRIN) 200 MG tablet Take 400-800 mg by mouth every 6 (six) hours as needed.    [provider]  Lancets Letta Pate(ONETOUCH ULTRASOFT) lancets Use as instructed 07/08/16   Allegra GranaArnett, Margaret G, FNP  metFORMIN (GLUCOPHAGE) 1000 MG tablet TAKE 1 TABLET BY MOUTH TWICE DAILY WITH A MEAL 08/02/18   Allegra GranaArnett, Margaret G, FNP  ondansetron (ZOFRAN ODT) 8 MG disintegrating tablet Take 1 tablet (8 mg total) by mouth every 8 (eight) hours as needed for nausea or vomiting. 09/02/18   Dionne BucySiadecki, Amariah Kierstead, MD  simvastatin (ZOCOR) 40 MG tablet Take 1 tablet (40 mg total) by mouth daily. 04/09/18   Allegra GranaArnett, Margaret G, FNP  traMADol (ULTRAM) 50 MG tablet Take 1 tablet (50 mg total) by mouth every 6 (six) hours as needed for up to 5 days. 09/02/18 09/07/18  Dionne BucySiadecki, Bo Teicher, MD    Allergies Patient has no known allergies.  Family History  Problem Relation Age of Onset  . Aneurysm Mother        Brain  . Diabetes Father   . Asthma Sister   . Heart attack Sister   . Diabetes Maternal Grandmother   . Arthritis Daughter   . Arthritis Daughter   . Prostate cancer Neg Hx     Social History Social History   Tobacco Use  .  Smoking status: Current Every Day Smoker    Packs/day: 1.00    Years: 27.00    Pack years: 27.00    Types: Cigarettes  . Smokeless tobacco: Never Used  Substance Use Topics  . Alcohol use: Yes    Alcohol/week: 0.0 standard drinks    Comment: Occasional  . Drug use: No    Review of Systems  Constitutional: No fever. Eyes: No redness. ENT: No sore throat. Cardiovascular: Denies chest pain. Respiratory: Denies shortness of breath. Gastrointestinal: Positive for nausea.  No vomiting or diarrhea. Genitourinary: Negative for dysuria.  Musculoskeletal: Positive for back pain. Skin: Negative for rash. Neurological: Negative for  headache.   ____________________________________________   PHYSICAL EXAM:  VITAL SIGNS: ED Triage Vitals [09/02/18 1522]  Enc Vitals Group     BP (!) 143/76     Pulse Rate 80     Resp 18     Temp 98.1 F (36.7 C)     Temp src      SpO2 96 %     Weight 200 lb (90.7 kg)     Height 6' (1.829 m)     Head Circumference      Peak Flow      Pain Score 8     Pain Loc      Pain Edu?      Excl. in [place]?     Constitutional: Alert and oriented.  Relatively well appearing and in no acute distress. Eyes: Conjunctivae are normal.  No scleral icterus. Head: Atraumatic. Nose: No congestion/rhinnorhea. Mouth/Throat: Mucous membranes are moist.   Neck: Normal range of motion.  Cardiovascular: Good peripheral circulation. Respiratory: Normal respiratory effort.  No retractions. Gastrointestinal: Mild to moderate bilateral lower quadrant tenderness, somewhat worse on the right.  No upper abdominal tenderness.  No distention.  Genitourinary: No flank tenderness. Musculoskeletal: Extremities warm and well perfused.  Neurologic:  Normal speech and language. No gross focal neurologic deficits are appreciated.  Skin:  Skin is warm and dry. No rash noted. Psychiatric: Mood and affect are normal. Speech and behavior are normal.  ____________________________________________   LABS (all labs ordered are listed, but only abnormal results are displayed)  Labs Reviewed  LIPASE, BLOOD - Abnormal; Notable for the following components:      Result Value   Lipase 123 (*)    All other components within normal limits  COMPREHENSIVE METABOLIC PANEL - Abnormal; Notable for the following components:   CO2 19 (*)    Glucose, Bld 318 (*)    BUN 22 (*)    All other components within normal limits  CBC - Abnormal; Notable for the following components:   WBC 11.6 (*)    All other components within normal limits  URINALYSIS, COMPLETE (UACMP) WITH MICROSCOPIC - Abnormal; Notable for the following  components:   Color, Urine YELLOW (*)    APPearance CLEAR (*)    Specific Gravity, Urine 1.033 (*)    Glucose, UA >=500 (*)    Ketones, ur 5 (*)    All other components within normal limits  LACTATE DEHYDROGENASE   ____________________________________________  EKG   ____________________________________________  RADIOLOGY  CT abdomen: No acute abnormalities  ____________________________________________   PROCEDURES  Procedure(s) performed: No  Procedures  Critical Care performed: No ____________________________________________   INITIAL IMPRESSION / ASSESSMENT AND PLAN / ED COURSE  Pertinent labs & imaging results that were available during my care of the patient were reviewed by me and considered in my medical decision making (see chart for  details).  43 year old male with a history of diabetes and other PMH as noted above presents primarily with bilateral lower abdominal pain and some back pain over the last week with intermittent nausea but no vomiting.  It does not have any relationship to when he eats.  He denies heavy alcohol use or any alcohol recently.  On exam the patient is overall well-appearing.  His vital signs are normal.  He states that the pain is improved with ibuprofen.  The abdomen is soft with bilateral lower quadrant tenderness but no epigastric tenderness.  Initial lab work-up obtained from triage reveals borderline WBC count and an elevated lipase.  However, the patient's clinical presentation is less consistent with acute pancreatitis.  Given the lower abdominal tenderness I will obtain a CT abdomen to further evaluate and rule out diverticulitis, colitis, appendicitis, or other acute intra-abdominal etiology.  ----------------------------------------- 8:10 PM on 09/02/2018 -----------------------------------------  CT shows no acute abnormalities.  Therefore, overall the presentation is most consistent with mild pancreatitis.  The patient has  had no vomiting and is tolerating p.o.  His vital signs are normal.  The remainder of his lab work-up is reassuring.  Therefore, he does not meet criteria for admission.  I counseled him on the results of the work-up.  He feels comfortable going home.  I will prescribe pain and nausea medication and I gave him instructions on dietary restrictions and advancing his diet.  I also gave thorough return precautions and he expressed understanding. ____________________________________________   FINAL CLINICAL IMPRESSION(S) / ED DIAGNOSES  Final diagnoses:  Acute pancreatitis without infection or necrosis, unspecified pancreatitis type      NEW MEDICATIONS STARTED DURING THIS VISIT:  Discharge Medication List as of 09/02/2018  8:02 PM    START taking these medications   Details  ondansetron (ZOFRAN ODT) 8 MG disintegrating tablet Take 1 tablet (8 mg total) by mouth every 8 (eight) hours as needed for nausea or vomiting., Starting Thu 09/02/2018, Normal    traMADol (ULTRAM) 50 MG tablet Take 1 tablet (50 mg total) by mouth every 6 (six) hours as needed for up to 5 days., Starting Thu 09/02/2018, Until Tue 09/07/2018, Normal         Note:  This document was prepared using Dragon voice recognition software and may include unintentional dictation errors.    Dionne BucySiadecki, Bastian Andreoli, MD 09/02/18 2011

## 2018-12-22 ENCOUNTER — Other Ambulatory Visit: Payer: Self-pay

## 2018-12-22 DIAGNOSIS — E119 Type 2 diabetes mellitus without complications: Secondary | ICD-10-CM

## 2018-12-22 MED ORDER — METFORMIN HCL 1000 MG PO TABS: ORAL_TABLET | ORAL | 0 refills | Status: DC

## 2018-12-22 MED ORDER — GLIPIZIDE 5 MG PO TABS: 5.0000 mg | ORAL_TABLET | Freq: Two times a day (BID) | ORAL | 3 refills | Status: DC

## 2019-02-13 ENCOUNTER — Encounter (HOSPITAL_COMMUNITY): Payer: Self-pay | Admitting: Emergency Medicine

## 2019-02-13 ENCOUNTER — Emergency Department (HOSPITAL_COMMUNITY): Payer: 59

## 2019-02-13 ENCOUNTER — Other Ambulatory Visit: Payer: Self-pay

## 2019-02-13 ENCOUNTER — Emergency Department (HOSPITAL_COMMUNITY)
Admission: EM | Admit: 2019-02-13 | Discharge: 2019-02-13 | Disposition: A | Discharge status: Home / Self Care | Payer: 59 | Attending: Emergency Medicine | Admitting: Emergency Medicine

## 2019-02-13 DIAGNOSIS — Z79899 Other long term (current) drug therapy: Secondary | ICD-10-CM | POA: Insufficient documentation

## 2019-02-13 DIAGNOSIS — E119 Type 2 diabetes mellitus without complications: Secondary | ICD-10-CM | POA: Diagnosis not present

## 2019-02-13 DIAGNOSIS — J45909 Unspecified asthma, uncomplicated: Secondary | ICD-10-CM | POA: Insufficient documentation

## 2019-02-13 DIAGNOSIS — F17210 Nicotine dependence, cigarettes, uncomplicated: Secondary | ICD-10-CM | POA: Diagnosis not present

## 2019-02-13 DIAGNOSIS — R109 Unspecified abdominal pain: Secondary | ICD-10-CM | POA: Diagnosis present

## 2019-02-13 DIAGNOSIS — Z7984 Long term (current) use of oral hypoglycemic drugs: Secondary | ICD-10-CM | POA: Insufficient documentation

## 2019-02-13 LAB — BASIC METABOLIC PANEL
Anion gap: 12 (ref 5–15)
BUN: 30 mg/dL — ABNORMAL HIGH (ref 6–20)
CO2: 30 mmol/L (ref 22–32)
Calcium: 9.5 mg/dL (ref 8.9–10.3)
Chloride: 97 mmol/L — ABNORMAL LOW (ref 98–111)
Creatinine, Ser: 1.33 mg/dL — ABNORMAL HIGH (ref 0.61–1.24)
GFR calc Af Amer: >60 mL/min (ref >60)
GFR calc non Af Amer: >60 mL/min (ref >60)
Glucose, Bld: 372 mg/dL — ABNORMAL HIGH (ref 70–99)
Potassium: 4.3 mmol/L (ref 3.5–5.1)
Sodium: 139 mmol/L (ref 135–145)

## 2019-02-13 LAB — URINALYSIS, ROUTINE W REFLEX MICROSCOPIC
Bacteria, UA: NONE SEEN
Bilirubin Urine: NEGATIVE
Glucose, UA: >=500 mg/dL — AB
Hgb urine dipstick: NEGATIVE
Ketones, ur: 5 mg/dL — AB
Leukocytes,Ua: NEGATIVE
Nitrite: NEGATIVE
Protein, ur: NEGATIVE mg/dL
Specific Gravity, Urine: 1.023 (ref 1.005–1.030)
pH: 6 (ref 5.0–8.0)

## 2019-02-13 LAB — CBC WITH DIFFERENTIAL/PLATELET
Abs Immature Granulocytes: 0.05 10*3/uL (ref 0.00–0.07)
Basophils Absolute: 0.1 10*3/uL (ref 0.0–0.1)
Basophils Relative: 0 %
Eosinophils Absolute: 0.1 10*3/uL (ref 0.0–0.5)
Eosinophils Relative: 1 %
HCT: 50.9 % (ref 39.0–52.0)
Hemoglobin: 17 g/dL (ref 13.0–17.0)
Immature Granulocytes: 0 %
Lymphocytes Relative: 28 %
Lymphs Abs: 3.7 10*3/uL (ref 0.7–4.0)
MCH: 30.9 pg (ref 26.0–34.0)
MCHC: 33.4 g/dL (ref 30.0–36.0)
MCV: 92.4 fL (ref 80.0–100.0)
Monocytes Absolute: 1.2 10*3/uL — ABNORMAL HIGH (ref 0.1–1.0)
Monocytes Relative: 9 %
Neutro Abs: 8 10*3/uL — ABNORMAL HIGH (ref 1.7–7.7)
Neutrophils Relative %: 62 %
Platelets: 257 10*3/uL (ref 150–400)
RBC: 5.51 MIL/uL (ref 4.22–5.81)
RDW: 12.4 % (ref 11.5–15.5)
WBC: 13.2 10*3/uL — ABNORMAL HIGH (ref 4.0–10.5)
nRBC: 0 % (ref 0.0–0.2)

## 2019-02-13 MED ORDER — ONDANSETRON HCL 4 MG/2ML IJ SOLN
4.0000 mg | Freq: Once | INTRAMUSCULAR | Status: AC
  Administered 2019-02-13: 06:00:00 4 mg via INTRAVENOUS
  Filled 2019-02-13: qty 2

## 2019-02-13 MED ORDER — MORPHINE SULFATE (PF) 4 MG/ML IV SOLN
4.0000 mg | Freq: Once | INTRAVENOUS | Status: AC
  Administered 2019-02-13: 4 mg via INTRAVENOUS
  Filled 2019-02-13: qty 1

## 2019-02-13 MED ORDER — OXYCODONE-ACETAMINOPHEN 5-325 MG PO TABS: 1.0000 | ORAL_TABLET | ORAL | 0 refills | Status: DC | PRN

## 2019-02-13 NOTE — Discharge Instructions (Addendum)
Take the prescribed medication as directed.  Do not drive while taking this.  Do not take extra tylenol while taking this either. Follow-up with your primary care doctor if any ongoing issues. Return to the ED for new or worsening symptoms.

## 2019-02-13 NOTE — ED Notes (Signed)
Pt verbalized understanding of d/c instructions, follow up, prescriptions and s/s requiring return to ED. Pt had no further questions at this time.

## 2019-02-13 NOTE — ED Provider Notes (Signed)
[facility] EMERGENCY DEPARTMENT Provider Note   CSN: [id number] Arrival date & time: 02/13/19  0502     History Chief Complaint  Patient presents with  . Abdominal Pain  . Flank Pain    Calvin Butler is a 44 y.o. male.  The history is provided by the patient and medical records.    44 y.o. M with hx of asthma, DM, presenting to the ED for right sided flank pain for the past week.  States he has not been sure if it is more his abdomen of her stomach.  States pain is actually worse in the back but does radiate to right side of the abdomen.  He denies any difficulty urinating or hematuria.  No fever or chills.  He has not had any nausea or vomiting.  He denies any history of same in the past.  No known history of kidney stones.  He has been taking Pepto, Tums, and ibuprofen without relief.  Past Medical History:  Diagnosis Date  . Asthma   . Diabetes mellitus without complication (HCC)   . History of GI bleed    secondary to NSAIDs    Patient Active Problem List   Diagnosis Date Noted  . History of smoking 05/27/2016  . Uncomplicated asthma 05/27/2016  . Chronic fatigue 05/31/2015  . Diabetes (HCC) 08/23/2014  . Otitis externa 02/11/2012  . Hyperlipidemia 05/23/2011  . Obesity 02/17/2011    Past Surgical History:  Procedure Laterality Date  . FACIAL FRACTURE SURGERY  at 44 yrs old   Right side face - due to accident  . FACIAL FRACTURE SURGERY    . PERCUTANEOUS CORONARY STENT INTERVENTION (PCI-S)  2015       Family History  Problem Relation Age of Onset  . Aneurysm Mother        Brain  . Diabetes Father   . Asthma Sister   . Heart attack Sister   . Diabetes Maternal Grandmother   . Arthritis Daughter   . Arthritis Daughter   . Prostate cancer Neg Hx     Social History   Tobacco Use  . Smoking status: Current Every Day Smoker    Packs/day: 1.00    Years: 27.00    Pack years: 27.00    Types: Cigarettes  . Smokeless tobacco:  Never Used  Substance Use Topics  . Alcohol use: Yes    Alcohol/week: 0.0 standard drinks    Comment: Occasional  . Drug use: No    Home Medications Prior to Admission medications   Medication Sig Start Date End Date Taking? Authorizing Provider  albuterol (PROVENTIL HFA;VENTOLIN HFA) 108 (90 Base) MCG/ACT inhaler Inhale 2 puffs into the lungs every 6 (six) hours as needed for wheezing or shortness of breath. Patient not taking: Reported on 04/09/2018 04/09/18   Allegra Grana, FNP  glipiZIDE (GLUCOTROL) 5 MG tablet Take 1 tablet (5 mg total) by mouth 2 (two) times daily before a meal. 12/22/18   Allegra Grana, FNP  glucose blood (ONE TOUCH ULTRA TEST) test strip Use as instructed 07/08/16   Allegra Grana, FNP  ibuprofen (ADVIL,MOTRIN) 200 MG tablet Take 400-800 mg by mouth every 6 (six) hours as needed.    [provider]  Lancets Letta Pate ULTRASOFT) lancets Use as instructed 07/08/16   Allegra Grana, FNP  metFORMIN (GLUCOPHAGE) 1000 MG tablet TAKE 1 TABLET BY MOUTH TWICE DAILY WITH A MEAL 12/22/18   Allegra Grana, FNP  ondansetron (ZOFRAN ODT) 8  MG disintegrating tablet Take 1 tablet (8 mg total) by mouth every 8 (eight) hours as needed for nausea or vomiting. 09/02/18   Arta Silence, MD  simvastatin (ZOCOR) 40 MG tablet Take 1 tablet (40 mg total) by mouth daily. 04/09/18   Burnard Hawthorne, FNP    Allergies    Patient has no known allergies.  Review of Systems   Review of Systems  Genitourinary: Positive for flank pain.  All other systems reviewed and are negative.   Physical Exam Updated Vital Signs BP (!) 146/93   Pulse 84   Temp 98.1 F (36.7 C)   Resp 18   SpO2 96%   Physical Exam Vitals and nursing note reviewed.  Constitutional:      Appearance: He is well-developed.     Comments: Appears somewhat uncomfortable  HENT:     Head: Normocephalic and atraumatic.  Eyes:     Conjunctiva/sclera: Conjunctivae normal.     Pupils:  Pupils are equal, round, and reactive to light.  Cardiovascular:     Rate and Rhythm: Normal rate and regular rhythm.     Heart sounds: Normal heart sounds.  Pulmonary:     Effort: Pulmonary effort is normal.     Breath sounds: Normal breath sounds.  Abdominal:     General: Bowel sounds are normal.     Palpations: Abdomen is soft.     Tenderness: There is no abdominal tenderness.     Comments: Soft, non-tender  Musculoskeletal:        General: Normal range of motion.     Cervical back: Normal range of motion.  Skin:    General: Skin is warm and dry.  Neurological:     Mental Status: He is alert and oriented to person, place, and time.     ED Results / Procedures / Treatments   Labs (all labs ordered are listed, but only abnormal results are displayed) Labs Reviewed  CBC WITH DIFFERENTIAL/PLATELET - Abnormal; Notable for the following components:      Result Value   WBC 13.2 (*)    Neutro Abs 8.0 (*)    Monocytes Absolute 1.2 (*)    All other components within normal limits  BASIC METABOLIC PANEL - Abnormal; Notable for the following components:   Chloride 97 (*)    Glucose, Bld 372 (*)    BUN 30 (*)    Creatinine, Ser 1.33 (*)    All other components within normal limits  URINALYSIS, ROUTINE W REFLEX MICROSCOPIC - Abnormal; Notable for the following components:   Color, Urine STRAW (*)    Glucose, UA >=500 (*)    Ketones, ur 5 (*)    All other components within normal limits    EKG None  Radiology CT Renal Stone Study  Result Date: 02/13/2019 CLINICAL DATA:  Right flank pain EXAM: CT ABDOMEN AND PELVIS WITHOUT CONTRAST TECHNIQUE: Multidetector CT imaging of the abdomen and pelvis was performed following the standard protocol without IV contrast. COMPARISON:  09/02/2018 FINDINGS: Lower chest: Lung bases are clear. Hepatobiliary: Unenhanced liver is unremarkable. Gallbladder is underdistended. No intrahepatic or extrahepatic ductal dilatation. Pancreas: Within normal  limits. Spleen: Within normal limits. Adrenals/Urinary Tract: Adrenal glands are within normal limits. Kidneys are within normal limits. No renal, ureteral, or bladder calculi. No hydronephrosis. Mildly thick-walled bladder, although underdistended. Stomach/Bowel: Stomach is within normal limits. No evidence of bowel obstruction. Normal appendix (series 3/image 66). Vascular/Lymphatic: No evidence of abdominal aortic aneurysm. Very mild atherosclerotic calcifications of the right  common iliac artery. No suspicious abdominopelvic lymphadenopathy. Reproductive: Prostate is notable for dystrophic calcifications. Other: No abdominopelvic ascites. Musculoskeletal: Mild degenerative changes at T11-12. 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 IMPRESSION: Negative CT abdomen/pelvis. Electronically Signed   By: Charline Bills M.D.   On: 02/13/2019 06:03    Procedures Procedures (including critical care time)  Medications Ordered in ED Medications  morphine 4 MG/ML injection 4 mg (4 mg Intravenous Given 02/13/19 0539)  ondansetron (ZOFRAN) injection 4 mg (4 mg Intravenous Given 02/13/19 0539)    ED Course  I have reviewed the triage vital signs and the nursing notes.  Pertinent labs & imaging results that were available during my care of the patient were reviewed by me and considered in my medical decision making (see chart for details).    MDM Rules/Calculators/A&P  44 year old male presenting to the ED with right flank pain for the past week.  He is afebrile and nontoxic.  No significant tenderness on exam.  No history of kidney stones.  Will obtain screening labs, UA, and renal stone study.  He was given pain and nausea medication.  Labs grossly reassuring.  Does have hyperglycemia without evidence of DKA.  He admits to eating a lot before coming in, was trying to force a bowel movement.  UA without any signs of infection.  CT renal stone study is normal-- does have chronic deformity of  right L1 transverse process.  Unclear etiology of symptoms, question recently passed stone causing some residual renal colic.  He is pain free after medications here.  Plan to d/c home with pain control and close follow-up with PCP.  Return here for any new/acute changes.  Final Clinical Impression(s) / ED Diagnoses Final diagnoses:  Right flank pain    Rx / DC Orders ED Discharge Orders         Ordered    oxyCODONE-acetaminophen (PERCOCET) 5-325 MG tablet  Every 4 hours PRN     02/13/19 0615           Garlon Hatchet, PA-C 02/13/19 0631    Sabas Sous, MD 02/13/19 2255

## 2019-02-13 NOTE — ED Triage Notes (Signed)
Pt reports RLQ pain that radiates around to his R flank, pain began last weekend. States that he has been taking ibuprofen without relief. Denies any urinary symptoms.

## 2019-03-26 ENCOUNTER — Other Ambulatory Visit: Payer: Self-pay | Admitting: Family

## 2019-03-26 DIAGNOSIS — E119 Type 2 diabetes mellitus without complications: Secondary | ICD-10-CM

## 2019-03-31 ENCOUNTER — Other Ambulatory Visit: Payer: Self-pay | Admitting: Family

## 2019-03-31 DIAGNOSIS — E119 Type 2 diabetes mellitus without complications: Secondary | ICD-10-CM

## 2019-04-04 ENCOUNTER — Other Ambulatory Visit: Payer: Self-pay

## 2019-04-04 DIAGNOSIS — E119 Type 2 diabetes mellitus without complications: Secondary | ICD-10-CM

## 2019-04-04 MED ORDER — METFORMIN HCL 1000 MG PO TABS: ORAL_TABLET | ORAL | 0 refills | Status: DC

## 2019-05-12 ENCOUNTER — Other Ambulatory Visit: Payer: Self-pay | Admitting: Family

## 2019-05-12 DIAGNOSIS — E119 Type 2 diabetes mellitus without complications: Secondary | ICD-10-CM

## 2019-06-22 ENCOUNTER — Other Ambulatory Visit: Payer: Self-pay

## 2019-06-22 ENCOUNTER — Other Ambulatory Visit: Payer: Self-pay | Admitting: Family

## 2019-06-22 DIAGNOSIS — E119 Type 2 diabetes mellitus without complications: Secondary | ICD-10-CM

## 2019-06-22 MED ORDER — METFORMIN HCL 1000 MG PO TABS: ORAL_TABLET | ORAL | 0 refills | Status: DC

## 2019-06-22 MED ORDER — GLIPIZIDE 5 MG PO TABS: ORAL_TABLET | ORAL | 0 refills | Status: DC

## 2019-08-29 ENCOUNTER — Telehealth: Payer: Self-pay | Admitting: *Deleted

## 2019-08-29 ENCOUNTER — Other Ambulatory Visit: Payer: Self-pay | Admitting: Family

## 2019-08-29 DIAGNOSIS — E119 Type 2 diabetes mellitus without complications: Secondary | ICD-10-CM

## 2019-08-29 NOTE — Telephone Encounter (Signed)
XQJJHE from [facility] called requesting a refill on patient's glipizide. [name] that he is not a patient here at this office and gave her the name of his provider. [name] stated that Calvin Deter NP did refill his Glipizide today  but stated that patient was switching over to Calvin Butler. [name] at this point patient is not a patient here in our office until he comes in and is established with a doctor in this office. [name] at [facility] stated that she will inform patient that until he is established at our office we will not be able to refill any of his medications.

## 2019-10-05 ENCOUNTER — Emergency Department (HOSPITAL_COMMUNITY)
Admission: EM | Admit: 2019-10-05 | Discharge: 2019-10-05 | Disposition: A | Discharge status: Home / Self Care | Payer: 59 | Attending: Emergency Medicine | Admitting: Emergency Medicine

## 2019-10-05 ENCOUNTER — Ambulatory Visit (HOSPITAL_COMMUNITY)
Admission: EM | Admit: 2019-10-05 | Discharge: 2019-10-05 | Disposition: A | Discharge status: Home / Self Care | Payer: 59 | Attending: Nurse Practitioner | Admitting: Nurse Practitioner

## 2019-10-05 ENCOUNTER — Encounter (HOSPITAL_COMMUNITY): Payer: Self-pay | Admitting: *Deleted

## 2019-10-05 ENCOUNTER — Other Ambulatory Visit: Payer: Self-pay

## 2019-10-05 ENCOUNTER — Emergency Department (HOSPITAL_COMMUNITY): Payer: 59

## 2019-10-05 DIAGNOSIS — E119 Type 2 diabetes mellitus without complications: Secondary | ICD-10-CM | POA: Diagnosis not present

## 2019-10-05 DIAGNOSIS — F419 Anxiety disorder, unspecified: Secondary | ICD-10-CM | POA: Insufficient documentation

## 2019-10-05 DIAGNOSIS — Z20822 Contact with and (suspected) exposure to covid-19: Secondary | ICD-10-CM | POA: Insufficient documentation

## 2019-10-05 DIAGNOSIS — F17210 Nicotine dependence, cigarettes, uncomplicated: Secondary | ICD-10-CM | POA: Diagnosis not present

## 2019-10-05 DIAGNOSIS — R450 Nervousness: Secondary | ICD-10-CM | POA: Diagnosis present

## 2019-10-05 DIAGNOSIS — F4323 Adjustment disorder with mixed anxiety and depressed mood: Secondary | ICD-10-CM | POA: Diagnosis not present

## 2019-10-05 DIAGNOSIS — Z7984 Long term (current) use of oral hypoglycemic drugs: Secondary | ICD-10-CM | POA: Insufficient documentation

## 2019-10-05 DIAGNOSIS — Z79899 Other long term (current) drug therapy: Secondary | ICD-10-CM | POA: Diagnosis not present

## 2019-10-05 DIAGNOSIS — F329 Major depressive disorder, single episode, unspecified: Secondary | ICD-10-CM | POA: Diagnosis not present

## 2019-10-05 DIAGNOSIS — F32A Depression, unspecified: Secondary | ICD-10-CM

## 2019-10-05 DIAGNOSIS — J45909 Unspecified asthma, uncomplicated: Secondary | ICD-10-CM | POA: Diagnosis not present

## 2019-10-05 LAB — COMPREHENSIVE METABOLIC PANEL
ALT: 17 U/L (ref 0–44)
AST: 14 U/L — ABNORMAL LOW (ref 15–41)
Albumin: 3.9 g/dL (ref 3.5–5.0)
Alkaline Phosphatase: 62 U/L (ref 38–126)
Anion gap: 9 (ref 5–15)
BUN: 12 mg/dL (ref 6–20)
CO2: 22 mmol/L (ref 22–32)
Calcium: 8.9 mg/dL (ref 8.9–10.3)
Chloride: 108 mmol/L (ref 98–111)
Creatinine, Ser: 0.95 mg/dL (ref 0.61–1.24)
GFR calc Af Amer: >60 mL/min (ref >60)
GFR calc non Af Amer: >60 mL/min (ref >60)
Glucose, Bld: 177 mg/dL — ABNORMAL HIGH (ref 70–99)
Potassium: 3.5 mmol/L (ref 3.5–5.1)
Sodium: 139 mmol/L (ref 135–145)
Total Bilirubin: 0.5 mg/dL (ref 0.3–1.2)
Total Protein: 6.9 g/dL (ref 6.5–8.1)

## 2019-10-05 LAB — RAPID URINE DRUG SCREEN, HOSP PERFORMED
Amphetamines: NOT DETECTED
Barbiturates: NOT DETECTED
Benzodiazepines: NOT DETECTED
Cocaine: NOT DETECTED
Opiates: NOT DETECTED
Tetrahydrocannabinol: NOT DETECTED

## 2019-10-05 LAB — ETHANOL: Alcohol, Ethyl (B): <10 mg/dL (ref <10)

## 2019-10-05 LAB — CBC
HCT: 52.6 % — ABNORMAL HIGH (ref 39.0–52.0)
Hemoglobin: 17.3 g/dL — ABNORMAL HIGH (ref 13.0–17.0)
MCH: 30.4 pg (ref 26.0–34.0)
MCHC: 32.9 g/dL (ref 30.0–36.0)
MCV: 92.4 fL (ref 80.0–100.0)
Platelets: 274 10*3/uL (ref 150–400)
RBC: 5.69 MIL/uL (ref 4.22–5.81)
RDW: 13.2 % (ref 11.5–15.5)
WBC: 12.4 10*3/uL — ABNORMAL HIGH (ref 4.0–10.5)
nRBC: 0 % (ref 0.0–0.2)

## 2019-10-05 LAB — SARS CORONAVIRUS 2 BY RT PCR (HOSPITAL ORDER, PERFORMED IN ~~LOC~~ HOSPITAL LAB): SARS Coronavirus 2: NEGATIVE

## 2019-10-05 LAB — TROPONIN I (HIGH SENSITIVITY): Troponin I (High Sensitivity): 7 ng/L (ref <18)

## 2019-10-05 LAB — CBG MONITORING, ED: Glucose-Capillary: 185 mg/dL — ABNORMAL HIGH (ref 70–99)

## 2019-10-05 LAB — ACETAMINOPHEN LEVEL: Acetaminophen (Tylenol), Serum: <10 ug/mL — ABNORMAL LOW (ref 10–30)

## 2019-10-05 LAB — SALICYLATE LEVEL: Salicylate Lvl: <7 mg/dL — ABNORMAL LOW (ref 7.0–30.0)

## 2019-10-05 MED ORDER — SERTRALINE HCL 25 MG PO TABS: 25.0000 mg | ORAL_TABLET | Freq: Every day | ORAL | 0 refills | Status: DC

## 2019-10-05 MED ORDER — NICOTINE 14 MG/24HR TD PT24
14.0000 mg | MEDICATED_PATCH | Freq: Once | TRANSDERMAL | Status: DC
  Administered 2019-10-05: 14 mg via TRANSDERMAL
  Filled 2019-10-05: qty 1

## 2019-10-05 MED ORDER — HYDROXYZINE PAMOATE 25 MG PO CAPS: 25.0000 mg | ORAL_CAPSULE | Freq: Three times a day (TID) | ORAL | 0 refills | Status: DC | PRN

## 2019-10-05 MED ORDER — TRAZODONE HCL 50 MG PO TABS: 50.0000 mg | ORAL_TABLET | Freq: Every evening | ORAL | 0 refills | Status: DC | PRN

## 2019-10-05 NOTE — ED Notes (Signed)
Called pt for vitals twice no answer

## 2019-10-05 NOTE — ED Provider Notes (Signed)
Behavioral Health Urgent Care Medical Screening Exam  Patient Name: Calvin Butler MRN: [id number] Date of Evaluation: 10/05/19 Chief Complaint:   Diagnosis:  Final diagnoses:  Adjustment disorder with mixed anxiety and depressed mood    History of Present illness: KEMAR PANDIT is a 44 y.o. male who presents to [facility] voluntarily due to depression and anxiety after his wife of two years left him a little over one month ago.  Patient was evaluated in the emergency department earlier today for chest pain, he was medically cleared and was allowed to drive himself to [facility] for evaluation.  He reports that he realizes that the chest pain is related to his anxiety.  He states that when he was around 50 or 16 he was psychiatrically hospitalized for behavior issues and was started on Xanax.  He states that he was emancipated as a teenager and signed himself out of the hospital.  He denies any psychiatric treatment since that time.  He reports that he is usually able to control his anxiety, but it has gotten worse since his wife left.  Patient ruminates on his separation from his wife.  He states that his wife has a substance abuse issue and recently met a younger male.  He reports difficulty sleeping, trouble focusing, decreased appetite, weight loss. Patient denies suicidal ideations.  He denies homicidal ideations.  He denies auditory and visual hallucinations.  He does not appear to be responding to internal stimuli.  No evidence of delusional thought content.   Psychiatric Specialty Exam  Presentation  General Appearance:Appropriate for Environment;Casual;Neat  Eye Contact:Good  Speech:Clear and Coherent;Normal Rate  Speech Volume:Normal  Handedness:No data recorded  Mood and Affect  Mood:Anxious;Depressed  Affect:Congruent   Thought Process  Thought Processes:Coherent;Goal Directed;Linear  Descriptions of Associations:Intact  Orientation:Full (Time, Place and Person)  Thought  Content:WDL  Hallucinations:None  Ideas of Reference:None  Suicidal Thoughts:No  Homicidal Thoughts:No   Sensorium  Memory:Immediate Good;Recent Good;Remote Good  Judgment:Fair  Insight:Fair   Executive Functions  Concentration:Good  Attention Span:Good  Recall:Good  Fund of Knowledge:Good  Language:Good   Psychomotor Activity  Psychomotor Activity:Normal   Assets  Assets:Communication Skills;Physical Health   Sleep  Sleep:Poor  Number of hours: No data recorded  Physical Exam: Physical Exam Constitutional:      General: He is not in acute distress.    Appearance: He is not ill-appearing, toxic-appearing or diaphoretic.  HENT:     Head: Normocephalic.     Right Ear: External ear normal.     Left Ear: External ear normal.  Eyes:     Pupils: Pupils are equal, round, and reactive to light.  Cardiovascular:     Rate and Rhythm: Normal rate.  Pulmonary:     Effort: Pulmonary effort is normal. No respiratory distress.  Musculoskeletal:        General: Normal range of motion.  Skin:    General: Skin is warm and dry.  Neurological:     Mental Status: He is alert and oriented to person, place, and time.  Psychiatric:        Mood and Affect: Mood is anxious and depressed.        Speech: Speech normal.        Behavior: Behavior is cooperative.        Thought Content: Thought content is not paranoid or delusional. Thought content does not include homicidal or suicidal ideation. Thought content does not include suicidal plan.    Review of Systems  Constitutional: Negative for  chills, diaphoresis, fever, malaise/fatigue and weight loss.  HENT: Negative for congestion.   Respiratory: Negative for cough and shortness of breath.   Cardiovascular: Negative for chest pain and palpitations.  Gastrointestinal: Negative for diarrhea, nausea and vomiting.  Neurological: Negative for dizziness and seizures.  Psychiatric/Behavioral: Positive for depression and  suicidal ideas. Negative for hallucinations, memory loss and substance abuse. The patient is nervous/anxious and has insomnia.   All other systems reviewed and are negative.  Blood pressure 131/86, pulse 63, temperature 97.8 F (36.6 C), temperature source Tympanic, resp. rate 18, height '5\' 7"'  (1.702 m), weight 86.2 kg, SpO2 97 %. Body mass index is 29.76 kg/m.  Musculoskeletal: Strength & Muscle Tone: within normal limits Gait & Station: normal Patient leans: N/A   [place] [facility] Discharge Disposition for Follow up and Recommendations: Based on my evaluation the patient does not appear to have an emergency medical condition and can be discharged with resources and follow up care in outpatient services for Medication Management and Individual Therapy   Patient is interested in medication management and therapy.  He inquired about starting medications.  Discussed starting Zoloft 25 mg for depression/anxiety, hydroxyzine 25 prn for anxiety, and trazodone 50 mg as needed for sleep.  Discussed risk/benefits and side effects of each of these medications.  Patient requested that prescriptions be sent to a 24-hour pharmacy.  Patient states that he will contact the [facility] tomorrow to schedule an appointment.     Disposition: No evidence of imminent risk to self or others at present.   Patient does not meet criteria for psychiatric inpatient admission. Supportive therapy provided about ongoing stressors. Discussed crisis plan, support from social network, calling 911, coming to the Emergency Department, and calling Suicide Hotline.      Rozetta Nunnery, NP 10/05/2019, 10:31 PM

## 2019-10-05 NOTE — ED Notes (Signed)
Reviewed discharge instructions with patient. Follow-up care with [facility] reviewed. Patient verbalized understanding. Patient A&Ox4, VSS, and ambulatory with steady gait upon discharge.

## 2019-10-05 NOTE — ED Notes (Signed)
Pt belongings placed in locker #28.  

## 2019-10-05 NOTE — ED Triage Notes (Signed)
Pt tearful on arrival. States his wife left him 1 month ago and he no longer wants to live. Denies specific plan. Instructed to take slow deep breaths. He reports chest pain and sob.
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
## 2019-10-05 NOTE — ED Notes (Signed)
Lunch ordered 

## 2019-10-05 NOTE — Discharge Instructions (Addendum)
Go directly to the [facility] for further assistance and management.
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
## 2020-01-24 ENCOUNTER — Other Ambulatory Visit: Payer: Self-pay

## 2020-01-24 ENCOUNTER — Encounter: Payer: Self-pay | Admitting: Emergency Medicine

## 2020-01-24 ENCOUNTER — Emergency Department
Admission: EM | Admit: 2020-01-24 | Discharge: 2020-01-24 | Disposition: A | Discharge status: Home / Self Care | Payer: 59 | Attending: Emergency Medicine | Admitting: Emergency Medicine

## 2020-01-24 DIAGNOSIS — U071 COVID-19: Secondary | ICD-10-CM | POA: Diagnosis not present

## 2020-01-24 DIAGNOSIS — H81399 Other peripheral vertigo, unspecified ear: Secondary | ICD-10-CM | POA: Insufficient documentation

## 2020-01-24 DIAGNOSIS — J45909 Unspecified asthma, uncomplicated: Secondary | ICD-10-CM | POA: Insufficient documentation

## 2020-01-24 DIAGNOSIS — F17210 Nicotine dependence, cigarettes, uncomplicated: Secondary | ICD-10-CM | POA: Diagnosis not present

## 2020-01-24 DIAGNOSIS — E1165 Type 2 diabetes mellitus with hyperglycemia: Secondary | ICD-10-CM | POA: Insufficient documentation

## 2020-01-24 DIAGNOSIS — Z7984 Long term (current) use of oral hypoglycemic drugs: Secondary | ICD-10-CM | POA: Insufficient documentation

## 2020-01-24 DIAGNOSIS — Z79899 Other long term (current) drug therapy: Secondary | ICD-10-CM | POA: Diagnosis not present

## 2020-01-24 DIAGNOSIS — R42 Dizziness and giddiness: Secondary | ICD-10-CM | POA: Diagnosis present

## 2020-01-24 LAB — URINALYSIS, COMPLETE (UACMP) WITH MICROSCOPIC
Bacteria, UA: NONE SEEN
Bilirubin Urine: NEGATIVE
Glucose, UA: >=500 mg/dL — AB
Hgb urine dipstick: NEGATIVE
Ketones, ur: 5 mg/dL — AB
Leukocytes,Ua: NEGATIVE
Nitrite: NEGATIVE
Protein, ur: 30 mg/dL — AB
Specific Gravity, Urine: 1.042 — ABNORMAL HIGH (ref 1.005–1.030)
Squamous Epithelial / HPF: NONE SEEN (ref 0–5)
pH: 5 (ref 5.0–8.0)

## 2020-01-24 LAB — BASIC METABOLIC PANEL
Anion gap: 9 (ref 5–15)
BUN: 21 mg/dL — ABNORMAL HIGH (ref 6–20)
CO2: 26 mmol/L (ref 22–32)
Calcium: 9.1 mg/dL (ref 8.9–10.3)
Chloride: 101 mmol/L (ref 98–111)
Creatinine, Ser: 1.01 mg/dL (ref 0.61–1.24)
GFR, Estimated: >60 mL/min (ref >60)
Glucose, Bld: 326 mg/dL — ABNORMAL HIGH (ref 70–99)
Potassium: 4.7 mmol/L (ref 3.5–5.1)
Sodium: 136 mmol/L (ref 135–145)

## 2020-01-24 LAB — CBC
HCT: 51.8 % (ref 39.0–52.0)
Hemoglobin: 18.4 g/dL — ABNORMAL HIGH (ref 13.0–17.0)
MCH: 31.2 pg (ref 26.0–34.0)
MCHC: 35.5 g/dL (ref 30.0–36.0)
MCV: 87.9 fL (ref 80.0–100.0)
Platelets: 180 10*3/uL (ref 150–400)
RBC: 5.89 MIL/uL — ABNORMAL HIGH (ref 4.22–5.81)
RDW: 12.7 % (ref 11.5–15.5)
WBC: 6.8 10*3/uL (ref 4.0–10.5)
nRBC: 0 % (ref 0.0–0.2)

## 2020-01-24 MED ORDER — MECLIZINE HCL 50 MG PO TABS: 50.0000 mg | ORAL_TABLET | Freq: Three times a day (TID) | ORAL | 0 refills | Status: DC | PRN

## 2020-01-24 NOTE — Discharge Instructions (Addendum)
Take the meclizine as needed for vertigo symptoms.  We will call you if your Covid test is positive.  Return to the ER for new, worsening, or persistent severe dizziness, vomiting, headache, hearing loss, or any other new or worsening symptoms that concern you.

## 2020-01-24 NOTE — ED Provider Notes (Signed)
[facility] Emergency Department Provider Note ____________________________________________   Event Date/Time   First MD Initiated Contact with Patient 01/24/20 1419     (approximate)  I have reviewed the triage vital signs and the nursing notes.   HISTORY  Chief Complaint Dizziness and Ear Pain    HPI Calvin Butler is a 45 y.o. male with PMH as noted below who presents with dizziness, acute onset yesterday morning when he awoke, and described as movement or spinning sensation, worse when he moves around or walks, and resolving when he sits still.  He states that when he walks he feels like he is deviating towards one side and has hit a wall..  Sometimes he reports associated lightheadedness or mild frontal headache with it, but no nausea or vomiting.  This morning he had some bilateral ear pain but this has resolved.  He denies any hearing loss.  He has no prior history of this symptom.  Past Medical History:  Diagnosis Date  . Asthma   . Diabetes mellitus without complication ([place])   . History of GI bleed    secondary to NSAIDs    Patient Active Problem List   Diagnosis Date Noted  . History of smoking 05/27/2016  . Uncomplicated asthma 52/84/1324  . Chronic fatigue 05/31/2015  . Diabetes ([place]) 08/23/2014  . Otitis externa 02/11/2012  . Hyperlipidemia 05/23/2011  . Obesity 02/17/2011    Past Surgical History:  Procedure Laterality Date  . FACIAL FRACTURE SURGERY  at 45 yrs old   Right side face - due to accident  . FACIAL FRACTURE SURGERY    . PERCUTANEOUS CORONARY STENT INTERVENTION (PCI-S)  2015    Prior to Admission medications   Medication Sig Start Date End Date Taking? Authorizing Provider  meclizine (ANTIVERT) 50 MG tablet Take 1 tablet (50 mg total) by mouth 3 (three) times daily as needed for dizziness. 01/24/20  Yes Arta Silence, MD  albuterol (PROVENTIL HFA;VENTOLIN HFA) 108 (90 Base) MCG/ACT inhaler Inhale 2 puffs into  the lungs every 6 (six) hours as needed for wheezing or shortness of breath. Patient not taking: Reported on 04/09/2018 04/09/18   Burnard Hawthorne, FNP  glipiZIDE (GLUCOTROL) 5 MG tablet TAKE 1 TABLET BY MOUTH TWICE DAILY BEFORE MEAL . APPOINTMENT REQUIRED FOR FUTURE REFILLS 08/29/19   Burnard Hawthorne, FNP  glucose blood (ONE TOUCH ULTRA TEST) test strip Use as instructed 07/08/16   Burnard Hawthorne, FNP  hydrOXYzine (VISTARIL) 25 MG capsule Take 1 capsule (25 mg total) by mouth 3 (three) times daily as needed for anxiety. 10/05/19   Rozetta Nunnery, NP  ibuprofen (ADVIL,MOTRIN) 200 MG tablet Take 400-800 mg by mouth every 6 (six) hours as needed.    [provider]  Lancets Glory Rosebush ULTRASOFT) lancets Use as instructed 07/08/16   Burnard Hawthorne, FNP  metFORMIN (GLUCOPHAGE) 1000 MG tablet TAKE 1 TABLET BY MOUTH TWICE DAILY WITH A MEAL 06/22/19   Burnard Hawthorne, FNP  sertraline (ZOLOFT) 25 MG tablet Take 1 tablet (25 mg total) by mouth daily. 10/05/19 10/04/20  Rozetta Nunnery, NP  simvastatin (ZOCOR) 40 MG tablet Take 1 tablet (40 mg total) by mouth daily. 04/09/18   Burnard Hawthorne, FNP  traZODone (DESYREL) 50 MG tablet Take 1 tablet (50 mg total) by mouth at bedtime as needed for sleep. 10/05/19   Rozetta Nunnery, NP    Allergies Patient has no known allergies.  Family History  Problem Relation Age of  Onset  . Aneurysm Mother        Brain  . Diabetes Father   . Asthma Sister   . Heart attack Sister   . Diabetes Maternal Grandmother   . Arthritis Daughter   . Arthritis Daughter   . Prostate cancer Neg Hx     Social History Social History   Tobacco Use  . Smoking status: Current Every Day Smoker    Packs/day: 1.00    Years: 27.00    Pack years: 27.00    Types: Cigarettes  . Smokeless tobacco: Never Used  Substance Use Topics  . Alcohol use: Yes    Alcohol/week: 0.0 standard drinks    Comment: Occasional  . Drug use: No    Review of Systems  Constitutional:  No fever/chills Eyes: No visual changes. ENT: Positive for sore throat.  Positive for resolved ear pain. Cardiovascular: Denies chest pain. Respiratory: Denies shortness of breath. Gastrointestinal: No vomiting or diarrhea.  Genitourinary: Negative for dysuria.  Musculoskeletal: Negative for back pain. Skin: Negative for rash. Neurological: Negative for focal weakness or numbness.   ____________________________________________   PHYSICAL EXAM:  VITAL SIGNS: ED Triage Vitals [01/24/20 1237]  Enc Vitals Group     BP 113/77     Pulse Rate 76     Resp 18     Temp 98.3 F (36.8 C)     Temp Source Oral     SpO2 100 %     Weight 183 lb (83 kg)     Height 5\' 7"  (1.702 m)     Head Circumference      Peak Flow      Pain Score      Pain Loc      Pain Edu?      Excl. in GC?     Constitutional: Alert and oriented. Well appearing and in no acute distress. Eyes: Conjunctivae are normal.  EOMI.  PERRLA. Head: Atraumatic. Nose: No congestion/rhinnorhea. Mouth/Throat: Mucous membranes are moist.   Neck: Normal range of motion.  Cardiovascular: Normal rate, regular rhythm. Good peripheral circulation. Respiratory: Normal respiratory effort.  No retractions.  Gastrointestinal: No distention.  Musculoskeletal: Extremities warm and well perfused.  Neurologic:  Normal speech and language.  Motor and sensory intact in all extremities.  Normal coordination with no ataxia on finger-to-nose.  No pronator drift.  Normal gait.   Skin:  Skin is warm and dry. No rash noted. Psychiatric: Mood and affect are normal. Speech and behavior are normal.  ____________________________________________   LABS (all labs ordered are listed, but only abnormal results are displayed)  Labs Reviewed  BASIC METABOLIC PANEL - Abnormal; Notable for the following components:      Result Value   Glucose, Bld 326 (*)    BUN 21 (*)    All other components within normal limits  CBC - Abnormal; Notable for the  following components:   RBC 5.89 (*)    Hemoglobin 18.4 (*)    All other components within normal limits  URINALYSIS, COMPLETE (UACMP) WITH MICROSCOPIC - Abnormal; Notable for the following components:   Color, Urine YELLOW (*)    APPearance HAZY (*)    Specific Gravity, Urine 1.042 (*)    Glucose, UA >=500 (*)    Ketones, ur 5 (*)    Protein, ur 30 (*)    All other components within normal limits  SARS CORONAVIRUS 2 (TAT 6-24 HRS)  CBG MONITORING, ED   ____________________________________________  EKG  ED ECG REPORT I,  Dayson Aboud, the attending physician, personally viewed and interpreted this ECG.  Date: 01/24/2020 EKG Time: 1237 Rate: 78 Rhythm: normal sinus rhythm QRS Axis: normal Intervals: normal ST/T Wave abnormalities: normal Narrative Interpretation: no evidence of acute ischemia  ____________________________________________  RADIOLOGY    ____________________________________________   PROCEDURES  Procedure(s) performed: No  Procedures  Critical Care performed: No ____________________________________________   INITIAL IMPRESSION / ASSESSMENT AND PLAN / ED COURSE  Pertinent labs & imaging results that were available during my care of the patient were reviewed by me and considered in my medical decision making (see chart for details).  45 year old male with PMH as noted above presents with dizziness described as movement or spinning and starting suddenly yesterday.  It has improved today and has been associated with some ear pain and sore throat.  On exam, the patient is well-appearing.  His vital signs are normal.  Neurologic exam is normal.  Lab work-up was obtained from triage and is within normal limits for the patient except for slightly elevated glucose.  There is no evidence of DKA or other acute diabetic complication.  Overall presentation is consistent with peripheral vertigo.  There is no evidence of CNS cause.  Given the patient's  age and normal neurologic examination, there is no indication for imaging or further work-up.  I will discharge the patient with a prescription for meclizine.  The patient also requested a Covid test given the recent URI symptoms.  This has been ordered, however he may be discharged before it results.  ____________________________________________   FINAL CLINICAL IMPRESSION(S) / ED DIAGNOSES  Final diagnoses:  Peripheral vertigo, unspecified laterality      NEW MEDICATIONS STARTED DURING THIS VISIT:  Discharge Medication List as of 01/24/2020  2:34 PM    START taking these medications   Details  meclizine (ANTIVERT) 50 MG tablet Take 1 tablet (50 mg total) by mouth 3 (three) times daily as needed for dizziness., Starting Tue 01/24/2020, Normal         Note:  This document was prepared using Dragon voice recognition software and may include unintentional dictation errors.   Dionne Bucy, MD 01/24/20 [phone]

## 2020-01-24 NOTE — ED Triage Notes (Signed)
Pt in w/dizziness since yesterday. States his balance has been off as well, feels like room is spinning, and he veers to the left while walking. On assessment, no ataxia or neuro deficits noted. C/o bilateral ear pain since yesterday

## 2020-01-25 LAB — SARS CORONAVIRUS 2 (TAT 6-24 HRS): SARS Coronavirus 2: POSITIVE — AB

## 2020-05-16 ENCOUNTER — Ambulatory Visit: Payer: 59 | Admitting: Adult Health

## 2020-05-16 ENCOUNTER — Telehealth: Payer: Self-pay | Admitting: Adult Health

## 2020-05-16 ENCOUNTER — Other Ambulatory Visit: Payer: Self-pay

## 2020-05-16 ENCOUNTER — Encounter: Payer: Self-pay | Admitting: Adult Health

## 2020-05-16 VITALS — BP 100/58 | HR 86 | Temp 98.2°F | Ht 67.01 in | Wt 177.8 lb

## 2020-05-16 DIAGNOSIS — E785 Hyperlipidemia, unspecified: Secondary | ICD-10-CM | POA: Diagnosis not present

## 2020-05-16 DIAGNOSIS — R1084 Generalized abdominal pain: Secondary | ICD-10-CM

## 2020-05-16 DIAGNOSIS — R634 Abnormal weight loss: Secondary | ICD-10-CM

## 2020-05-16 DIAGNOSIS — N400 Enlarged prostate without lower urinary tract symptoms: Secondary | ICD-10-CM | POA: Insufficient documentation

## 2020-05-16 DIAGNOSIS — K3189 Other diseases of stomach and duodenum: Secondary | ICD-10-CM

## 2020-05-16 DIAGNOSIS — E119 Type 2 diabetes mellitus without complications: Secondary | ICD-10-CM

## 2020-05-16 DIAGNOSIS — Z6827 Body mass index (BMI) 27.0-27.9, adult: Secondary | ICD-10-CM

## 2020-05-16 LAB — COMPREHENSIVE METABOLIC PANEL
ALT: 28 U/L (ref 0–53)
AST: 14 U/L (ref 0–37)
Albumin: 4.2 g/dL (ref 3.5–5.2)
Alkaline Phosphatase: 107 U/L (ref 39–117)
BUN: 15 mg/dL (ref 6–23)
CO2: 24 mEq/L (ref 19–32)
Calcium: 9.1 mg/dL (ref 8.4–10.5)
Chloride: 100 mEq/L (ref 96–112)
Creatinine, Ser: 0.91 mg/dL (ref 0.40–1.50)
GFR: 101.75 mL/min (ref >60.00)
Glucose, Bld: 371 mg/dL — ABNORMAL HIGH (ref 70–99)
Potassium: 4.1 mEq/L (ref 3.5–5.1)
Sodium: 134 mEq/L — ABNORMAL LOW (ref 135–145)
Total Bilirubin: 0.6 mg/dL (ref 0.2–1.2)
Total Protein: 6.9 g/dL (ref 6.0–8.3)

## 2020-05-16 LAB — CBC WITH DIFFERENTIAL/PLATELET
Basophils Absolute: 0 10*3/uL (ref 0.0–0.1)
Basophils Relative: 0.4 % (ref 0.0–3.0)
Eosinophils Absolute: 0.1 10*3/uL (ref 0.0–0.7)
Eosinophils Relative: 0.8 % (ref 0.0–5.0)
HCT: 53.4 % — ABNORMAL HIGH (ref 39.0–52.0)
Hemoglobin: 18.1 g/dL (ref 13.0–17.0)
Lymphocytes Relative: 19.8 % (ref 12.0–46.0)
Lymphs Abs: 2.1 10*3/uL (ref 0.7–4.0)
MCHC: 34 g/dL (ref 30.0–36.0)
MCV: 90.5 fl (ref 78.0–100.0)
Monocytes Absolute: 0.9 10*3/uL (ref 0.1–1.0)
Monocytes Relative: 8.6 % (ref 3.0–12.0)
Neutro Abs: 7.3 10*3/uL (ref 1.4–7.7)
Neutrophils Relative %: 70.4 % (ref 43.0–77.0)
Platelets: 225 10*3/uL (ref 150.0–400.0)
RBC: 5.9 Mil/uL — ABNORMAL HIGH (ref 4.22–5.81)
RDW: 13.6 % (ref 11.5–15.5)
WBC: 10.4 10*3/uL (ref 4.0–10.5)

## 2020-05-16 LAB — LIPID PANEL
Cholesterol: 196 mg/dL (ref 0–200)
HDL: 32.6 mg/dL — ABNORMAL LOW (ref >39.00)
NonHDL: 163.46
Total CHOL/HDL Ratio: 6
Triglycerides: 337 mg/dL — ABNORMAL HIGH (ref 0.0–149.0)
VLDL: 67.4 mg/dL — ABNORMAL HIGH (ref 0.0–40.0)

## 2020-05-16 LAB — AMYLASE: Amylase: 36 U/L (ref 27–131)

## 2020-05-16 LAB — LIPASE: Lipase: 48 U/L (ref 11.0–59.0)

## 2020-05-16 LAB — TSH: TSH: 1.99 u[IU]/mL (ref 0.35–4.50)

## 2020-05-16 LAB — LDL CHOLESTEROL, DIRECT: Direct LDL: 125 mg/dL

## 2020-05-16 LAB — PSA: PSA: 0.6 ng/mL (ref 0.10–4.00)

## 2020-05-16 MED ORDER — METFORMIN HCL 1000 MG PO TABS
ORAL_TABLET | ORAL | 0 refills | Status: DC
Start: 2020-05-16 — End: 2020-08-22

## 2020-05-16 MED ORDER — GLIPIZIDE 5 MG PO TABS: ORAL_TABLET | ORAL | 0 refills | Status: DC

## 2020-05-16 MED ORDER — SIMVASTATIN 40 MG PO TABS: 40.0000 mg | ORAL_TABLET | Freq: Every day | ORAL | 3 refills | Status: DC

## 2020-05-16 MED ORDER — HYDROXYZINE PAMOATE 25 MG PO CAPS: 25.0000 mg | ORAL_CAPSULE | Freq: Three times a day (TID) | ORAL | 0 refills | Status: DC | PRN

## 2020-05-16 NOTE — Patient Instructions (Signed)
Diabetes Mellitus and Nutrition, Adult When you have diabetes, or diabetes mellitus, it is very important to have healthy eating habits because your blood sugar (glucose) levels are greatly affected by what you eat and drink. Eating healthy foods in the right amounts, at about the same times every day, can help you:  Control your blood glucose.  Lower your risk of heart disease.  Improve your blood pressure.  Reach or maintain a healthy weight. What can affect my meal plan? Every person with diabetes is different, and each person has different needs for a meal plan. Your health care provider may recommend that you work with a dietitian to make a meal plan that is best for you. Your meal plan may vary depending on factors such as:  The calories you need.  The medicines you take.  Your weight.  Your blood glucose, blood pressure, and cholesterol levels.  Your activity level.  Other health conditions you have, such as heart or kidney disease. How do carbohydrates affect me? Carbohydrates, also called carbs, affect your blood glucose level more than any other type of food. Eating carbs naturally raises the amount of glucose in your blood. Carb counting is a method for keeping track of how many carbs you eat. Counting carbs is important to keep your blood glucose at a healthy level, especially if you use insulin or take certain oral diabetes medicines. It is important to know how many carbs you can safely have in each meal. This is different for every person. Your dietitian can help you calculate how many carbs you should have at each meal and for each snack. How does alcohol affect me? Alcohol can cause a sudden decrease in blood glucose (hypoglycemia), especially if you use insulin or take certain oral diabetes medicines. Hypoglycemia can be a life-threatening condition. Symptoms of hypoglycemia, such as sleepiness, dizziness, and confusion, are similar to symptoms of having too much  alcohol.  Do not drink alcohol if: ? Your health care provider tells you not to drink. ? You are pregnant, may be pregnant, or are planning to become pregnant.  If you drink alcohol: ? Do not drink on an empty stomach. ? Limit how much you use to:  0-1 drink a day for women.  0-2 drinks a day for men. ? Be aware of how much alcohol is in your drink. In the [place] one drink equals one 12 oz bottle of beer (355 mL), one 5 oz glass of wine (148 mL), or one 1 oz glass of hard liquor (44 mL). ? Keep yourself hydrated with water, diet soda, or unsweetened iced tea.  Keep in mind that regular soda, juice, and other mixers may contain a lot of sugar and must be counted as carbs. What are tips for following this plan? Reading food labels  Start by checking the serving size on the "Nutrition Facts" label of packaged foods and drinks. The amount of calories, carbs, fats, and other nutrients listed on the label is based on one serving of the item. Many items contain more than one serving per package.  Check the total grams (g) of carbs in one serving. You can calculate the number of servings of carbs in one serving by dividing the total carbs by 15. For example, if a food has 30 g of total carbs per serving, it would be equal to 2 servings of carbs.  Check the number of grams (g) of saturated fats and trans fats in one serving. Choose foods that have   a low amount or none of these fats.  Check the number of milligrams (mg) of salt (sodium) in one serving. Most people should limit total sodium intake to less than 2,300 mg per day.  Always check the nutrition information of foods labeled as "low-fat" or "nonfat." These foods may be higher in added sugar or refined carbs and should be avoided.  Talk to your dietitian to identify your daily goals for nutrients listed on the label. Shopping  Avoid buying canned, pre-made, or processed foods. These foods tend to be high in fat, sodium, and added  sugar.  Shop around the outside edge of the grocery store. This is where you will most often find fresh fruits and vegetables, bulk grains, fresh meats, and fresh dairy. Cooking  Use low-heat cooking methods, such as baking, instead of high-heat cooking methods like deep frying.  Cook using healthy oils, such as olive, canola, or sunflower oil.  Avoid cooking with butter, cream, or high-fat meats. Meal planning  Eat meals and snacks regularly, preferably at the same times every day. Avoid going long periods of time without eating.  Eat foods that are high in fiber, such as fresh fruits, vegetables, beans, and whole grains. Talk with your dietitian about how many servings of carbs you can eat at each meal.  Eat 4-6 oz (112-168 g) of lean protein each day, such as lean meat, chicken, fish, eggs, or tofu. One ounce (oz) of lean protein is equal to: ? 1 oz (28 g) of meat, chicken, or fish. ? 1 egg. ?  cup (62 g) of tofu.  Eat some foods each day that contain healthy fats, such as avocado, nuts, seeds, and fish.   What foods should I eat? Fruits Berries. Apples. Oranges. Peaches. Apricots. Plums. Grapes. Mango. Papaya. Pomegranate. Kiwi. Cherries. Vegetables Lettuce. Spinach. Leafy greens, including kale, chard, collard greens, and mustard greens. Beets. Cauliflower. Cabbage. Broccoli. Carrots. Green beans. Tomatoes. Peppers. Onions. Cucumbers. Brussels sprouts. Grains Whole grains, such as whole-wheat or whole-grain bread, crackers, tortillas, cereal, and pasta. Unsweetened oatmeal. Quinoa. Brown or wild rice. Meats and other proteins Seafood. Poultry without skin. Lean cuts of poultry and beef. Tofu. Nuts. Seeds. Dairy Low-fat or fat-free dairy products such as milk, yogurt, and cheese. The items listed above may not be a complete list of foods and beverages you can eat. Contact a dietitian for more information. What foods should I avoid? Fruits Fruits canned with  syrup. Vegetables Canned vegetables. Frozen vegetables with butter or cream sauce. Grains Refined white flour and flour products such as bread, pasta, snack foods, and cereals. Avoid all processed foods. Meats and other proteins Fatty cuts of meat. Poultry with skin. Breaded or fried meats. Processed meat. Avoid saturated fats. Dairy Full-fat yogurt, cheese, or milk. Beverages Sweetened drinks, such as soda or iced tea. The items listed above may not be a complete list of foods and beverages you should avoid. Contact a dietitian for more information. Questions to ask a health care provider  Do I need to meet with a diabetes educator?  Do I need to meet with a dietitian?  What number can I call if I have questions?  When are the best times to check my blood glucose? Where to find more information:  American Diabetes Association: diabetes.org  Academy of Nutrition and Dietetics: www.eatright.org  National Institute of Diabetes and Digestive and Kidney Diseases: www.niddk.nih.gov  Association of Diabetes Care and Education Specialists: www.diabeteseducator.org Summary  It is important to have healthy eating   habits because your blood sugar (glucose) levels are greatly affected by what you eat and drink.  A healthy meal plan will help you control your blood glucose and maintain a healthy lifestyle.  Your health care provider may recommend that you work with a dietitian to make a meal plan that is best for you.  Keep in mind that carbohydrates (carbs) and alcohol have immediate effects on your blood glucose levels. It is important to count carbs and to use alcohol carefully. This information is not intended to replace advice given to you by your health care provider. Make sure you discuss any questions you have with your health care provider. Document Revised: 12/14/2018 Document Reviewed: 12/14/2018 Elsevier Patient Education  2021 [employer]vier Inc.  

## 2020-05-16 NOTE — Telephone Encounter (Signed)
Reviewed labs has note to call.

## 2020-05-16 NOTE — Progress Notes (Signed)
His hemoglobin is still elevated, is he taking any iron supplements or shakes - if so discontinue those ? If he is not, would like him to see hematology for further work up and possible hemochromatosis if he is in agreement.  PSA is within normal limits for prostate.  TSH for thyroid within normal limits.  Glucose very elevated, A1C still pending, he was started back on diabetes medications at office visit today.  Triglycerides are 337 elevated but improved from last labs, will improve likely with improved diabetes control, diet exercise and lifestyle changes advised. HDL good cholesterol is low, the higher this number the better.
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
## 2020-05-16 NOTE — Telephone Encounter (Signed)
CRITICAL VALUE STICKER  CRITICAL VALUE:Hemoglobin 18.1  RECEIVER (on-site recipient of call):Shalimar Mcclain  DATE & TIME NOTIFIED: 05/16/2020 3:00PM  MESSENGER (representative from lab):Karen  MD NOTIFIED: Marcelino Duster Flinchum  TIME OF NOTIFICATION:   RESPONSE: See Note

## 2020-05-17 ENCOUNTER — Telehealth: Payer: Self-pay

## 2020-05-17 LAB — HGB A1C W/O EAG: Hgb A1c MFr Bld: 13.1 % — ABNORMAL HIGH (ref 4.8–5.6)

## 2020-05-17 NOTE — Telephone Encounter (Signed)
Agreed Calvin Butler please see result note regarding a1c f/u Thanks

## 2020-05-17 NOTE — Telephone Encounter (Signed)
-----   Message from Berniece Pap, FNP sent at 05/17/2020  2:07 PM EDT ----- A1C is 13.1, very high, he has been off his diabetes medications and should restart as soon as possible, recheck in the office in one month is advised. Strict dietary and lifestyle changes advised to prevent complications from diabetes. Would recommend endocrinology referral if he is in agreement.

## 2020-05-17 NOTE — Telephone Encounter (Signed)
PCP follow up in one month POCT A1C and sooner follow up if any symptoms worsening.

## 2020-05-17 NOTE — Telephone Encounter (Signed)
Spoke to Schulter and discussed labs and critical. See result note.

## 2020-05-17 NOTE — Telephone Encounter (Signed)
Attempted to contact patient and schedule for repeat labs in one month. Which a1c lab would you like ordered for the patient?

## 2020-05-17 NOTE — Progress Notes (Signed)
A1C is 13.1, very high, he has been off his diabetes medications and should restart as soon as possible, recheck in the office in one month is advised. Strict dietary and lifestyle changes advised to prevent complications from diabetes. Would recommend endocrinology referral if he is in agreement.

## 2020-05-18 ENCOUNTER — Other Ambulatory Visit: Payer: Self-pay

## 2020-05-18 ENCOUNTER — Other Ambulatory Visit: Payer: Self-pay | Admitting: Adult Health

## 2020-05-18 DIAGNOSIS — R7309 Other abnormal glucose: Secondary | ICD-10-CM | POA: Insufficient documentation

## 2020-05-18 MED ORDER — FREESTYLE LIBRE 2 SENSOR MISC: 2.0000 | Freq: Every day | 4 refills | Status: DC

## 2020-05-18 NOTE — Progress Notes (Signed)
Orders Placed This Encounter  Procedures  . Ambulatory referral to Hematology / Oncology

## 2020-05-18 NOTE — Telephone Encounter (Signed)
See result notes. 
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
## 2020-05-24 ENCOUNTER — Encounter: Payer: Self-pay | Admitting: Oncology

## 2020-05-24 ENCOUNTER — Inpatient Hospital Stay: Payer: 59

## 2020-05-24 ENCOUNTER — Ambulatory Visit (INDEPENDENT_AMBULATORY_CARE_PROVIDER_SITE_OTHER): Payer: 59 | Admitting: Urology

## 2020-05-24 ENCOUNTER — Other Ambulatory Visit: Payer: Self-pay

## 2020-05-24 ENCOUNTER — Inpatient Hospital Stay: Payer: 59 | Attending: Oncology | Admitting: Oncology

## 2020-05-24 VITALS — BP 107/65 | HR 82 | Temp 98.9°F | Resp 18 | Wt 181.9 lb

## 2020-05-24 VITALS — BP 106/63 | HR 91 | Ht 67.0 in | Wt 175.0 lb

## 2020-05-24 DIAGNOSIS — E119 Type 2 diabetes mellitus without complications: Secondary | ICD-10-CM

## 2020-05-24 DIAGNOSIS — F17210 Nicotine dependence, cigarettes, uncomplicated: Secondary | ICD-10-CM | POA: Insufficient documentation

## 2020-05-24 DIAGNOSIS — R350 Frequency of micturition: Secondary | ICD-10-CM

## 2020-05-24 DIAGNOSIS — D751 Secondary polycythemia: Secondary | ICD-10-CM

## 2020-05-24 LAB — COMPREHENSIVE METABOLIC PANEL
ALT: 19 U/L (ref 0–44)
AST: 14 U/L — ABNORMAL LOW (ref 15–41)
Albumin: 3.9 g/dL (ref 3.5–5.0)
Alkaline Phosphatase: 86 U/L (ref 38–126)
Anion gap: 11 (ref 5–15)
BUN: 17 mg/dL (ref 6–20)
CO2: 21 mmol/L — ABNORMAL LOW (ref 22–32)
Calcium: 9.7 mg/dL (ref 8.9–10.3)
Chloride: 102 mmol/L (ref 98–111)
Creatinine, Ser: 0.89 mg/dL (ref 0.61–1.24)
GFR, Estimated: >60 mL/min (ref >60)
Glucose, Bld: 333 mg/dL — ABNORMAL HIGH (ref 70–99)
Potassium: 4.5 mmol/L (ref 3.5–5.1)
Sodium: 134 mmol/L — ABNORMAL LOW (ref 135–145)
Total Bilirubin: 0.7 mg/dL (ref 0.3–1.2)
Total Protein: 7.2 g/dL (ref 6.5–8.1)

## 2020-05-24 LAB — CBC WITH DIFFERENTIAL/PLATELET
Abs Immature Granulocytes: 0.05 10*3/uL (ref 0.00–0.07)
Basophils Absolute: 0 10*3/uL (ref 0.0–0.1)
Basophils Relative: 0 %
Eosinophils Absolute: 0.1 10*3/uL (ref 0.0–0.5)
Eosinophils Relative: 1 %
HCT: 49.8 % (ref 39.0–52.0)
Hemoglobin: 17.3 g/dL — ABNORMAL HIGH (ref 13.0–17.0)
Immature Granulocytes: 0 %
Lymphocytes Relative: 23 %
Lymphs Abs: 3 10*3/uL (ref 0.7–4.0)
MCH: 30.5 pg (ref 26.0–34.0)
MCHC: 34.7 g/dL (ref 30.0–36.0)
MCV: 87.8 fL (ref 80.0–100.0)
Monocytes Absolute: 1.1 10*3/uL — ABNORMAL HIGH (ref 0.1–1.0)
Monocytes Relative: 8 %
Neutro Abs: 8.6 10*3/uL — ABNORMAL HIGH (ref 1.7–7.7)
Neutrophils Relative %: 68 %
Platelets: 241 10*3/uL (ref 150–400)
RBC: 5.67 MIL/uL (ref 4.22–5.81)
RDW: 12.3 % (ref 11.5–15.5)
WBC: 12.8 10*3/uL — ABNORMAL HIGH (ref 4.0–10.5)
nRBC: 0 % (ref 0.0–0.2)

## 2020-05-24 LAB — TECHNOLOGIST SMEAR REVIEW
Plt Morphology: NORMAL
RBC MORPHOLOGY: NORMAL
WBC MORPHOLOGY: NORMAL

## 2020-05-24 NOTE — Addendum Note (Signed)
Addended by: Veneta Penton on: 05/24/2020 01:05 PM   Modules accepted: Orders

## 2020-05-24 NOTE — Progress Notes (Signed)
Pt here to establish care. Occasional right flank pain.

## 2020-05-24 NOTE — Progress Notes (Signed)
Hematology/Oncology Consult note [facility] [phone] [phone]   Patient Care Team: Allegra Grana, FNP as PCP - General (Family Medicine)  REFERRING PROVIDER: Stephanie Acre*  CHIEF COMPLAINTS/REASON FOR VISIT:  Evaluation of polycytosis/erythrocytosis  HISTORY OF PRESENTING ILLNESS:  Calvin Butler is a 45 y.o. male who was seen in consultation at the request of Flinchum, Eula Fried, F* for evaluation of polycytosis/erythrocytosis Reviewed patient's recent lab work which was obtain by PCP.  05/16/2020 lab showed elevated hemoglobin at 18.1,  total white count 10.4,, platelet counts 225 Chronic Onset, duration since September 2021. No aggravating or alleviating factors.   Associated signs or symptoms: Denies weight loss, fever, chills, fatigue, night sweats.   Context:  Smoking history: Patient is currently everyday smoker about a pack a day. Testosterone supplements: Denies History of blood clots: Denies Daytime somnolence: Denies Family history of polycythemia: Denies    Review of Systems  Constitutional: Negative for appetite change, chills, fatigue, fever and unexpected weight change.  HENT:   Negative for hearing loss and voice change.   Eyes: Negative for eye problems and icterus.  Respiratory: Negative for chest tightness, cough and shortness of breath.   Cardiovascular: Negative for chest pain and leg swelling.  Gastrointestinal: Negative for abdominal distention and abdominal pain.  Endocrine: Negative for hot flashes.  Genitourinary: Negative for difficulty urinating, dysuria and frequency.   Musculoskeletal: Negative for arthralgias.  Skin: Negative for itching and rash.  Neurological: Negative for light-headedness and numbness.  Hematological: Negative for adenopathy. Does not bruise/bleed easily.  Psychiatric/Behavioral: Negative for confusion.    MEDICAL HISTORY:  Past Medical History:   Diagnosis Date  . Asthma   . Diabetes mellitus without complication (HCC)   . History of GI bleed    secondary to NSAIDs    SURGICAL HISTORY: Past Surgical History:  Procedure Laterality Date  . FACIAL FRACTURE SURGERY  at 45 yrs old   Right side face - due to accident  . FACIAL FRACTURE SURGERY    . PERCUTANEOUS CORONARY STENT INTERVENTION (PCI-S)  2015    SOCIAL HISTORY: Social History   Socioeconomic History  . Marital status: Married    Spouse name: Not on file  . Number of children: 2  . Years of education: Not on file  . Highest education level: Not on file  Occupational History  . Occupation: Gaffer  Tobacco Use  . Smoking status: Current Every Day Smoker    Packs/day: 1.00    Years: 32.00    Pack years: 32.00    Types: Cigarettes  . Smokeless tobacco: Never Used  Vaping Use  . Vaping Use: Never used  Substance and Sexual Activity  . Alcohol use: Yes    Alcohol/week: 0.0 standard drinks    Comment: Occasional  . Drug use: No  . Sexual activity: Not on file  Other Topics Concern  . Not on file  Social History Narrative   Lives in [place] with wife, daughter, sister and 2 children.      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 Aneurysm Mother  Brain  . Diabetes Father   . Asthma Sister   . Heart attack Sister   . Diabetes Maternal Grandmother   . Arthritis Daughter   . Arthritis Daughter   . Prostate cancer Neg Hx     ALLERGIES:  has No Known Allergies.  MEDICATIONS:  Current Outpatient Medications  Medication Sig Dispense Refill  . Continuous Blood Gluc Sensor (FREESTYLE LIBRE 2 SENSOR)  MISC 2 Devices by Does not apply route daily. 2 each 4  . glipiZIDE (GLUCOTROL) 5 MG tablet TAKE 1 TABLET BY MOUTH TWICE DAILY BEFORE MEAL . APPOINTMENT REQUIRED FOR FUTURE REFILLS 60 tablet 0  . glucose blood (ONE TOUCH ULTRA TEST) test strip Use as instructed 100 each 2  . hydrOXYzine (VISTARIL) 25 MG capsule Take 1 capsule (25 mg total) by mouth 3 (three) times daily as needed for anxiety. 30 capsule 0  . Lancets (ONETOUCH ULTRASOFT) lancets Use as instructed 100 each 2  . metFORMIN (GLUCOPHAGE) 1000 MG tablet TAKE 1 TABLET BY MOUTH TWICE DAILY WITH A MEAL 180 tablet 0  . simvastatin (ZOCOR) 40 MG tablet Take 1 tablet (40 mg total) by mouth daily. 90 tablet 3   No current facility-administered medications for this visit.     PHYSICAL EXAMINATION: ECOG PERFORMANCE STATUS: 0 - Asymptomatic Vitals:   05/24/20 1350  BP: 107/65  Pulse: 82  Resp: 18  Temp: 98.9 F (37.2 C)   Filed Weights   05/24/20 1350  Weight: 181 lb 14.4 oz (82.5 kg)    Physical Exam Constitutional:      General: He is not in acute distress. HENT:     Head: Normocephalic and atraumatic.  Eyes:     General: No scleral icterus. Cardiovascular:     Rate and Rhythm: Normal rate and regular rhythm.     Heart sounds: Normal heart sounds.  Pulmonary:     Effort: Pulmonary effort is normal. No respiratory distress.     Breath sounds: No wheezing.  Abdominal:     General: Bowel sounds are normal. There is no distension.     Palpations: Abdomen is soft.  Musculoskeletal:        General: No deformity. Normal range of motion.     Cervical back: Normal range of motion and neck supple.  Skin:    General: Skin is warm and dry.     Findings: No erythema or rash.     Comments: Tottoo on bilateral upper extremity  Neurological:     Mental Status: He is alert and oriented to person, place, and time. Mental status is at baseline.     Cranial Nerves: No cranial nerve deficit.     Coordination: Coordination normal.   Psychiatric:        Mood and Affect: Mood normal.     RADIOGRAPHIC STUDIES: I have personally reviewed the radiological images as listed and agreed with the findings in the report. No results found.   LABORATORY DATA:  I have reviewed the data as listed Lab Results  Component Value Date   WBC 12.8 (H) 05/24/2020   HGB 17.3 (H) 05/24/2020   HCT 49.8 05/24/2020   MCV 87.8 05/24/2020   PLT 241 05/24/2020   Recent Labs    10/05/19 0850 01/24/20 1247 05/16/20 1027 05/24/20 1503  NA 139 136 134* 134*  K 3.5 4.7 4.1 4.5  CL 108 101 100 102  CO2 22 26 24  21*  GLUCOSE 177* 326* 371* 333*  BUN 12 21* 15 17  CREATININE  0.95 1.01 0.91 0.89  CALCIUM 8.9 9.1 9.1 9.7  GFRNONAA >60 >60  --  >60  GFRAA >60  --   --   --   PROT 6.9  --  6.9 7.2  ALBUMIN 3.9  --  4.2 3.9  AST 14*  --  14 14*  ALT 17  --  28 19  ALKPHOS 62  --  107 86  BILITOT 0.5  --  0.6 0.7   Iron/TIBC/Ferritin/ %Sat No results found for: IRON, TIBC, FERRITIN, IRONPCTSAT      ASSESSMENT & PLAN:  1. Erythrocytosis    Labs are reviewed and discussed with patient. Polycythemia (erythrocytosis) is an abnormal elevation of hemoglobin (Hgb) and/or hematocrit (Hct) in peripheral blood, and this can be caused by primary etiology, ie bone marrow mutation, or secondary etiology, ie hypoxia, smoking, androgen supplements, etc.  I will obtain erythropoietin, carbo monoxide level, rule out primary etiology, JAK2 with reflex to other mutations, BCR-ABL.    Orders Placed This Encounter  Procedures  . Comprehensive metabolic panel    Standing Status:   Future    Number of Occurrences:   1    Standing Expiration Date:   05/24/2021  . JAK2 V617F, w Reflex to CALR/E12/MPL    Standing Status:   Future    Number of Occurrences:   1    Standing Expiration Date:   05/24/2021  . BCR-ABL1 FISH    Standing Status:   Future    Number of Occurrences:   1    Standing Expiration Date:   05/24/2021  . Carbon monoxide, blood  (performed at ref lab)    Standing Status:   Future    Number of Occurrences:   1    Standing Expiration Date:   05/24/2021  . Erythropoietin    Standing Status:   Future    Number of Occurrences:   1    Standing Expiration Date:   05/24/2021  . Technologist smear review    Standing Status:   Future    Number of Occurrences:   1    Standing Expiration Date:   05/24/2021    We spent sufficient time to discuss many aspect of care, questions were answered to patient's satisfaction. The patient knows to call the clinic with any problems questions or concerns.  Cc Flinchum, Eula Fried, F*  Return of visit: 2 weeks Thank you for this kind referral and the opportunity to participate in the care of this patient. A copy of today's note is routed to referring provider    Rickard Patience, MD, PhD 05/24/2020

## 2020-05-25 LAB — URINALYSIS, COMPLETE
Bilirubin, UA: NEGATIVE
Ketones, UA: NEGATIVE
Leukocytes,UA: NEGATIVE
Nitrite, UA: NEGATIVE
Protein,UA: NEGATIVE
RBC, UA: NEGATIVE
Specific Gravity, UA: 1.025 (ref 1.005–1.030)
Urobilinogen, Ur: 0.2 mg/dL (ref 0.2–1.0)
pH, UA: 5 (ref 5.0–7.5)

## 2020-05-25 LAB — MICROSCOPIC EXAMINATION
Bacteria, UA: NONE SEEN
RBC, Urine: NONE SEEN /hpf (ref 0–2)

## 2020-05-25 LAB — CARBON MONOXIDE, BLOOD (PERFORMED AT REF LAB): Carbon Monoxide, Blood: 6.7 % — ABNORMAL HIGH (ref 0.0–3.6)

## 2020-05-25 LAB — ERYTHROPOIETIN: Erythropoietin: 7 m[IU]/mL (ref 2.6–18.5)

## 2020-05-29 LAB — BCR-ABL1 FISH
Cells Analyzed: 200
Cells Counted: 200

## 2020-06-06 ENCOUNTER — Telehealth: Payer: 59 | Admitting: Oncology

## 2020-06-06 ENCOUNTER — Ambulatory Visit: Payer: 59 | Admitting: Family

## 2020-06-07 ENCOUNTER — Other Ambulatory Visit: Payer: Self-pay

## 2020-06-07 ENCOUNTER — Inpatient Hospital Stay (HOSPITAL_BASED_OUTPATIENT_CLINIC_OR_DEPARTMENT_OTHER): Payer: 59 | Admitting: Oncology

## 2020-06-07 DIAGNOSIS — E1365 Other specified diabetes mellitus with hyperglycemia: Secondary | ICD-10-CM

## 2020-06-07 DIAGNOSIS — D751 Secondary polycythemia: Secondary | ICD-10-CM | POA: Diagnosis not present

## 2020-06-07 DIAGNOSIS — Z720 Tobacco use: Secondary | ICD-10-CM

## 2020-06-08 LAB — JAK2 V617F, W REFLEX TO CALR/E12/MPL

## 2020-06-08 LAB — CALR + JAK2 E12-15 + MPL (REFLEXED)

## 2020-06-12 ENCOUNTER — Encounter: Payer: Self-pay | Admitting: Oncology

## 2020-06-12 NOTE — Progress Notes (Signed)
HEMATOLOGY-ONCOLOGY TeleHEALTH VISIT PROGRESS NOTE  I connected with Calvin Butler on 06/12/20  at  3:00 PM EDT by video enabled telemedicine visit and verified that I am speaking with the correct person using two identifiers. I discussed the limitations, risks, security and privacy concerns of performing an evaluation and management service by telemedicine and the availability of in-person appointments. The patient expressed understanding and agreed to proceed.   Other persons participating in the visit and their role in the encounter:  None  Patient's location: Home  Provider's location: office Chief Complaint: erythrocytosis.    INTERVAL HISTORY Calvin Butler is a 45 y.o. male who has above history reviewed by me today presents for follow up visit for erythrocytosis. He had blood work done after last visit and presents to discuss lab results. No new complaints.   Review of Systems  Constitutional: Negative for appetite change, chills, fatigue, fever and unexpected weight change.  HENT:   Negative for hearing loss and voice change.   Eyes: Negative for eye problems and icterus.  Respiratory: Negative for chest tightness, cough and shortness of breath.   Cardiovascular: Negative for chest pain and leg swelling.  Gastrointestinal: Negative for abdominal distention and abdominal pain.  Endocrine: Negative for hot flashes.  Genitourinary: Negative for difficulty urinating, dysuria and frequency.   Musculoskeletal: Negative for arthralgias.  Skin: Negative for itching and rash.  Neurological: Negative for light-headedness and numbness.  Hematological: Negative for adenopathy. Does not bruise/bleed easily.  Psychiatric/Behavioral: Negative for confusion.    Past Medical History:  Diagnosis Date  . Asthma   . Diabetes mellitus without complication (HCC)   . History of GI bleed    secondary to NSAIDs   Past Surgical History:  Procedure Laterality Date  . FACIAL FRACTURE  SURGERY  at 45 yrs old   Right side face - due to accident  . FACIAL FRACTURE SURGERY    . PERCUTANEOUS CORONARY STENT INTERVENTION (PCI-S)  2015    Family History  Problem Relation Age of Onset  . Aneurysm Mother        Brain  . Diabetes Father   . Asthma Sister   . Heart attack Sister   . Diabetes Maternal Grandmother   . Arthritis Daughter   . Arthritis Daughter   . Prostate cancer Neg Hx     Social History   Socioeconomic History  . Marital status: Married    Spouse name: Not on file  . Number of children: 2  . Years of education: Not on file  . Highest education level: Not on file  Occupational History  . Occupation: Gaffer  Tobacco Use  . Smoking status: Current Every Day Smoker    Packs/day: 1.00    Years: 32.00    Pack years: 32.00    Types: Cigarettes  . Smokeless tobacco: Never Used  Vaping Use  . Vaping Use: Never used  Substance and Sexual Activity  . Alcohol use: Yes    Alcohol/week: 0.0 standard drinks    Comment: Occasional  . Drug use: No  . Sexual activity: Not on file  Other Topics Concern  . Not on file  Social History Narrative   Lives in [place] with wife, daughter, sister and 2 children.      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 Continuous Blood Gluc Sensor (FREESTYLE LIBRE 2 SENSOR) MISC 2 Devices by Does not apply route daily. 2 each 4  . glipiZIDE (GLUCOTROL) 5 MG tablet TAKE 1 TABLET BY MOUTH TWICE DAILY BEFORE MEAL . APPOINTMENT REQUIRED FOR FUTURE REFILLS 60 tablet 0  . glucose blood (ONE TOUCH ULTRA TEST) test strip Use as instructed 100 each 2  . hydrOXYzine  (VISTARIL) 25 MG capsule Take 1 capsule (25 mg total) by mouth 3 (three) times daily as needed for anxiety. 30 capsule 0  . Lancets (ONETOUCH ULTRASOFT) lancets Use as instructed 100 each 2  . metFORMIN (GLUCOPHAGE) 1000 MG tablet TAKE 1 TABLET BY MOUTH TWICE DAILY WITH A MEAL 180 tablet 0  . simvastatin (ZOCOR) 40 MG tablet Take 1 tablet (40 mg total) by mouth daily. 90 tablet 3   No current facility-administered medications on file prior to visit.    No Known Allergies     Observations/Objective: Today's Vitals   06/07/20 1402  PainSc: 0-No pain   There is no height or weight on file to calculate BMI.  Physical Exam Neurological:     Mental Status: He is alert.     CBC    Component Value Date/Time   WBC 12.8 (H) 05/24/2020 1514   RBC 5.67 05/24/2020 1514   HGB 17.3 (H) 05/24/2020 1514   HGB 16.2 12/09/2013 1930   HCT 49.8 05/24/2020 1514   HCT 47.8 12/09/2013 1930   PLT 241 05/24/2020 1514   PLT 210 12/09/2013 1930   MCV 87.8 05/24/2020 1514   MCV 93 12/09/2013 1930   MCH 30.5 05/24/2020 1514   MCHC 34.7 05/24/2020 1514   RDW 12.3 05/24/2020 1514   RDW 13.7 12/09/2013 1930   LYMPHSABS 3.0 05/24/2020 1514   LYMPHSABS 3.1 12/09/2013 1930   MONOABS 1.1 (H) 05/24/2020 1514   MONOABS 1.3 (H) 12/09/2013 1930   EOSABS 0.1 05/24/2020 1514   EOSABS 0.2 12/09/2013 1930   BASOSABS 0.0 05/24/2020 1514   BASOSABS 0.1 12/09/2013 1930    CMP     Component Value Date/Time   NA 134 (L) 05/24/2020 1503   NA 142 12/09/2013 1930   K 4.5 05/24/2020 1503   K 4.0 12/09/2013 1930   CL 102 05/24/2020 1503   CL 110 (H) 12/09/2013 1930   CO2 21 (L) 05/24/2020 1503   CO2 25 12/09/2013 1930   GLUCOSE 333 (H) 05/24/2020 1503   GLUCOSE 143 (H) 12/09/2013 1930   BUN 17 05/24/2020 1503   BUN 26 (H) 12/09/2013 1930   CREATININE 0.89 05/24/2020 1503   CREATININE 1.41 (H) 11/17/2014 1632   CALCIUM 9.7 05/24/2020 1503   CALCIUM 7.9 (L) 12/09/2013 1930   PROT 7.2 05/24/2020 1503    ALBUMIN 3.9 05/24/2020 1503   AST 14 (L) 05/24/2020 1503   ALT 19 05/24/2020 1503   ALKPHOS 86 05/24/2020 1503   BILITOT 0.7 05/24/2020 1503   GFRNONAA >60 05/24/2020 1503   GFRNONAA >60 12/09/2013 1930   GFRAA >60 10/05/2019 0850   GFRAA >60 12/09/2013 1930     Assessment and Plan: 1. Secondary erythrocytosis   2. Tobacco use   3. Uncontrolled other specified diabetes mellitus with hyperglycemia (HCC)     Labs reviewed and discussed with patient. JAK2 V617F mutation negative, with reflex  to other mutations CALR, MPL, JAK 2 Ex 12-15 mutations negative. BCR ABL1 FISH negative. Normal erythropoietin level, Increased carbon monoxide level at 6.7 Hemoglobin 17.3, HCT 49.8 this is most likely secondary erythrocytosis due to smoking. Phlebotomy if hematocrit is above 50. Discussed with patient about smoke cessation and he is motivated. I plan to see me in 3 months with repeat blood work and reevaluation of need of phlebotomy.  Leukocytosis, predominantly neutrophilia, slight monocytosis Likely secondary to smoking.  Uncontrolled diabetes, glucose level is high in the 300s.  Recommend patient to continue follow-up with primary care provider for optimization of glycemic control.  Follow Up Instructions: 3 months   I discussed the assessment and treatment plan with the patient. The patient was provided an opportunity to ask questions and all were answered. The patient agreed with the plan and demonstrated an understanding of the instructions.  The patient was advised to call back or seek an in-person evaluation if the symptoms worsen or if the condition fails to improve as anticipated.    Rickard Patience, MD 06/12/2020 2:06 PM

## 2020-06-13 ENCOUNTER — Ambulatory Visit (INDEPENDENT_AMBULATORY_CARE_PROVIDER_SITE_OTHER): Payer: 59 | Admitting: Family

## 2020-06-13 ENCOUNTER — Encounter: Payer: Self-pay | Admitting: Family

## 2020-06-13 ENCOUNTER — Other Ambulatory Visit: Payer: Self-pay

## 2020-06-13 VITALS — BP 122/64 | HR 75 | Temp 98.1°F | Ht 67.0 in | Wt 186.0 lb

## 2020-06-13 DIAGNOSIS — F32A Depression, unspecified: Secondary | ICD-10-CM

## 2020-06-13 DIAGNOSIS — E119 Type 2 diabetes mellitus without complications: Secondary | ICD-10-CM | POA: Diagnosis not present

## 2020-06-13 DIAGNOSIS — F419 Anxiety disorder, unspecified: Secondary | ICD-10-CM | POA: Diagnosis not present

## 2020-06-13 DIAGNOSIS — D751 Secondary polycythemia: Secondary | ICD-10-CM

## 2020-06-13 MED ORDER — OZEMPIC (0.25 OR 0.5 MG/DOSE) 2 MG/1.5ML ~~LOC~~ SOPN
0.2500 mg | PEN_INJECTOR | SUBCUTANEOUS | 1 refills | Status: DC
Start: 2020-06-13 — End: 2020-07-05

## 2020-06-13 MED ORDER — SERTRALINE HCL 50 MG PO TABS: 50.0000 mg | ORAL_TABLET | Freq: Every day | ORAL | 3 refills | Status: DC

## 2020-06-13 NOTE — Patient Instructions (Addendum)
Please download the Calm App.   Start zoloft, ozempic  Please let me know if you need anything at all  Our hope is for gradual improvement of mood since starting medication; however this may take several weeks.   If you start to have unusual thoughts, thoughts of hurting yourself, or anyone else, please go immediately to the emergency department.   Follow up in 6 weeks.   Please text to [phone] and write the word 'home'. This will put you in touch with trained crisis counselor and resources.    National Suicide Prevention Hotline - available 24 hours a day, 7 days a week.  9364690531  Major Depressive Disorder Major depressive disorder is a mental illness. It also may be called clinical depression or unipolar depression. Major depressive disorder usually causes feelings of sadness, hopelessness, or helplessness. Some people with this disorder do not feel particularly sad but lose interest in doing things they used to enjoy (anhedonia). Major depressive disorder also can cause physical symptoms. It can interfere with work, school, relationships, and other normal everyday activities. The disorder varies in severity but is longer lasting and more serious than the sadness we all feel from time to time in our lives. Major depressive disorder often is triggered by stressful life events or major life changes. Examples of these triggers include divorce, loss of your job or home, a move, and the death of a family member or close friend. Sometimes this disorder occurs for no obvious reason at all. People who have family members with major depressive disorder or bipolar disorder are at higher risk for developing this disorder, with or without life stressors. Major depressive disorder can occur at any age. It may occur just once in your life (single episode major depressive disorder). It may occur multiple times (recurrent major depressive disorder). SYMPTOMS People with major depressive disorder have  either anhedonia or depressed mood on nearly a daily basis for at least 2 weeks or longer. Symptoms of depressed mood include:  Feelings of sadness (blue or down in the dumps) or emptiness.  Feelings of hopelessness or helplessness.  Tearfulness or episodes of crying (may be observed by others).  Irritability (children and adolescents). In addition to depressed mood or anhedonia or both, people with this disorder have at least four of the following symptoms:  Difficulty sleeping or sleeping too much.    Significant change (increase or decrease) in appetite or weight.    Lack of energy or motivation.  Feelings of guilt and worthlessness.    Difficulty concentrating, remembering, or making decisions.  Unusually slow movement (psychomotor retardation) or restlessness (as observed by others).    Recurrent wishes for death, recurrent thoughts of self-harm (suicide), or a suicide attempt. People with major depressive disorder commonly have persistent negative thoughts about themselves, other people, and the world. People with severe major depressive disorder may experience distorted beliefs or perceptions about the world (psychotic delusions). They also may see or hear things that are not real (psychotic hallucinations). DIAGNOSIS Major depressive disorder is diagnosed through an assessment by your health care provider. Your health care provider will ask about aspects of your daily life, such as mood, sleep, and appetite, to see if you have the diagnostic symptoms of major depressive disorder. Your health care provider may ask about your medical history and use of alcohol or drugs, including prescription medicines. Your health care provider also may do a physical exam and blood work. This is because certain medical conditions and the use  of certain substances can cause major depressive disorder-like symptoms (secondary depression). Your health care provider also may refer you to a mental health  specialist for further evaluation and treatment. TREATMENT It is important to recognize the symptoms of major depressive disorder and seek treatment. The following treatments can be prescribed for this disorder:    Medicine. Antidepressant medicines usually are prescribed. Antidepressant medicines are thought to correct chemical imbalances in the brain that are commonly associated with major depressive disorder. Other types of medicine may be added if the symptoms do not respond to antidepressant medicines alone or if psychotic delusions or hallucinations occur.  Talk therapy. Talk therapy can be helpful in treating major depressive disorder by providing support, education, and guidance. Certain types of talk therapy also can help with negative thinking (cognitive behavioral therapy) and with relationship issues that trigger this disorder (interpersonal therapy). A mental health specialist can help determine which treatment is best for you. Most people with major depressive disorder do well with a combination of medicine and talk therapy. Treatments involving electrical stimulation of the brain can be used in situations with extremely severe symptoms or when medicine and talk therapy do not work over time. These treatments include electroconvulsive therapy, transcranial magnetic stimulation, and vagal nerve stimulation.   This information is not intended to replace advice given to you by your health care provider. Make sure you discuss any questions you have with your health care provider.   Document Released: 05/03/2012 Document Revised: 01/27/2014 Document Reviewed: 05/03/2012 Elsevier Interactive Patient Education [employer]

## 2020-06-13 NOTE — Progress Notes (Signed)
Subjective:    Patient ID: Calvin Butler, male    DOB: 09-Jul-1975, 45 y.o.   MRN: [id number]  CC: Calvin Butler is a 45 y.o. male who presents today for follow up.   HPI: Worsening depression and anxiety, x 8 months.   Wife left him 8 months ago . This was complicated due her h/o substance abuse. She came back to him and then recently left him one month ago.  He has loss  of appetite. He still he loves her and wants to make relationship work. She has left him twice before and he keeps waiting for her to return.   He presented to ED 6 months ago after attempted to hurt himself by stepping into traffic. He describes that he doesn't want 'to kill himself' but he is in pain from loosing wife. He has passive thoughts of not being here.  He is excited about a new motorcycle which he has just purchased. Describes supportive friends.  He denies drug use. He will drink 5 beers on the weekend.   He denies suicide plan. Denies HI.   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 He is excited about this. Affording medication has been difficult for him.   To see a therapist costs $100 so he has not pursued. [facility] 10/05/19 He tried zoloft 25mg , trazodone 50mg  prn, atarax 25mg  tid. He reports that he didn't feel any difference but was compliant.   DM- compliant metformin 1000mg  bid, glipizide 5mg  BID.  FBG 200. An hour he eats, BG may be 300.  Eats once or twice per day such as [employer] hamburger or chicken salad salad.   Drinks only water. He rarely checks his blood sugar.  Urinary has resolved. Reports vision has been less clear bilaterally over the past few months, no acute changes. NO vision loss, HA. No personal or family history thyroid cancer.     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 Plan to follow up in 3 months     HISTORY:  Past Medical History:  Diagnosis Date  . Asthma   . Diabetes  mellitus without complication (HCC)   . History of GI bleed    secondary to NSAIDs   Past Surgical History:  Procedure Laterality Date  . FACIAL FRACTURE SURGERY  at 45 yrs old   Right side face - due to accident  . FACIAL FRACTURE SURGERY    . PERCUTANEOUS CORONARY STENT INTERVENTION (PCI-S)  2015   Family History  Problem Relation Age of Onset  . Aneurysm Mother        Brain  . Diabetes Father   . Asthma Sister   . Heart attack Sister   . Diabetes Maternal Grandmother   . Arthritis Daughter   . Arthritis Daughter   . Prostate cancer Neg Hx   . Thyroid cancer Neg Hx     Allergies: Patient has no known allergies. Current Outpatient Medications on File Prior to Visit  Medication Sig Dispense Refill  . glipiZIDE (GLUCOTROL) 5 MG tablet TAKE 1 TABLET BY MOUTH TWICE DAILY BEFORE MEAL . APPOINTMENT REQUIRED FOR FUTURE REFILLS 60 tablet 0  . glucose blood (ONE TOUCH ULTRA TEST) test strip Use as instructed 100 each 2  . hydrOXYzine (VISTARIL) 25 MG capsule Take 1 capsule (25 mg total) by mouth 3 (three) times daily as needed for anxiety. 30 capsule 0  . Lancets (ONETOUCH ULTRASOFT) lancets Use  as instructed 100 each 2  . metFORMIN (GLUCOPHAGE) 1000 MG tablet TAKE 1 TABLET BY MOUTH TWICE DAILY WITH A MEAL 180 tablet 0  . simvastatin (ZOCOR) 40 MG tablet Take 1 tablet (40 mg total) by mouth daily. 90 tablet 3  . Continuous Blood Gluc Sensor (FREESTYLE LIBRE 2 SENSOR) MISC 2 Devices by Does not apply route daily. (Patient not taking: Reported on 06/13/2020) 2 each 4   No current facility-administered medications on file prior to visit.    Social History   Tobacco Use  . Smoking status: Current Every Day Smoker    Packs/day: 1.00    Years: 32.00    Pack years: 32.00    Types: Cigarettes  . Smokeless tobacco: Never Used  Vaping Use  . Vaping Use: Never used  Substance Use Topics  . Alcohol use: Yes    Alcohol/week: 0.0 standard drinks    Comment: Occasional  . Drug use: No     Review of Systems  Constitutional: Negative for chills and fever.  Eyes: Positive for visual disturbance.  Respiratory: Negative for cough.   Cardiovascular: Negative for chest pain and palpitations.  Gastrointestinal: Negative for nausea and vomiting.  Genitourinary: Negative for difficulty urinating and frequency (resolved).  Psychiatric/Behavioral: Positive for sleep disturbance. Negative for suicidal ideas. The patient is nervous/anxious.       Objective:    BP 122/64 (BP Location: Left Arm, Patient Position: Sitting, Cuff Size: Large)   Pulse 75   Temp 98.1 F (36.7 C) (Oral)   Ht 5\' 7"  (1.702 m)   Wt 186 lb (84.4 kg)   SpO2 96%   BMI 29.13 kg/m  BP Readings from Last 3 Encounters:  06/13/20 122/64  05/24/20 107/65  05/24/20 106/63   Wt Readings from Last 3 Encounters:  06/13/20 186 lb (84.4 kg)  05/24/20 181 lb 14.4 oz (82.5 kg)  05/24/20 175 lb (79.4 kg)    Physical Exam Vitals reviewed.  Constitutional:      Appearance: He is well-developed.  Cardiovascular:     Rate and Rhythm: Regular rhythm.     Heart sounds: Normal heart sounds.  Pulmonary:     Effort: Pulmonary effort is normal. No respiratory distress.     Breath sounds: Normal breath sounds. No wheezing, rhonchi or rales.  Skin:    General: Skin is warm and dry.  Neurological:     Mental Status: He is alert.  Psychiatric:        Speech: Speech normal.        Behavior: Behavior normal.        Assessment & Plan:   Problem List Items Addressed This Visit      Endocrine   Diabetes (HCC) - Primary (Chronic)    Severely uncontrolled. Continue metformin 1000mg  bid, glipizide 5mg  BID. Start ozempic 0.25mg . Counseled on black box warning regarding medullary thyroid cancer. He doesn't routinely check blood sugar and he desires to loose weight so we agreed not to start insulin therapy. I have provided him with sample for ozempic to start as concerned for cost. Will discuss medication assistance  program with him at follow up.       Relevant Medications   Semaglutide,0.25 or 0.5MG /DOS, (OZEMPIC, 0.25 OR 0.5 MG/DOSE,) 2 MG/1.5ML SOPN   Other Relevant Orders   Ambulatory referral to Ophthalmology   Microalbumin / creatinine urine ratio (Completed)     Other   Anxiety and depression    Uncontrolled. We had a long discussion about how much he  cares for his wife whom has left him. Unfortunately due to cost,  counseling is not able to be pursued however we will continue to discuss other options such as seeing me more frequently and he will look into the Calm App. He has no suicide plan or prior attempt. He describes passively not wanting to live anymore however he continues to explain that he would not harm himself or anyone else. We agreed that started zoloft at 50mg  is very appropriate and will titrate at follow up in 2 weeks. Advised to call 911 with any thoughts of self harm, suicide or harming someone else.       Relevant Medications   sertraline (ZOLOFT) 50 MG tablet   Erythrocytosis       I am having Calvin Butler start on sertraline and Ozempic (0.25 or 0.5 MG/DOSE). I am also having him maintain his onetouch ultrasoft, glucose blood, metFORMIN, glipiZIDE, hydrOXYzine, simvastatin, and FreeStyle Libre 2 Sensor.   Meds ordered this encounter  Medications  . sertraline (ZOLOFT) 50 MG tablet    Sig: Take 1 tablet (50 mg total) by mouth at bedtime.    Dispense:  90 tablet    Refill:  3    Order Specific Question:   Supervising Provider    Answer:   L [[id number]]  . Semaglutide,0.25 or 0.5MG /DOS, (OZEMPIC, 0.25 OR 0.5 MG/DOSE,) 2 MG/1.5ML SOPN    Sig: Inject 0.25 mg into the skin once a week. After one month increase to 0.5mg  into the skin once per week.    Dispense:  1.5 mL    Refill:  1    Order Specific Question:   Supervising Provider    Answer:   Duncan Dull [[id number]]    Return precautions given.   Risks, benefits, and alternatives of the medications  and treatment plan prescribed today were discussed, and patient expressed understanding.   Education regarding symptom management and diagnosis given to patient on AVS.  Continue to follow with Sherlene Shams, FNP for routine health maintenance.   Allegra Grana and I agreed with plan.   Rosanna Randy, FNP

## 2020-06-14 LAB — MICROALBUMIN / CREATININE URINE RATIO
Creatinine,U: 68.9 mg/dL
Microalb Creat Ratio: 1 mg/g (ref 0.0–30.0)
Microalb, Ur: <0.7 mg/dL (ref 0.0–1.9)

## 2020-06-14 NOTE — Assessment & Plan Note (Signed)
Severely uncontrolled. Continue metformin 1000mg  bid, glipizide 5mg  BID. Start ozempic 0.25mg . Counseled on black box warning regarding medullary thyroid cancer. He doesn't routinely check blood sugar and he desires to loose weight so we agreed not to start insulin therapy. I have provided him with sample for ozempic to start as concerned for cost. Will discuss medication assistance program with him at follow up.

## 2020-06-14 NOTE — Assessment & Plan Note (Signed)
Uncontrolled. We had a long discussion about how much he cares for his wife whom has left him. Unfortunately due to cost,  counseling is not able to be pursued however we will continue to discuss other options such as seeing me more frequently and he will look into the [employer] App. He has no suicide plan or prior attempt. He describes passively not wanting to live anymore however he continues to explain that he would not harm himself or anyone else. We agreed that started zoloft at 50mg  is very appropriate and will titrate at follow up in 2 weeks. Advised to call 911 with any thoughts of self harm, suicide or harming someone else.

## 2020-06-25 ENCOUNTER — Telehealth: Payer: Self-pay | Admitting: Family

## 2020-06-25 NOTE — Telephone Encounter (Signed)
LMTCB

## 2020-06-25 NOTE — Telephone Encounter (Signed)
Can we move him to Tuesday 6/7 at 930 or lunch?   Or even the following week?  How are fasting and post prandial blood sugars?   He should be on metformin 1000mg  bid, glipizide 5mg  BID; and we started ozempic 0.25mg .     Then block 11am on 6/8 please

## 2020-06-26 NOTE — Telephone Encounter (Signed)
Done

## 2020-06-26 NOTE — Telephone Encounter (Signed)
Please ensure 6/8 11 am slot is BLOCKED as well

## 2020-06-26 NOTE — Telephone Encounter (Signed)
Patient was able to come 07/06/20 at 9:30. I will cancel tomorrows appointment. I have blocked the 11:30 hospital f/u for you.

## 2020-06-27 ENCOUNTER — Ambulatory Visit: Payer: 59 | Admitting: Family

## 2020-07-05 ENCOUNTER — Other Ambulatory Visit: Payer: Self-pay

## 2020-07-05 ENCOUNTER — Encounter: Payer: Self-pay | Admitting: Family

## 2020-07-05 ENCOUNTER — Ambulatory Visit: Payer: 59 | Admitting: Family

## 2020-07-05 DIAGNOSIS — E785 Hyperlipidemia, unspecified: Secondary | ICD-10-CM | POA: Diagnosis not present

## 2020-07-05 DIAGNOSIS — E119 Type 2 diabetes mellitus without complications: Secondary | ICD-10-CM | POA: Diagnosis not present

## 2020-07-05 DIAGNOSIS — F419 Anxiety disorder, unspecified: Secondary | ICD-10-CM | POA: Diagnosis not present

## 2020-07-05 DIAGNOSIS — F32A Depression, unspecified: Secondary | ICD-10-CM

## 2020-07-05 MED ORDER — GLIPIZIDE 5 MG PO TABS: 2.5000 mg | ORAL_TABLET | Freq: Every day | ORAL | 1 refills | Status: DC

## 2020-07-05 MED ORDER — SERTRALINE HCL 100 MG PO TABS: 100.0000 mg | ORAL_TABLET | Freq: Every day | ORAL | 3 refills | Status: DC

## 2020-07-05 MED ORDER — OZEMPIC (0.25 OR 0.5 MG/DOSE) 2 MG/1.5ML ~~LOC~~ SOPN: 0.2500 mg | PEN_INJECTOR | SUBCUTANEOUS | 1 refills | Status: DC

## 2020-07-05 NOTE — Progress Notes (Signed)
Subjective:    Patient ID: Calvin Butler, male    DOB: 12-24-1975, 45 y.o.   MRN: [id number]  CC: Calvin Butler is a 45 y.o. male who presents today for follow up.   HPI: Feels well today  No new complaints  DM-FGB 180, 230 this past week when not on glipizide.  Compliant with metformin 1000mg  bid, ozempic 0.25 and plans to start 0.5mg  this week.  He has stopped glipizide as ran out of medication; when he had been on glipizide FBG 70. He didn't feel symptomatic for low blood sugar. He doesn't eat after taking glipizide. Dinner is biggest meal however he is working eating breakfast more consistently.   Hadnt had lows since stopping glipizide.  Endorses constipation since starting ozempic.  BM this morning. Harder more difficult to pass. Eating a lot of carbs. Drinks only water.   Depression and anxiety - compliant with zoloft 50mg  and sleeping better and depression improved.   HLD- compliant with zocor      HISTORY:  Past Medical History:  Diagnosis Date   Asthma    Diabetes mellitus without complication (HCC)    History of GI bleed    secondary to NSAIDs   Past Surgical History:  Procedure Laterality Date   FACIAL FRACTURE SURGERY  at 45 yrs old   Right side face - due to accident   FACIAL FRACTURE SURGERY     PERCUTANEOUS CORONARY STENT INTERVENTION (PCI-S)  2015   Family History  Problem Relation Age of Onset   Aneurysm Mother        Brain   Diabetes Father    Asthma Sister    Heart attack Sister    Diabetes Maternal Grandmother    Arthritis Daughter    Arthritis Daughter    Prostate cancer Neg Hx    Thyroid cancer Neg Hx     Allergies: Patient has no known allergies. Current Outpatient Medications on File Prior to Visit  Medication Sig Dispense Refill   glucose blood (ONE TOUCH ULTRA TEST) test strip Use as instructed 100 each 2   hydrOXYzine (VISTARIL) 25 MG capsule Take 1 capsule (25 mg total) by mouth 3 (three) times daily as needed for anxiety.  30 capsule 0   Lancets (ONETOUCH ULTRASOFT) lancets Use as instructed 100 each 2   metFORMIN (GLUCOPHAGE) 1000 MG tablet TAKE 1 TABLET BY MOUTH TWICE DAILY WITH A MEAL 180 tablet 0   simvastatin (ZOCOR) 40 MG tablet Take 1 tablet (40 mg total) by mouth daily. 90 tablet 3   Continuous Blood Gluc Sensor (FREESTYLE LIBRE 2 SENSOR) MISC 2 Devices by Does not apply route daily. (Patient not taking: Reported on 07/05/2020) 2 each 4   No current facility-administered medications on file prior to visit.    Social History   Tobacco Use   Smoking status: Every Day    Packs/day: 1.00    Years: 32.00    Pack years: 32.00    Types: Cigarettes   Smokeless tobacco: Never  Vaping Use   Vaping Use: Never used  Substance Use Topics   Alcohol use: Yes    Alcohol/week: 0.0 standard drinks    Comment: Occasional   Drug use: No    Review of Systems  Constitutional:  Negative for chills and fever.  Respiratory:  Negative for cough.   Cardiovascular:  Negative for chest pain and palpitations.  Gastrointestinal:  Negative for nausea and vomiting.  Psychiatric/Behavioral:  Negative for sleep disturbance (improved). The patient is  nervous/anxious.      Objective:    BP 110/68 (BP Location: Left Arm, Patient Position: Sitting, Cuff Size: Large)   Pulse 87   Temp 98.3 F (36.8 C) (Oral)   Ht 5\' 7"  (1.702 m)   Wt 181 lb 12.8 oz (82.5 kg)   SpO2 96%   BMI 28.47 kg/m  BP Readings from Last 3 Encounters:  07/05/20 110/68  06/13/20 122/64  05/24/20 107/65   Wt Readings from Last 3 Encounters:  07/05/20 181 lb 12.8 oz (82.5 kg)  06/13/20 186 lb (84.4 kg)  05/24/20 181 lb 14.4 oz (82.5 kg)    Physical Exam Vitals reviewed.  Constitutional:      Appearance: He is well-developed.  Cardiovascular:     Rate and Rhythm: Regular rhythm.     Heart sounds: Normal heart sounds.  Pulmonary:     Effort: Pulmonary effort is normal. No respiratory distress.     Breath sounds: Normal breath sounds. No  wheezing, rhonchi or rales.  Skin:    General: Skin is warm and dry.  Neurological:     Mental Status: He is alert.  Psychiatric:        Speech: Speech normal.        Behavior: Behavior normal.       Assessment & Plan:   Problem List Items Addressed This Visit       Endocrine   Diabetes mellitus without complication (HCC)    Improved. Experiencing constipation likely from ozempic. Continue ozempic 0.25mg  for now and not increase to 0.5mg . If doesn't resolve, patient will let me know and we will likely start jardiance or increase glipizide. Resume glipizide 2.5mg  and counseled on taking prior to meal. Continue metformin 1000mg  bid.        Relevant Medications   Semaglutide,0.25 or 0.5MG /DOS, (OZEMPIC, 0.25 OR 0.5 MG/DOSE,) 2 MG/1.5ML SOPN   glipiZIDE (GLUCOTROL) 5 MG tablet     Other   Anxiety and depression    Improved, uncontrolled. Increase zoloft to 100mg .        Relevant Medications   sertraline (ZOLOFT) 100 MG tablet   Hyperlipidemia    Presume improved. Continue zocor 40mg  and will check lipid panel in a couple of months.          I have discontinued 07/24/20. Ybarra's sertraline. I have also changed his Ozempic (0.25 or 0.5 MG/DOSE) and glipiZIDE. Additionally, I am having him start on sertraline. Lastly, I am having him maintain his onetouch ultrasoft, glucose blood, metFORMIN, hydrOXYzine, simvastatin, and FreeStyle Libre 2 Sensor.   Meds ordered this encounter  Medications   sertraline (ZOLOFT) 100 MG tablet    Sig: Take 1 tablet (100 mg total) by mouth daily.    Dispense:  90 tablet    Refill:  3    Order Specific Question:   Supervising Provider    Answer:   L [[id number]]   Semaglutide,0.25 or 0.5MG /DOS, (OZEMPIC, 0.25 OR 0.5 MG/DOSE,) 2 MG/1.5ML SOPN    Sig: Inject 0.25 mg into the skin once a week.    Dispense:  1.5 mL    Refill:  1    Order Specific Question:   Supervising Provider    Answer:   L [[id number]]   glipiZIDE  (GLUCOTROL) 5 MG tablet    Sig: Take 0.5 tablets (2.5 mg total) by mouth daily before breakfast.    Dispense:  90 tablet    Refill:  1    Order Specific Question:  Supervising Provider    Answer:   Sherlene Shams [[id number]]    Return precautions given.   Risks, benefits, and alternatives of the medications and treatment plan prescribed today were discussed, and patient expressed understanding.   Education regarding symptom management and diagnosis given to patient on AVS.  Continue to follow with Allegra Grana, FNP for routine health maintenance.   Rosanna Randy and I agreed with plan.   Rennie Plowman, FNP

## 2020-07-05 NOTE — Assessment & Plan Note (Signed)
Presume improved. Continue zocor 40mg  and will check lipid panel in a couple of months.

## 2020-07-05 NOTE — Assessment & Plan Note (Signed)
Improved, uncontrolled. Increase zoloft to 100mg .

## 2020-07-05 NOTE — Assessment & Plan Note (Signed)
Improved. Experiencing constipation likely from ozempic. Continue ozempic 0.25mg  for now and not increase to 0.5mg . If doesn't resolve, patient will let me know and we will likely start jardiance or increase glipizide. Resume glipizide 2.5mg  and counseled on taking prior to meal. Continue metformin 1000mg  bid.

## 2020-07-05 NOTE — Patient Instructions (Signed)
Start colace ( stool softener)  Increase fiber in diet More water Exercise  Continue ozempic 0.25mg  once per week for now.   Start glipizide 2.5mg  30 minutes prior to breakfast; you must eat on this medication.  Goal is fasting blood sugars less than 120. Call me with any lows or persistent blood sugars in 200s  Increase zoloft 100mg .   Nice to see you!

## 2020-07-06 ENCOUNTER — Ambulatory Visit: Payer: 59 | Admitting: Family

## 2020-07-31 ENCOUNTER — Ambulatory Visit: Payer: 59 | Admitting: Family

## 2020-07-31 ENCOUNTER — Other Ambulatory Visit: Payer: Self-pay

## 2020-07-31 ENCOUNTER — Ambulatory Visit (INDEPENDENT_AMBULATORY_CARE_PROVIDER_SITE_OTHER): Payer: 59 | Admitting: Family

## 2020-07-31 ENCOUNTER — Encounter: Payer: Self-pay | Admitting: Family

## 2020-07-31 VITALS — BP 100/40 | HR 83 | Temp 95.7°F | Ht 67.01 in | Wt 181.4 lb

## 2020-07-31 DIAGNOSIS — E119 Type 2 diabetes mellitus without complications: Secondary | ICD-10-CM

## 2020-07-31 DIAGNOSIS — M545 Low back pain, unspecified: Secondary | ICD-10-CM

## 2020-07-31 DIAGNOSIS — R109 Unspecified abdominal pain: Secondary | ICD-10-CM

## 2020-07-31 LAB — POCT URINALYSIS DIPSTICK
Blood, UA: NEGATIVE
Glucose, UA: POSITIVE — AB
Leukocytes, UA: NEGATIVE
Nitrite, UA: NEGATIVE
Protein, UA: POSITIVE — AB
Spec Grav, UA: >=1.03 — AB (ref 1.010–1.025)
Urobilinogen, UA: 0.2 E.U./dL
pH, UA: 5 (ref 5.0–8.0)

## 2020-07-31 MED ORDER — OZEMPIC (0.25 OR 0.5 MG/DOSE) 2 MG/1.5ML ~~LOC~~ SOPN: 0.2500 mg | PEN_INJECTOR | SUBCUTANEOUS | 1 refills | Status: DC

## 2020-07-31 MED ORDER — TRAMADOL HCL 50 MG PO TABS: 50.0000 mg | ORAL_TABLET | Freq: Three times a day (TID) | ORAL | 0 refills | Status: AC | PRN

## 2020-07-31 MED ORDER — AMOXICILLIN-POT CLAVULANATE 875-125 MG PO TABS: 1.0000 | ORAL_TABLET | Freq: Two times a day (BID) | ORAL | 0 refills | Status: DC

## 2020-08-01 LAB — CBC WITH DIFFERENTIAL/PLATELET
Basophils Absolute: 0.1 10*3/uL (ref 0.0–0.1)
Basophils Relative: 0.6 % (ref 0.0–3.0)
Eosinophils Absolute: 0.2 10*3/uL (ref 0.0–0.7)
Eosinophils Relative: 1.2 % (ref 0.0–5.0)
HCT: 45.5 % (ref 39.0–52.0)
Hemoglobin: 15.8 g/dL (ref 13.0–17.0)
Lymphocytes Relative: 24.5 % (ref 12.0–46.0)
Lymphs Abs: 3.2 10*3/uL (ref 0.7–4.0)
MCHC: 34.7 g/dL (ref 30.0–36.0)
MCV: 90.4 fl (ref 78.0–100.0)
Monocytes Absolute: 1.4 10*3/uL — ABNORMAL HIGH (ref 0.1–1.0)
Monocytes Relative: 10.3 % (ref 3.0–12.0)
Neutro Abs: 8.3 10*3/uL — ABNORMAL HIGH (ref 1.4–7.7)
Neutrophils Relative %: 63.4 % (ref 43.0–77.0)
Platelets: 240 10*3/uL (ref 150.0–400.0)
RBC: 5.03 Mil/uL (ref 4.22–5.81)
RDW: 13.1 % (ref 11.5–15.5)
WBC: 13.1 10*3/uL — ABNORMAL HIGH (ref 4.0–10.5)

## 2020-08-01 LAB — COMPREHENSIVE METABOLIC PANEL
ALT: 16 U/L (ref 0–53)
AST: 11 U/L (ref 0–37)
Albumin: 4.4 g/dL (ref 3.5–5.2)
Alkaline Phosphatase: 74 U/L (ref 39–117)
BUN: 28 mg/dL — ABNORMAL HIGH (ref 6–23)
CO2: 22 mEq/L (ref 19–32)
Calcium: 9.4 mg/dL (ref 8.4–10.5)
Chloride: 106 mEq/L (ref 96–112)
Creatinine, Ser: 0.95 mg/dL (ref 0.40–1.50)
GFR: 96.49 mL/min (ref >60.00)
Glucose, Bld: 234 mg/dL — ABNORMAL HIGH (ref 70–99)
Potassium: 4 mEq/L (ref 3.5–5.1)
Sodium: 138 mEq/L (ref 135–145)
Total Bilirubin: 0.5 mg/dL (ref 0.2–1.2)
Total Protein: 6.8 g/dL (ref 6.0–8.3)

## 2020-08-01 LAB — URINE CULTURE
MICRO NUMBER:: 12109094
Result:: NO GROWTH
SPECIMEN QUALITY:: ADEQUATE

## 2020-08-03 ENCOUNTER — Encounter: Payer: Self-pay | Admitting: Family

## 2020-08-03 NOTE — Progress Notes (Signed)
Acute Office Visit  Subjective:    Patient ID: Calvin Butler, male    DOB: 1975-12-08, 45 y.o.   MRN: [id number]  Chief Complaint  Patient presents with   Abdominal Pain    Pt c/o lower back and abdominal pain. Pt started a new diabetic medication, Ozempic,  last month and started having stomach pain. Pain in side and lower back became stronger in the last week.     HPI Patient is in today with c/o lower back pain and abdominal pain x 1 week and worsening. Patient reports the pain can be a 10/10 at times. Has a history of uncontrolled diabetes and was started on Ozempic last month. Last A1c was 13. He is unsure if it is related. Admits to increased urination. No burning. Has pelvic pain. No STI concerns. Pain is unrelated to movement. Taking OTC Tylenol that has not helped at all  Past Medical History:  Diagnosis Date   Asthma    Diabetes mellitus without complication ([facility])    History of GI bleed    secondary to NSAIDs    Past Surgical History:  Procedure Laterality Date   FACIAL FRACTURE SURGERY  at 45 yrs old   Right side face - due to accident   [place] (PCI-S)  2015    Family History  Problem Relation Age of Onset   Aneurysm Mother        Brain   Diabetes Father    Asthma Sister    Heart attack Sister    Diabetes Maternal Grandmother    Arthritis Daughter    Arthritis Daughter    Prostate cancer Neg Hx    Thyroid cancer Neg Hx     Social History   Socioeconomic History   Marital status: Married    Spouse name: Not on file   Number of children: 2   Years of education: Not on file   Highest education level: Not on file  Occupational History   Occupation: Photographer  Tobacco Use   Smoking status: Every Day    Packs/day: 1.00    Years: 32.00    Pack years: 32.00    Types: Cigarettes   Smokeless tobacco: Never  Vaping Use   Vaping Use: Never used  Substance and Sexual Activity    Alcohol use: Yes    Alcohol/week: 0.0 standard drinks    Comment: Occasional   Drug use: No   Sexual activity: Not on file  Other Topics Concern   Not on file  Social History Narrative   Lives in [place] with wife, daughter, sister and 2 children.      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 90 tablet 1   glucose blood (ONE TOUCH ULTRA TEST) test strip Use as instructed 100 each 2   hydrOXYzine (VISTARIL) 25 MG capsule Take 1 capsule (25 mg total) by mouth 3 (three) times daily as needed for anxiety. 30 capsule 0   Lancets (ONETOUCH ULTRASOFT)  lancets Use as instructed 100 each 2   metFORMIN (GLUCOPHAGE) 1000 MG tablet TAKE 1 TABLET BY MOUTH TWICE DAILY WITH A MEAL 180 tablet 0   sertraline (ZOLOFT) 100 MG tablet Take 1 tablet (100 mg total) by mouth daily. 90 tablet 3   simvastatin (ZOCOR) 40 MG tablet Take 1 tablet (40 mg total) by mouth daily. 90 tablet 3   Semaglutide,0.25 or 0.5MG/DOS, (OZEMPIC, 0.25 OR 0.5 MG/DOSE,) 2 MG/1.5ML SOPN Inject 0.25 mg into the skin once a week. 1.5 mL 1   Continuous Blood Gluc Sensor (FREESTYLE LIBRE 2 SENSOR) MISC 2 Devices by Does not apply route daily. (Patient not taking: No sig reported) 2 each 4   No facility-administered medications prior to visit.    No Known Allergies  Review of Systems  Constitutional: Negative.   Respiratory: Negative.    Cardiovascular: Negative.   Gastrointestinal: Negative.   Endocrine: Negative.   Genitourinary:  Positive for frequency. Negative for decreased urine volume,  dysuria and hematuria.  Musculoskeletal:  Positive for back pain.  Skin: Negative.   Allergic/Immunologic: Negative.   Neurological: Negative.   Psychiatric/Behavioral: Negative.    All other systems reviewed and are negative.     Objective:    Physical Exam Vitals and nursing note reviewed.  Constitutional:      Appearance: He is well-developed.  Cardiovascular:     Rate and Rhythm: Normal rate and regular rhythm.  Pulmonary:     Effort: Pulmonary effort is normal.     Breath sounds: Normal breath sounds.  Abdominal:     General: Abdomen is flat.     Palpations: Abdomen is soft.     Comments: Tenderness to palpation over the pelvic area  Genitourinary:    Comments: Right CVT present Musculoskeletal:        General: No swelling or tenderness. Normal range of motion.  Skin:    General: Skin is warm and dry.  Neurological:     General: No focal deficit present.     Mental Status: He is alert and oriented to person, place, and time.  Psychiatric:        Mood and Affect: Mood normal.        Behavior: Behavior normal.    BP (!) 100/40 (BP Location: Left Arm, Patient Position: Sitting)   Pulse 83   Temp (!) 95.7 F (35.4 C)   Ht 5' 7.01" (1.702 m)   Wt 181 lb 6.4 oz (82.3 kg)   SpO2 97%   BMI 28.40 kg/m  Wt Readings from Last 3 Encounters:  07/31/20 181 lb 6.4 oz (82.3 kg)  07/05/20 181 lb 12.8 oz (82.5 kg)  06/13/20 186 lb (84.4 kg)    Health Maintenance Due  Topic Date Due   PNEUMOCOCCAL POLYSACCHARIDE VACCINE AGE 54-64 HIGH RISK  Never done   Pneumococcal Vaccine 32-53 Years old (1 - PCV) Never done   OPHTHALMOLOGY EXAM  Never done   Hepatitis C Screening  Never done   COLONOSCOPY (Pts 45-61yr Insurance coverage will need to be confirmed)  Never done    There are no preventive care reminders to display for this patient.   Lab Results  Component Value Date   TSH 1.99 05/16/2020   Lab Results  Component Value Date   WBC 13.1 (H) 07/31/2020   HGB 15.8  07/31/2020   HCT 45.5 07/31/2020   MCV 90.4 07/31/2020   PLT 240.0 07/31/2020   Lab Results  Component Value Date   NA 138 07/31/2020  K 4.0 07/31/2020   CO2 22 07/31/2020   GLUCOSE 234 (H) 07/31/2020   BUN 28 (H) 07/31/2020   CREATININE 0.95 07/31/2020   BILITOT 0.5 07/31/2020   ALKPHOS 74 07/31/2020   AST 11 07/31/2020   ALT 16 07/31/2020   PROT 6.8 07/31/2020   ALBUMIN 4.4 07/31/2020   CALCIUM 9.4 07/31/2020   ANIONGAP 11 05/24/2020   GFR 96.49 07/31/2020   Lab Results  Component Value Date   CHOL 196 05/16/2020   Lab Results  Component Value Date   HDL 32.60 (L) 05/16/2020   Lab Results  Component Value Date   LDLCALC NOT CALC 11/17/2014   Lab Results  Component Value Date   TRIG 337.0 (H) 05/16/2020   Lab Results  Component Value Date   CHOLHDL 6 05/16/2020   Lab Results  Component Value Date   HGBA1C 13.1 (H) 05/16/2020       Assessment & Plan:   Problem List Items Addressed This Visit   None Visit Diagnoses     Low back pain, unspecified back pain laterality, unspecified chronicity, unspecified whether sciatica present    -  Primary   Relevant Medications   traMADol (ULTRAM) 50 MG tablet   Other Relevant Orders   POCT Urinalysis Dipstick (Completed)   Urine Culture (Completed)   CBC w/Diff (Completed)   Comp Met (CMET) (Completed)   Type 2 diabetes mellitus without complication, without long-term current use of insulin (HCC)       Relevant Orders   Comp Met (CMET) (Completed)   Acute right flank pain            Meds ordered this encounter  Medications   amoxicillin-clavulanate (AUGMENTIN) 875-125 MG tablet    Sig: Take 1 tablet by mouth 2 (two) times daily.    Dispense:  20 tablet    Refill:  0   traMADol (ULTRAM) 50 MG tablet    Sig: Take 1 tablet (50 mg total) by mouth every 8 (eight) hours as needed for up to 5 days.    Dispense:  15 tablet    Refill:  0   Plan: Suspect pyelonephritis. However, patient is not febrile and is  stable. Will obtain labs, start antibiotic. Instructed patient to take his both antibiotics today. Recheck in 1 week. Call if symptoms worsen or persist. Resume Ozempic.   Kennyth Arnold, FNP

## 2020-08-10 ENCOUNTER — Ambulatory Visit (INDEPENDENT_AMBULATORY_CARE_PROVIDER_SITE_OTHER): Payer: 59 | Admitting: Family

## 2020-08-10 ENCOUNTER — Other Ambulatory Visit: Payer: Self-pay

## 2020-08-10 ENCOUNTER — Encounter: Payer: Self-pay | Admitting: Family

## 2020-08-10 VITALS — BP 112/68 | HR 74 | Temp 98.4°F | Ht 67.01 in | Wt 183.0 lb

## 2020-08-10 DIAGNOSIS — Z1159 Encounter for screening for other viral diseases: Secondary | ICD-10-CM

## 2020-08-10 DIAGNOSIS — F419 Anxiety disorder, unspecified: Secondary | ICD-10-CM | POA: Diagnosis not present

## 2020-08-10 DIAGNOSIS — E119 Type 2 diabetes mellitus without complications: Secondary | ICD-10-CM

## 2020-08-10 DIAGNOSIS — F32A Depression, unspecified: Secondary | ICD-10-CM

## 2020-08-10 MED ORDER — OZEMPIC (0.25 OR 0.5 MG/DOSE) 2 MG/1.5ML ~~LOC~~ SOPN: 0.2500 mg | PEN_INJECTOR | SUBCUTANEOUS | 1 refills | Status: DC

## 2020-08-10 NOTE — Assessment & Plan Note (Signed)
Very uncontrolled.  Advised patient to resume Ozempic 0.25 and after 4 weeks I would like to increase to 0.5 mg.  He will remain on glipizide 2.5 mg and metformin 1000mg  bid.  Advised that I be more inclined to increase Ozempic than glipizide particularly when he is not consistent with eating breakfast this.  Provided education on checking fasting blood sugar and asked him to drop off a log in 2 weeks time as well as call me if blood sugars are consistently in the 200s so we can make changes ahead of next visit.

## 2020-08-10 NOTE — Progress Notes (Signed)
Subjective:    Patient ID: Calvin Butler, male    DOB: [birth year], 45 y.o.   MRN: [id number]  CC: Calvin Butler is a 45 y.o. male who presents today for DM follow up.   HPI: Feels well today No back pain, fever, dysuria.   Diabetes- FBG 205.  He ran out of Ozempic 0.25 mg and stopped medication 4 weeks ago as not sure if related to low back pain.  He has been compliant with glipizide 2.5 mg, metformin 1000 twice daily . He would to resume glipizide 5mg  as felt helped to control his blood sugar. He continues to skip breakfast most days.   Anxiety depression-Zoloft 100 mg which was increased at last visit and feels anxiety and depression is better controlled. No si/hi.     seen by colleague 07/31/2020 for suspected pyelonephritis.  Tramadol, Augmentin. 07/31/20 BUN 28, WBC 13.1, neutro 8.3 No growth urine culture.  No blood seen urinalysis  Due for colonoscopy No family h/o colon cancer No blood in stool, change in bowel habits.    HISTORY:  Past Medical History:  Diagnosis Date   Asthma    Diabetes mellitus without complication (HCC)    History of GI bleed    secondary to NSAIDs   Past Surgical History:  Procedure Laterality Date   FACIAL FRACTURE SURGERY  at 45 yrs old   Right side face - due to accident   FACIAL FRACTURE SURGERY     PERCUTANEOUS CORONARY STENT INTERVENTION (PCI-S)  2015   Family History  Problem Relation Age of Onset   Aneurysm Mother        Brain   Diabetes Father    Asthma Sister    Heart attack Sister    Diabetes Maternal Grandmother    Arthritis Daughter    Arthritis Daughter    Prostate cancer Neg Hx    Thyroid cancer Neg Hx     Allergies: Patient has no known allergies. Current Outpatient Medications on File Prior to Visit  Medication Sig Dispense Refill   glipiZIDE (GLUCOTROL) 5 MG tablet Take 0.5 tablets (2.5 mg total) by mouth daily before breakfast. 90 tablet 1   glucose blood (ONE TOUCH ULTRA TEST) test strip Use as  instructed 100 each 2   hydrOXYzine (VISTARIL) 25 MG capsule Take 1 capsule (25 mg total) by mouth 3 (three) times daily as needed for anxiety. 30 capsule 0   Lancets (ONETOUCH ULTRASOFT) lancets Use as instructed 100 each 2   metFORMIN (GLUCOPHAGE) 1000 MG tablet TAKE 1 TABLET BY MOUTH TWICE DAILY WITH A MEAL 180 tablet 0   sertraline (ZOLOFT) 100 MG tablet Take 1 tablet (100 mg total) by mouth daily. 90 tablet 3   simvastatin (ZOCOR) 40 MG tablet Take 1 tablet (40 mg total) by mouth daily. 90 tablet 3   No current facility-administered medications on file prior to visit.    Social History   Tobacco Use   Smoking status: Every Day    Packs/day: 1.00    Years: 32.00    Pack years: 32.00    Types: Cigarettes   Smokeless tobacco: Never  Vaping Use   Vaping Use: Never used  Substance Use Topics   Alcohol use: Yes    Alcohol/week: 0.0 standard drinks    Comment: Occasional   Drug use: No    Review of Systems  Constitutional:  Negative for chills and fever.  Respiratory:  Negative for cough.   Cardiovascular:  Negative for chest pain  and palpitations.  Gastrointestinal:  Negative for nausea and vomiting.  Genitourinary:  Negative for dysuria.  Musculoskeletal:  Negative for back pain.     Objective:    BP 112/68 (BP Location: Left Arm, Patient Position: Sitting, Cuff Size: Large)   Pulse 74   Temp 98.4 F (36.9 C) (Oral)   Ht 5' 7.01" (1.702 m)   Wt 183 lb (83 kg)   SpO2 97%   BMI 28.65 kg/m  BP Readings from Last 3 Encounters:  08/10/20 112/68  07/31/20 (!) 100/40  07/05/20 110/68   Wt Readings from Last 3 Encounters:  08/10/20 183 lb (83 kg)  07/31/20 181 lb 6.4 oz (82.3 kg)  07/05/20 181 lb 12.8 oz (82.5 kg)    Physical Exam Vitals reviewed.  Constitutional:      Appearance: He is well-developed.  Cardiovascular:     Rate and Rhythm: Regular rhythm.     Heart sounds: Normal heart sounds.  Pulmonary:     Effort: Pulmonary effort is normal. No respiratory  distress.     Breath sounds: Normal breath sounds. No wheezing, rhonchi or rales.  Skin:    General: Skin is warm and dry.  Neurological:     Mental Status: He is alert.  Psychiatric:        Speech: Speech normal.        Behavior: Behavior normal.       Assessment & Plan:   Problem List Items Addressed This Visit       Endocrine   Diabetes mellitus without complication (HCC)    Very uncontrolled.  Advised patient to resume Ozempic 0.25 and after 4 weeks I would like to increase to 0.5 mg.  He will remain on glipizide 2.5 mg and metformin 1000mg  bid.  Advised that I be more inclined to increase Ozempic than glipizide particularly when he is not consistent with eating breakfast this.  Provided education on checking fasting blood sugar and asked him to drop off a log in 2 weeks time as well as call me if blood sugars are consistently in the 200s so we can make changes ahead of next visit.       Relevant Medications   Semaglutide,0.25 or 0.5MG /DOS, (OZEMPIC, 0.25 OR 0.5 MG/DOSE,) 2 MG/1.5ML SOPN     Other   Anxiety and depression    Improved, continue Zoloft 100       Other Visit Diagnoses     Encounter for hepatitis C screening test for low risk patient    -  Primary   Relevant Orders   Hepatitis C antibody   Type 2 diabetes mellitus without complication, without long-term current use of insulin (HCC)       Relevant Medications   Semaglutide,0.25 or 0.5MG /DOS, (OZEMPIC, 0.25 OR 0.5 MG/DOSE,) 2 MG/1.5ML SOPN   Other Relevant Orders   Hemoglobin A1c   CBC with Differential/Platelet        I have discontinued D. Fadeley's amoxicillin-clavulanate. I am also having him maintain his onetouch ultrasoft, glucose blood, metFORMIN, hydrOXYzine, simvastatin, sertraline, glipiZIDE, and Ozempic (0.25 or 0.5 MG/DOSE).   Meds ordered this encounter  Medications   Semaglutide,0.25 or 0.5MG /DOS, (OZEMPIC, 0.25 OR 0.5 MG/DOSE,) 2 MG/1.5ML SOPN    Sig: Inject 0.25 mg into the  skin once a week.    Dispense:  1.5 mL    Refill:  1    Order Specific Question:   Supervising Provider    Answer:   Margurite Auerbach L [[id number]]  Return precautions given.   Risks, benefits, and alternatives of the medications and treatment plan prescribed today were discussed, and patient expressed understanding.   Education regarding symptom management and diagnosis given to patient on AVS.  Continue to follow with Allegra Grana, FNP for routine health maintenance.   Rosanna Randy and I agreed with plan.   Rennie Plowman, FNP

## 2020-08-10 NOTE — Patient Instructions (Signed)
Please check fasting blood sugars daily with the goal being between 80 and 120. Any blood sugars less than 70 or greater than 200 please call the office let me know.  Please ensure that if you run out of any medications particularly Ozempic, glipizide or metformin which were using for  Diabetes you let us know right away.     look forward to seeing you in 1 month!

## 2020-08-10 NOTE — Assessment & Plan Note (Signed)
Improved, continue Zoloft 100

## 2020-08-15 ENCOUNTER — Ambulatory Visit: Payer: 59 | Admitting: Family

## 2020-08-15 ENCOUNTER — Other Ambulatory Visit: Payer: 59

## 2020-08-22 ENCOUNTER — Other Ambulatory Visit (INDEPENDENT_AMBULATORY_CARE_PROVIDER_SITE_OTHER): Payer: 59

## 2020-08-22 ENCOUNTER — Telehealth: Payer: Self-pay | Admitting: Family

## 2020-08-22 ENCOUNTER — Other Ambulatory Visit: Payer: Self-pay

## 2020-08-22 DIAGNOSIS — Z1159 Encounter for screening for other viral diseases: Secondary | ICD-10-CM

## 2020-08-22 DIAGNOSIS — E119 Type 2 diabetes mellitus without complications: Secondary | ICD-10-CM

## 2020-08-22 LAB — CBC WITH DIFFERENTIAL/PLATELET
Basophils Absolute: 0 10*3/uL (ref 0.0–0.1)
Basophils Relative: 0.3 % (ref 0.0–3.0)
Eosinophils Absolute: 0.1 10*3/uL (ref 0.0–0.7)
Eosinophils Relative: 0.9 % (ref 0.0–5.0)
HCT: 48.5 % (ref 39.0–52.0)
Hemoglobin: 16.3 g/dL (ref 13.0–17.0)
Lymphocytes Relative: 20.4 % (ref 12.0–46.0)
Lymphs Abs: 2.5 10*3/uL (ref 0.7–4.0)
MCHC: 33.6 g/dL (ref 30.0–36.0)
MCV: 92.3 fl (ref 78.0–100.0)
Monocytes Absolute: 1.1 10*3/uL — ABNORMAL HIGH (ref 0.1–1.0)
Monocytes Relative: 9.1 % (ref 3.0–12.0)
Neutro Abs: 8.3 10*3/uL — ABNORMAL HIGH (ref 1.4–7.7)
Neutrophils Relative %: 69.3 % (ref 43.0–77.0)
Platelets: 233 10*3/uL (ref 150.0–400.0)
RBC: 5.25 Mil/uL (ref 4.22–5.81)
RDW: 13.8 % (ref 11.5–15.5)
WBC: 12 10*3/uL — ABNORMAL HIGH (ref 4.0–10.5)

## 2020-08-22 LAB — HEMOGLOBIN A1C: Hgb A1c MFr Bld: 10.6 % — ABNORMAL HIGH (ref 4.6–6.5)

## 2020-08-22 MED ORDER — TRULICITY 0.75 MG/0.5ML ~~LOC~~ SOAJ: 0.7500 mg | SUBCUTANEOUS | 1 refills | Status: DC

## 2020-08-22 MED ORDER — GLIPIZIDE 5 MG PO TABS: 2.5000 mg | ORAL_TABLET | Freq: Every day | ORAL | 1 refills | Status: DC

## 2020-08-22 MED ORDER — METFORMIN HCL 1000 MG PO TABS: ORAL_TABLET | ORAL | 0 refills | Status: DC

## 2020-08-22 NOTE — Addendum Note (Signed)
Addended by: Warden Fillers on: 08/22/2020 10:38 AM   Modules accepted: Orders

## 2020-08-22 NOTE — Telephone Encounter (Signed)
Patient needs the following refills; metFORMIN (GLUCOPHAGE) 1000 MG tablet, Semaglutide,0.25 or 0.5MG /DOS, (OZEMPIC, 0.25 OR 0.5 MG/DOSE,) 2 MG/1.5ML SOPN, patient has never got this medication. He says Jason Coop has called it in and Pharmacy states they never go a prescription. Also a refill on  glipiZIDE (GLUCOTROL) 5 MG tablet

## 2020-08-22 NOTE — Addendum Note (Signed)
Addended by: Allegra Grana on: 08/22/2020 04:18 PM   Modules accepted: Orders

## 2020-08-22 NOTE — Telephone Encounter (Signed)
Called and spoke to Calvin Butler. Calvin Butler states that has not received any ozempic and he has called [facility] and they state that they do not have his prescription. Checking the ozempic prescription in epic does not show that it was received by [facility]. Called in to [facility] and they state that they have not received his prescription for ozempic and All strengths of ozempic are on back order. Called to inform Calvin Butler and he verbalized understanding. He did not have another pharmacy he prefers his medication sent to.  Pt states that he needs a refill on Glipizide and Metformin. They have been refilled and sent to [facility] [address].  Is there an alternative you would want to send in for Ozempic?

## 2020-08-22 NOTE — Telephone Encounter (Signed)
Call patient Tell him not to pick up or start Ozempic.  Have sent Trulicity.  Please have him call us back this week and let us know if he was able to get Trulicity.  Trulicity is in the same drug class as Ozempic

## 2020-08-23 LAB — HEPATITIS C ANTIBODY
Hepatitis C Ab: NONREACTIVE
SIGNAL TO CUT-OFF: 0 (ref <1.00)

## 2020-08-23 NOTE — Telephone Encounter (Signed)
Left a message to call back.

## 2020-08-27 NOTE — Telephone Encounter (Signed)
Called and spoke to [place]. Calvin Butler states that he has already picked up his Trulicity and has no further questions.

## 2020-08-31 ENCOUNTER — Other Ambulatory Visit: Payer: Self-pay

## 2020-08-31 DIAGNOSIS — D751 Secondary polycythemia: Secondary | ICD-10-CM

## 2020-09-05 ENCOUNTER — Other Ambulatory Visit: Payer: 59

## 2020-09-06 ENCOUNTER — Telehealth: Payer: 59 | Admitting: Oncology

## 2020-09-12 ENCOUNTER — Ambulatory Visit (INDEPENDENT_AMBULATORY_CARE_PROVIDER_SITE_OTHER): Payer: 59 | Admitting: Family

## 2020-09-12 ENCOUNTER — Encounter: Payer: Self-pay | Admitting: Family

## 2020-09-12 ENCOUNTER — Other Ambulatory Visit: Payer: Self-pay

## 2020-09-12 VITALS — BP 118/78 | HR 81 | Temp 98.0°F | Ht 67.01 in | Wt 183.0 lb

## 2020-09-12 DIAGNOSIS — F32A Depression, unspecified: Secondary | ICD-10-CM

## 2020-09-12 DIAGNOSIS — F419 Anxiety disorder, unspecified: Secondary | ICD-10-CM | POA: Diagnosis not present

## 2020-09-12 DIAGNOSIS — E119 Type 2 diabetes mellitus without complications: Secondary | ICD-10-CM | POA: Diagnosis not present

## 2020-09-12 MED ORDER — HYDROXYZINE HCL 10 MG PO TABS: 10.0000 mg | ORAL_TABLET | Freq: Two times a day (BID) | ORAL | 1 refills | Status: DC | PRN

## 2020-09-12 MED ORDER — TRULICITY 1.5 MG/0.5ML ~~LOC~~ SOAJ
1.5000 mg | SUBCUTANEOUS | 1 refills | Status: DC
Start: 2020-09-12 — End: 2021-02-12

## 2020-09-12 NOTE — Patient Instructions (Addendum)
Please continue to check fasting blood sugars.  The goal is fasting blood sugar less than 150 and then will be we are approaching goal.  Please pick up new prescription for Trulicity 1.5 mg.    For dentist, please call:   [facility]  [address] [place], [place] [address]   Dental [phone]  Nice to see you

## 2020-09-12 NOTE — Progress Notes (Signed)
Subjective:    Patient ID: Calvin Butler, male    DOB: 12-12-1975, 45 y.o.   MRN: [id number]  CC: Calvin Butler is a 45 y.o. male who presents today for DM follow up.   HPI: Feels well today.  No new complaints.   DM-compliant with trulicity 0.75 mg x 3 weeks,  glipizide 2.5 mg, metformin 1000 mg twice daily. He is not eating breakfast. FBG 250. After largest meal 350 which has come down from 400-500.  He feels blood sugars have definitely improved.  No blurry vision, polydipsia, polyuria.  He has had Hematology consult for leukocytosis with Dr Cathie Hoops.  He plans to return to follow-up for blood work and call hematology to schedule this  He has no routine dental care and would like recommendation.   HISTORY:  Past Medical History:  Diagnosis Date   Asthma    Diabetes mellitus without complication (HCC)    History of GI bleed    secondary to NSAIDs   Past Surgical History:  Procedure Laterality Date   FACIAL FRACTURE SURGERY  at 45 yrs old   Right side face - due to accident   FACIAL FRACTURE SURGERY     PERCUTANEOUS CORONARY STENT INTERVENTION ([facility])  2015   Family History  Problem Relation Age of Onset   Aneurysm Mother        Brain   Diabetes Father    Asthma Sister    Heart attack Sister    Diabetes Maternal Grandmother    Arthritis Daughter    Arthritis Daughter    Prostate cancer Neg Hx    Thyroid cancer Neg Hx     Allergies: Patient has no known allergies. Current Outpatient Medications on File Prior to Visit  Medication Sig Dispense Refill   glipiZIDE (GLUCOTROL) 5 MG tablet Take 0.5 tablets (2.5 mg total) by mouth daily before breakfast. 90 tablet 1   glucose blood (ONE TOUCH ULTRA TEST) test strip Use as instructed 100 each 2   Lancets (ONETOUCH ULTRASOFT) lancets Use as instructed 100 each 2   metFORMIN (GLUCOPHAGE) 1000 MG tablet TAKE 1 TABLET BY MOUTH TWICE DAILY WITH A MEAL 180 tablet 0   simvastatin (ZOCOR) 40 MG tablet Take 1 tablet (40 mg  total) by mouth daily. 90 tablet 3   sertraline (ZOLOFT) 100 MG tablet Take 1 tablet (100 mg total) by mouth daily. (Patient not taking: Reported on 09/12/2020) 90 tablet 3   No current facility-administered medications on file prior to visit.    Social History   Tobacco Use   Smoking status: Every Day    Packs/day: 1.00    Years: 32.00    Pack years: 32.00    Types: Cigarettes   Smokeless tobacco: Never  Vaping Use   Vaping Use: Never used  Substance Use Topics   Alcohol use: Yes    Alcohol/week: 0.0 standard drinks    Comment: Occasional   Drug use: No    Review of Systems  Constitutional:  Negative for chills and fever.  Eyes:  Negative for visual disturbance.  Respiratory:  Negative for cough.   Cardiovascular:  Negative for chest pain and palpitations.  Gastrointestinal:  Negative for nausea and vomiting.  Endocrine: Negative for polydipsia and polyuria.     Objective:    BP 118/78   Pulse 81   Temp 98 F (36.7 C)   Ht 5' 7.01" (1.702 m)   Wt 183 lb (83 kg)   SpO2 94%   BMI  28.66 kg/m  BP Readings from Last 3 Encounters:  09/12/20 118/78  08/10/20 112/68  07/31/20 (!) 100/40   Wt Readings from Last 3 Encounters:  09/12/20 183 lb (83 kg)  08/10/20 183 lb (83 kg)  07/31/20 181 lb 6.4 oz (82.3 kg)    Physical Exam Vitals reviewed.  Constitutional:      Appearance: He is well-developed.  Cardiovascular:     Rate and Rhythm: Regular rhythm.     Heart sounds: Normal heart sounds.  Pulmonary:     Effort: Pulmonary effort is normal. No respiratory distress.     Breath sounds: Normal breath sounds. No wheezing, rhonchi or rales.  Skin:    General: Skin is warm and dry.  Neurological:     Mental Status: He is alert.  Psychiatric:        Speech: Speech normal.        Behavior: Behavior normal.       Assessment & Plan:   Problem List Items Addressed This Visit       Endocrine   Diabetes mellitus without complication (HCC)    Uncontrolled  however improving.  No acute symptoms today.  Increase Trulicity to 1.5 mg, continue glipizide 2.5 mg, metformin 1000 mg twice daily.  Likely will d/c glipizide as blood sugars become more controlled.  Follow-up 1 month      Relevant Medications   Dulaglutide (TRULICITY) 1.5 MG/0.5ML SOPN     Other   Anxiety and depression   Relevant Medications   hydrOXYzine (ATARAX/VISTARIL) 10 MG tablet   Other Visit Diagnoses     Type 2 diabetes mellitus without complication, without long-term current use of insulin (HCC)    -  Primary   Relevant Medications   Dulaglutide (TRULICITY) 1.5 MG/0.5ML SOPN        I have discontinued Margurite Auerbach D. Brecheisen's hydrOXYzine and Trulicity. I am also having him start on Trulicity and hydrOXYzine. Additionally, I am having him maintain his onetouch ultrasoft, glucose blood, simvastatin, sertraline, metFORMIN, and glipiZIDE.   Meds ordered this encounter  Medications   Dulaglutide (TRULICITY) 1.5 MG/0.5ML SOPN    Sig: Inject 1.5 mg into the skin once a week.    Dispense:  3 mL    Refill:  1    Order Specific Question:   Supervising Provider    Answer:   Duncan Dull L [[id number]]   hydrOXYzine (ATARAX/VISTARIL) 10 MG tablet    Sig: Take 1 tablet (10 mg total) by mouth 2 (two) times daily as needed for anxiety.    Dispense:  30 tablet    Refill:  1    Order Specific Question:   Supervising Provider    Answer:   Sherlene Shams [[id number]]    Return precautions given.   Risks, benefits, and alternatives of the medications and treatment plan prescribed today were discussed, and patient expressed understanding.   Education regarding symptom management and diagnosis given to patient on AVS.  Continue to follow with Allegra Grana, FNP for routine health maintenance.   Rosanna Randy and I agreed with plan.   Rennie Plowman, FNP

## 2020-09-13 NOTE — Assessment & Plan Note (Addendum)
Uncontrolled however improving.  No acute symptoms today.  Increase Trulicity to 1.5 mg, continue glipizide 2.5 mg, metformin 1000 mg twice daily.  Likely will d/c glipizide as blood sugars become more controlled.  Follow-up 1 month

## 2020-10-10 ENCOUNTER — Ambulatory Visit: Payer: 59 | Admitting: Family

## 2020-10-31 ENCOUNTER — Telehealth: Payer: Self-pay | Admitting: Family

## 2020-10-31 DIAGNOSIS — E119 Type 2 diabetes mellitus without complications: Secondary | ICD-10-CM

## 2020-10-31 MED ORDER — METFORMIN HCL 1000 MG PO TABS
ORAL_TABLET | ORAL | 0 refills | Status: DC
Start: 2020-10-31 — End: 2021-03-15

## 2020-10-31 NOTE — Telephone Encounter (Signed)
FYI patient has moved to [place] staed she hates t o lose his provider but he was able to get a promotion and Raise he had to move he wanted his PCP to know. Metformin has been filled.

## 2020-10-31 NOTE — Telephone Encounter (Signed)
Patient called in stating he has recently moved with half of a prescription and is requesting a refill on his metFORMIN (GLUCOPHAGE) 1000 MG tablet until he can find a provider in the area he has moved to.Please send his prescription to the [facility] in [place] on [address] and call him with an update.

## 2020-11-07 ENCOUNTER — Ambulatory Visit: Payer: 59 | Admitting: Family

## 2021-02-09 ENCOUNTER — Other Ambulatory Visit: Payer: Self-pay | Admitting: Family

## 2021-02-09 DIAGNOSIS — E119 Type 2 diabetes mellitus without complications: Secondary | ICD-10-CM

## 2021-03-15 ENCOUNTER — Other Ambulatory Visit: Payer: Self-pay

## 2021-03-15 DIAGNOSIS — E119 Type 2 diabetes mellitus without complications: Secondary | ICD-10-CM

## 2021-03-15 MED ORDER — METFORMIN HCL 1000 MG PO TABS: ORAL_TABLET | ORAL | 0 refills | Status: DC

## 2021-03-15 MED ORDER — TRULICITY 1.5 MG/0.5ML ~~LOC~~ SOAJ: SUBCUTANEOUS | 1 refills | Status: DC

## 2021-06-20 ENCOUNTER — Other Ambulatory Visit: Payer: Self-pay | Admitting: Family

## 2021-06-20 DIAGNOSIS — E119 Type 2 diabetes mellitus without complications: Secondary | ICD-10-CM

## 2021-09-11 ENCOUNTER — Encounter: Payer: Self-pay | Admitting: Family

## 2021-09-11 ENCOUNTER — Ambulatory Visit: Payer: Managed Care, Other (non HMO) | Admitting: Family

## 2021-09-11 ENCOUNTER — Telehealth: Payer: Self-pay | Admitting: Family

## 2021-09-11 VITALS — BP 128/78 | HR 79 | Temp 98.7°F | Ht 66.0 in | Wt 187.4 lb

## 2021-09-11 DIAGNOSIS — K5900 Constipation, unspecified: Secondary | ICD-10-CM | POA: Insufficient documentation

## 2021-09-11 DIAGNOSIS — E785 Hyperlipidemia, unspecified: Secondary | ICD-10-CM

## 2021-09-11 DIAGNOSIS — E119 Type 2 diabetes mellitus without complications: Secondary | ICD-10-CM

## 2021-09-11 DIAGNOSIS — K760 Fatty (change of) liver, not elsewhere classified: Secondary | ICD-10-CM

## 2021-09-11 DIAGNOSIS — F419 Anxiety disorder, unspecified: Secondary | ICD-10-CM

## 2021-09-11 DIAGNOSIS — R911 Solitary pulmonary nodule: Secondary | ICD-10-CM

## 2021-09-11 DIAGNOSIS — R198 Other specified symptoms and signs involving the digestive system and abdomen: Secondary | ICD-10-CM | POA: Diagnosis not present

## 2021-09-11 DIAGNOSIS — Z125 Encounter for screening for malignant neoplasm of prostate: Secondary | ICD-10-CM

## 2021-09-11 DIAGNOSIS — F32A Depression, unspecified: Secondary | ICD-10-CM

## 2021-09-11 LAB — POCT GLYCOSYLATED HEMOGLOBIN (HGB A1C): Hemoglobin A1C: 11.3 % — AB (ref 4.0–5.6)

## 2021-09-11 LAB — COMPREHENSIVE METABOLIC PANEL
ALT: 16 U/L (ref 0–53)
AST: 11 U/L (ref 0–37)
Albumin: 4.2 g/dL (ref 3.5–5.2)
Alkaline Phosphatase: 74 U/L (ref 39–117)
BUN: 17 mg/dL (ref 6–23)
CO2: 25 mEq/L (ref 19–32)
Calcium: 9.1 mg/dL (ref 8.4–10.5)
Chloride: 103 mEq/L (ref 96–112)
Creatinine, Ser: 0.91 mg/dL (ref 0.40–1.50)
GFR: 100.81 mL/min (ref >60.00)
Glucose, Bld: 212 mg/dL — ABNORMAL HIGH (ref 70–99)
Potassium: 3.8 mEq/L (ref 3.5–5.1)
Sodium: 135 mEq/L (ref 135–145)
Total Bilirubin: 0.6 mg/dL (ref 0.2–1.2)
Total Protein: 6.7 g/dL (ref 6.0–8.3)

## 2021-09-11 LAB — CBC WITH DIFFERENTIAL/PLATELET
Basophils Absolute: 0 10*3/uL (ref 0.0–0.1)
Basophils Relative: 0.2 % (ref 0.0–3.0)
Eosinophils Absolute: 0.1 10*3/uL (ref 0.0–0.7)
Eosinophils Relative: 1.3 % (ref 0.0–5.0)
HCT: 47.9 % (ref 39.0–52.0)
Hemoglobin: 16.1 g/dL (ref 13.0–17.0)
Lymphocytes Relative: 24.5 % (ref 12.0–46.0)
Lymphs Abs: 2.4 10*3/uL (ref 0.7–4.0)
MCHC: 33.7 g/dL (ref 30.0–36.0)
MCV: 90.2 fl (ref 78.0–100.0)
Monocytes Absolute: 0.9 10*3/uL (ref 0.1–1.0)
Monocytes Relative: 9.7 % (ref 3.0–12.0)
Neutro Abs: 6.3 10*3/uL (ref 1.4–7.7)
Neutrophils Relative %: 64.3 % (ref 43.0–77.0)
Platelets: 234 10*3/uL (ref 150.0–400.0)
RBC: 5.31 Mil/uL (ref 4.22–5.81)
RDW: 13.9 % (ref 11.5–15.5)
WBC: 9.8 10*3/uL (ref 4.0–10.5)

## 2021-09-11 LAB — TSH: TSH: 2.21 u[IU]/mL (ref 0.35–5.50)

## 2021-09-11 LAB — URINALYSIS, ROUTINE W REFLEX MICROSCOPIC
Bilirubin Urine: NEGATIVE
Hgb urine dipstick: NEGATIVE
Ketones, ur: NEGATIVE
Leukocytes,Ua: NEGATIVE
Nitrite: NEGATIVE
RBC / HPF: NONE SEEN (ref 0–?)
Specific Gravity, Urine: >=1.03 — AB (ref 1.000–1.030)
Urine Glucose: >=1000 — AB
Urobilinogen, UA: 0.2 (ref 0.0–1.0)
pH: 6 (ref 5.0–8.0)

## 2021-09-11 LAB — PSA: PSA: 0.6 ng/mL (ref 0.10–4.00)

## 2021-09-11 LAB — LIPID PANEL
Cholesterol: 166 mg/dL (ref 0–200)
HDL: 29.5 mg/dL — ABNORMAL LOW (ref >39.00)
NonHDL: 136.29
Total CHOL/HDL Ratio: 6
Triglycerides: 369 mg/dL — ABNORMAL HIGH (ref 0.0–149.0)
VLDL: 73.8 mg/dL — ABNORMAL HIGH (ref 0.0–40.0)

## 2021-09-11 LAB — LDL CHOLESTEROL, DIRECT: Direct LDL: 88 mg/dL

## 2021-09-11 MED ORDER — TRULICITY 0.75 MG/0.5ML ~~LOC~~ SOAJ: 0.7500 mg | SUBCUTANEOUS | 2 refills | Status: DC

## 2021-09-11 MED ORDER — GLIPIZIDE ER 5 MG PO TB24: 5.0000 mg | ORAL_TABLET | Freq: Every day | ORAL | 1 refills | Status: DC

## 2021-09-11 MED ORDER — SIMVASTATIN 40 MG PO TABS: 40.0000 mg | ORAL_TABLET | Freq: Every day | ORAL | 3 refills | Status: AC

## 2021-09-11 NOTE — Progress Notes (Signed)
Discussed during OV. Please see OV notes

## 2021-09-11 NOTE — Assessment & Plan Note (Signed)
Reviewed CT abdomen pelvis after patient had left the office today.  He has upcoming appointment with me in October.  I am having the nurse  reach out to him to discuss hepatic stenosis and mail him literature so we can discuss in detail at follow-up

## 2021-09-11 NOTE — Progress Notes (Signed)
Subjective:    Patient ID: Calvin Butler, male    DOB: 1975-12-01, 46 y.o.   MRN: [id number]  CC: Calvin Butler is a 46 y.o. male who presents today for follow up.   HPI: He has had fluctuating constipation and loose stools x 4 weeks, waxes and wanes.  He tried laxative ( unsure of name) with mixed result.  Last BM was last night. Stool is smaller in shape.  No blood in stool, fever, N, v. He will have episodic abdominal cramping.  No food triggers.  No h/o lactose intolerance  He no longer has depression or anxiety.  He does not feel he needs the Zoloft and has stopped medication.     Denies thoughts of hurting himself or anyone else  He ran out of of Zocor and would like a refill   He was seen in the emergency room 08/19/2021 with [facility] for abdominal pain.   Given Bentyl and insulin with improvement.   ED labs 08/19/21 Lipase 31 Troponin < 3 Glucose 410 D Dimer 0.20  CT a/p reviewed [facility] 08/19/21 Showed 2 mm right lower lobe lung nodule.  Pancreas without specific abnormality.  Hepatic cytosis.  Prostate mildly prominent.  Moderate stool.  No bowel obstruction.  No obstructive uropathy.  DM-compliant with glipizide 2.5 mg, metformin 1000 mg qd. FBG 200. Max FBG 250. He is no longer on trulicity. He didn't feel constipated on trulicity and would like refill.     HISTORY:  Past Medical History:  Diagnosis Date   Asthma    Diabetes mellitus without complication (HCC)    History of GI bleed    secondary to NSAIDs   Past Surgical History:  Procedure Laterality Date   FACIAL FRACTURE SURGERY  at 46 yrs old   Right side face - due to accident   FACIAL FRACTURE SURGERY     PERCUTANEOUS CORONARY STENT INTERVENTION (PCI-S)  2015   Family History  Problem Relation Age of Onset   Aneurysm Mother        Brain   Diabetes Father    Asthma Sister    Heart attack Sister    Diabetes Maternal Grandmother    Arthritis Daughter    Arthritis Daughter    Prostate cancer Neg  Hx    Thyroid cancer Neg Hx     Allergies: Patient has no known allergies. Current Outpatient Medications on File Prior to Visit  Medication Sig Dispense Refill   metFORMIN (GLUCOPHAGE) 1000 MG tablet TAKE 1 TABLET BY MOUTH TWICE DAILY WITH A MEAL 180 tablet 0   glucose blood (ONE TOUCH ULTRA TEST) test strip Use as instructed (Patient not taking: Reported on 09/11/2021) 100 each 2   Lancets (ONETOUCH ULTRASOFT) lancets Use as instructed (Patient not taking: Reported on 09/11/2021) 100 each 2   No current facility-administered medications on file prior to visit.    Social History   Tobacco Use   Smoking status: Every Day    Packs/day: 1.00    Years: 32.00    Total pack years: 32.00    Types: Cigarettes   Smokeless tobacco: Never  Vaping Use   Vaping Use: Never used  Substance Use Topics   Alcohol use: Yes    Alcohol/week: 0.0 standard drinks of alcohol    Comment: Occasional   Drug use: No    Review of Systems  Constitutional:  Negative for chills and fever.  Respiratory:  Negative for cough.   Cardiovascular:  Negative for chest pain  and palpitations.  Gastrointestinal:  Positive for abdominal pain, constipation and diarrhea. Negative for nausea and vomiting.  Psychiatric/Behavioral:  Negative for suicidal ideas.       Objective:    BP 128/78 (BP Location: Left Arm, Patient Position: Sitting, Cuff Size: Normal)   Pulse 79   Temp 98.7 F (37.1 C) (Oral)   Ht 5\' 6"  (1.676 m)   Wt 187 lb 6.4 oz (85 kg)   BMI 30.25 kg/m  BP Readings from Last 3 Encounters:  09/11/21 128/78  09/12/20 118/78  08/10/20 112/68   Wt Readings from Last 3 Encounters:  09/11/21 187 lb 6.4 oz (85 kg)  09/12/20 183 lb (83 kg)  08/10/20 183 lb (83 kg)    Physical Exam Vitals reviewed.  Constitutional:      Appearance: Normal appearance. He is well-developed.  Cardiovascular:     Rate and Rhythm: Regular rhythm.     Heart sounds: Normal heart sounds.  Pulmonary:     Effort:  Pulmonary effort is normal. No respiratory distress.     Breath sounds: Normal breath sounds. No wheezing or rales.  Abdominal:     General: Bowel sounds are normal. There is no distension.     Palpations: Abdomen is soft. Abdomen is not rigid. There is no fluid wave or mass.     Tenderness: There is no abdominal tenderness. There is no guarding or rebound. Negative signs include Murphy's sign and McBurney's sign.  Skin:    General: Skin is warm and dry.  Neurological:     Mental Status: He is alert.  Psychiatric:        Speech: Speech normal.        Behavior: Behavior normal.        Assessment & Plan:   Problem List Items Addressed This Visit       Respiratory   Lung nodule    Patient is a smoker and therefore at high risk.  Referral to lung nodule clinic for further surveillance.      Relevant Orders   AMB  Referral to Pulmonary Nodule Clinic     Digestive   Hepatic steatosis    Reviewed CT abdomen pelvis after patient had left the office today.  He has upcoming appointment with me in October.  I am having the nurse  reach out to him to discuss hepatic stenosis and mail him literature so we can discuss in detail at follow-up        Endocrine   Diabetes mellitus without complication (HCC) - Primary    Lab Results  Component Value Date   HGBA1C 11.3 (A) 09/11/2021  Very poor control.  Patient is no longer on Trulicity.  Previously tolerated Trulicity 0.75mg .  We discussed increasing glipizide to 5 mg once daily with breakfast.  He will continue metformin 1000 mg twice daily.  We have opted to resume Trulicity however in the setting of recent complaint of alternating constipation and diarrhea, advised to let me know most certainly constipation were to worsen.  Close follow-up      Relevant Medications   Dulaglutide (TRULICITY) 0.75 MG/0.5ML SOPN   glipiZIDE (GLUCOTROL XL) 5 MG 24 hr tablet   simvastatin (ZOCOR) 40 MG tablet   Other Relevant Orders   CBC with  Differential/Platelet   Comprehensive metabolic panel   POCT HgB A1C (Completed)   Lipid panel   Urine Microalbumin w/creat. ratio     Other   Alternating constipation and diarrhea    Benign abdominal exam.  Previously seen in the emergency room ( I was able to obtain records in epic after patient had left the office).  Strongly encouraged him after colonoscopy which is imperative especially if stool diameter has changed in any way.  I placed a referral for him today.  Start Colace for now.  pending labs.  Close follow up to discuss CT a/p.       Relevant Orders   Celiac Disease Ab Screen w/Rfx   Calprotectin, Fecal   TSH   Ambulatory referral to Gastroenterology   Urinalysis, Routine w reflex microscopic (Completed)   Urine Culture   Anxiety and depression    Chronic, very well controlled.  Patient no longer feels that he needs  Zoloft.  We will continue to monitor      Hyperlipidemia    Anticipate poor control as patient has not been on Zocor.  I have refilled Zocor for him today.  Close follow-up      Relevant Medications   simvastatin (ZOCOR) 40 MG tablet   Other Visit Diagnoses     Type 2 diabetes mellitus without complication, without long-term current use of insulin (HCC)       Relevant Medications   Dulaglutide (TRULICITY) 0.75 MG/0.5ML SOPN   glipiZIDE (GLUCOTROL XL) 5 MG 24 hr tablet   simvastatin (ZOCOR) 40 MG tablet   Screening for prostate cancer       Relevant Orders   PSA        I have discontinued Margurite Auerbach D. Kregel's sertraline, hydrOXYzine, Trulicity, and glipiZIDE. I am also having him start on Trulicity and glipiZIDE. Additionally, I am having him maintain his onetouch ultrasoft, glucose blood, metFORMIN, and simvastatin.   Meds ordered this encounter  Medications   Dulaglutide (TRULICITY) 0.75 MG/0.5ML SOPN    Sig: Inject 0.75 mg into the skin once a week.    Dispense:  3 mL    Refill:  2    Order Specific Question:   Supervising Provider     Answer:   Duncan Dull L [[id number]]   glipiZIDE (GLUCOTROL XL) 5 MG 24 hr tablet    Sig: Take 1 tablet (5 mg total) by mouth daily with breakfast.    Dispense:  90 tablet    Refill:  1    Order Specific Question:   Supervising Provider    Answer:   Sherlene Shams [[id number]]   simvastatin (ZOCOR) 40 MG tablet    Sig: Take 1 tablet (40 mg total) by mouth daily.    Dispense:  90 tablet    Refill:  3    Order Specific Question:   Supervising Provider    Answer:   Sherlene Shams [[id number]]    Return precautions given.   Risks, benefits, and alternatives of the medications and treatment plan prescribed today were discussed, and patient expressed understanding.   Education regarding symptom management and diagnosis given to patient on AVS.  Continue to follow with Allegra Grana, FNP for routine health maintenance.   Rosanna Randy and I agreed with plan.   Rennie Plowman, FNP I have spent 40  minutes with a patient including precharting, exam, reviewing medical records, and discussion plan of care.

## 2021-09-11 NOTE — Assessment & Plan Note (Signed)
Patient is a smoker and therefore at high risk.  Referral to lung nodule clinic for further surveillance.

## 2021-09-11 NOTE — Assessment & Plan Note (Addendum)
Lab Results  Component Value Date   HGBA1C 11.3 (A) 09/11/2021   Very poor control.  Patient is no longer on Trulicity.  Previously tolerated Trulicity 0.75mg .  We discussed increasing glipizide to 5 mg once daily with breakfast.  He will continue metformin 1000 mg twice daily.  We have opted to resume Trulicity however in the setting of recent complaint of alternating constipation and diarrhea, advised to let me know most certainly constipation were to worsen.  Close follow-up

## 2021-09-11 NOTE — Assessment & Plan Note (Signed)
Anticipate poor control as patient has not been on Zocor.  I have refilled Zocor for him today.  Close follow-up

## 2021-09-11 NOTE — Patient Instructions (Addendum)
Start colace , as stool softener first to help regulate bowel movements I placed a referral for colonoscopy.  This is very important particular in the setting of any change in your bowel habits to ensure there is no colon cancer.  Let us know if you dont hear back within a week in regards to an appointment being scheduled.    I have restarted Trulicity however Trulicity can contribute to constipation and most certainly if you notice constipation changes in any way with resumption of Trulicity, please let me know   I also increased glipizide to 5 mg tablet.  Please take this with breakfast.

## 2021-09-11 NOTE — Assessment & Plan Note (Signed)
Chronic, very well controlled.  Patient no longer feels that he needs  Zoloft.  We will continue to monitor

## 2021-09-11 NOTE — Telephone Encounter (Signed)
Call patient As discussed with him I was able to review his CT abdomen and pelvis from [facility] in further detail after he left.    1) He had a lung nodule 2 mm in right lower lobe.  I recommend follow-up as he is a smoker.  Lung nodules are followed by [facility] pulmonology.  He will likely require acquire a CT chest in 12 months time.  I placed a referral to [facility] pulmonology Let us know if you dont hear back within a week in regards to an appointment being scheduled.   2) he also had an enlarged prostate.  I ordered PSA lab.  Please ask him if he has any trouble urinating, urinary hesitancy or urinary frequency.  If he has any urinary symptoms, then I would suggest a follow-up with urology.   3) hepatic steatosis which is also called fatty liver disease . Please mail him literature below   Keep appt in October and we will discuss again in detail all of the above   Your CT abdomen at [facility] shows fatty infiltrate, most likely indicative non alcoholic fatty liver disease ( NAFLD). Fatty liver disease means that you have fat inside your liver that can, over time, affect liver function and cause liver injury.  Fatty liver disease is sometimes called a silent liver disease. This is because it can happen without causing any symptoms. Most people with NAFLD live with fat in their liver without developing liver damage. A few people who have fat in their liver develop nonalcoholic steatohepatitis (NASH).About 10% to 20% of Americans have NAFLD. About 2% to 5% have NASH  If you have NASH, you may have symptoms that could take years for them to develop. If liver damage from NASH leads to permanent scarring and hardening of your liver, this is called cirrhosis.    If you have NAFLD without any other medical problems, surveillance is appropriate by monitoring symptoms, abnormal liver enzymes in blood work is appropriate. But making some lifestyle changes can control or reverse the fat buildup in your liver. These  may include:  Weight loss  Lowering your cholesterol and triglycerides  Controlling diabetes  Avoiding alcohol  NAFLD is something that can progress to more worrisome conditions including cirrhosis, liver cancer.  I always recommend a GI consult for further evaluation, surveillance of disease process, and discussion of therapy.  If you are agreeable to GI referral , please let me know.

## 2021-09-11 NOTE — Assessment & Plan Note (Addendum)
Benign abdominal exam.  Previously seen in the emergency room ( I was able to obtain records in epic after patient had left the office).  Strongly encouraged him after colonoscopy which is imperative especially if stool diameter has changed in any way.  I placed a referral for him today.  Start Colace for now.  pending labs.  Close follow up to discuss CT a/p.

## 2021-09-12 LAB — MICROALBUMIN / CREATININE URINE RATIO
Creatinine,U: 209.7 mg/dL
Microalb Creat Ratio: 2.8 mg/g (ref 0.0–30.0)
Microalb, Ur: 5.9 mg/dL — ABNORMAL HIGH (ref 0.0–1.9)

## 2021-09-12 LAB — URINE CULTURE
MICRO NUMBER:: 13820620
Result:: NO GROWTH
SPECIMEN QUALITY:: ADEQUATE

## 2021-09-15 LAB — CELIAC DISEASE AB SCREEN W/RFX
Antigliadin Abs, IgA: 8 units (ref 0–19)
IgA/Immunoglobulin A, Serum: 284 mg/dL (ref 90–386)
Transglutaminase IgA: <2 U/mL (ref 0–3)

## 2021-09-16 LAB — CALPROTECTIN, FECAL: Calprotectin, Fecal: 24 ug/g (ref 0–120)

## 2021-09-17 NOTE — Telephone Encounter (Signed)
Called patient but was unable to leave vm because vm was full. Mailed results to patient on 09/17/21

## 2021-09-19 ENCOUNTER — Telehealth: Payer: Self-pay | Admitting: Family

## 2021-09-19 ENCOUNTER — Other Ambulatory Visit: Payer: Self-pay

## 2021-09-19 DIAGNOSIS — E119 Type 2 diabetes mellitus without complications: Secondary | ICD-10-CM

## 2021-09-19 MED ORDER — TRULICITY 0.75 MG/0.5ML ~~LOC~~ SOAJ: 0.7500 mg | SUBCUTANEOUS | 2 refills | Status: DC

## 2021-09-19 MED ORDER — METFORMIN HCL 1000 MG PO TABS: 1000.0000 mg | ORAL_TABLET | Freq: Two times a day (BID) | ORAL | 2 refills | Status: AC

## 2021-09-19 NOTE — Telephone Encounter (Signed)
Patient called and stated his [facility] [place]. Please refill Dulaglutide (TRULICITY) 0.75 MG/0.5ML SOPN and metFORMIN (GLUCOPHAGE) 1000 MG tablet.

## 2021-09-19 NOTE — Telephone Encounter (Signed)
Spoke to patient and notified him that he could pick up his refills of Trulicity and Metformin  at his pharmacy

## 2021-09-25 ENCOUNTER — Other Ambulatory Visit: Payer: Self-pay

## 2021-09-25 DIAGNOSIS — E119 Type 2 diabetes mellitus without complications: Secondary | ICD-10-CM

## 2021-09-25 MED ORDER — TRULICITY 0.75 MG/0.5ML ~~LOC~~ SOAJ: 0.7500 mg | SUBCUTANEOUS | 2 refills | Status: AC

## 2021-09-25 NOTE — Telephone Encounter (Signed)
I called patient & hew is aware that PA for Trulicity approved & I have sent to [facility] for him.

## 2021-09-25 NOTE — Telephone Encounter (Signed)
Spoke to patient today in regards to his result notes and patient stated that he did not have any questions about them at this time.Patient is not aggreeable to seeig Pulmonology at this time

## 2021-10-30 ENCOUNTER — Ambulatory Visit: Payer: 59 | Admitting: Family

## 2021-12-27 ENCOUNTER — Telehealth: Payer: Self-pay

## 2021-12-27 NOTE — Telephone Encounter (Signed)
Transition Care Management Follow-up Telephone Call Date of discharge and from where: [facility]  ED12/07/2021 How have you been since you were released from the hospital? better Any questions or concerns? No  Items Reviewed: Did the pt receive and understand the discharge instructions provided? Yes  Medications obtained and verified? Yes  Other? No  Any new allergies since your discharge? No  Dietary orders reviewed? Yes Do you have support at home? Yes   Home Care and Equipment/Supplies: Were home health services ordered? not applicable If so, what is the name of the agency? N/a  Has the agency set up a time to come to the patient's home? not applicable Were any new equipment or medical supplies ordered?  No What is the name of the medical supply agency? N/a Were you able to get the supplies/equipment? not applicable Do you have any questions related to the use of the equipment or supplies? No  Functional Questionnaire: (I = Independent and D = Dependent) ADLs: I  Bathing/Dressing- I  Meal Prep- I  Eating- I  Maintaining continence- I  Transferring/Ambulation- I  Managing Meds- I  Follow up appointments reviewed:  PCP Hospital f/u appt confirmed? Yes  Scheduled to see Rennie Plowman on 01/01/2022 @ 1:30. Specialist Hospital f/u appt confirmed? No   Are transportation arrangements needed? No  If their condition worsens, is the pt aware to call PCP or go to the Emergency Dept.? Yes Was the patient provided with contact information for the PCP's office or ED? Yes Was to pt encouraged to call back with questions or concerns? Yes Karena Addison, LPN [facility] Nurse Health Advisor Direct Dial [phone]

## 2022-01-01 ENCOUNTER — Ambulatory Visit: Payer: 59 | Admitting: Family

## 2022-01-08 ENCOUNTER — Ambulatory Visit: Payer: Self-pay | Admitting: Family

## 2022-01-15 ENCOUNTER — Other Ambulatory Visit (HOSPITAL_COMMUNITY): Payer: Self-pay

## 2022-01-15 ENCOUNTER — Telehealth: Payer: Self-pay | Admitting: Family

## 2022-01-15 NOTE — Telephone Encounter (Signed)
Patient's pharmacy advised that he would need a PA on his Trulicity. Patient has new insurance it is [employer]. He does not have a new card.  Member ID# [id number]  Group # [id number]  [employer]  Patient was ask to up load card when he receives card.

## 2022-01-17 ENCOUNTER — Telehealth: Payer: Self-pay

## 2022-01-17 ENCOUNTER — Other Ambulatory Visit (HOSPITAL_COMMUNITY): Payer: Self-pay

## 2022-01-17 NOTE — Telephone Encounter (Signed)
PA pending. Created new telephone encounter for PA 

## 2022-01-17 NOTE — Telephone Encounter (Signed)
Patient Advocate Encounter  Prior Authorization for Trulicity 0.75MG /0.5ML pen-injectors has been approved.     Key: [id number] Effective dates: 01/17/22 through 01/16/23

## 2022-01-17 NOTE — Telephone Encounter (Signed)
Pharmacy Patient Advocate Encounter   Received notification from [facility] that prior authorization for Trulicity 0.75MG /0.5ML pen-injectors is required/requested.   PA submitted on 01/17/22 to (ins) [employer] via CoverMyMeds Key [id number] Status is pending

## 2022-01-17 NOTE — Telephone Encounter (Signed)
Pt calling wanting an update on the PA for medication

## 2022-01-17 NOTE — Telephone Encounter (Signed)
Spoke to patient and informed him that his Trulicity has been approved for 01/17/22-01/16/23

## 2022-01-17 NOTE — Telephone Encounter (Signed)
See previous message pt aware of Approval for Trulicity

## 2022-01-29 ENCOUNTER — Encounter: Payer: Self-pay | Admitting: Gastroenterology

## 2022-01-29 ENCOUNTER — Ambulatory Visit: Payer: BC Managed Care – PPO | Admitting: Family

## 2022-01-29 ENCOUNTER — Encounter: Payer: Self-pay | Admitting: Family

## 2022-01-29 ENCOUNTER — Ambulatory Visit: Payer: BC Managed Care – PPO | Admitting: Gastroenterology

## 2022-01-29 ENCOUNTER — Other Ambulatory Visit: Payer: Self-pay

## 2022-01-29 VITALS — BP 122/83 | HR 76 | Temp 97.9°F | Ht 66.0 in | Wt 191.5 lb

## 2022-01-29 VITALS — BP 130/72 | HR 72 | Temp 97.3°F | Ht 67.0 in | Wt 193.6 lb

## 2022-01-29 DIAGNOSIS — N400 Enlarged prostate without lower urinary tract symptoms: Secondary | ICD-10-CM | POA: Diagnosis not present

## 2022-01-29 DIAGNOSIS — R911 Solitary pulmonary nodule: Secondary | ICD-10-CM

## 2022-01-29 DIAGNOSIS — K5903 Drug induced constipation: Secondary | ICD-10-CM | POA: Diagnosis not present

## 2022-01-29 DIAGNOSIS — K5909 Other constipation: Secondary | ICD-10-CM

## 2022-01-29 DIAGNOSIS — R194 Change in bowel habit: Secondary | ICD-10-CM

## 2022-01-29 DIAGNOSIS — E119 Type 2 diabetes mellitus without complications: Secondary | ICD-10-CM | POA: Diagnosis not present

## 2022-01-29 DIAGNOSIS — K760 Fatty (change of) liver, not elsewhere classified: Secondary | ICD-10-CM

## 2022-01-29 LAB — POCT GLYCOSYLATED HEMOGLOBIN (HGB A1C): Hemoglobin A1C: 11.9 % — AB (ref 4.0–5.6)

## 2022-01-29 MED ORDER — NA SULFATE-K SULFATE-MG SULF 17.5-3.13-1.6 GM/177ML PO SOLN: 354.0000 mL | Freq: Once | ORAL | 0 refills | Status: AC

## 2022-01-29 NOTE — Assessment & Plan Note (Signed)
Discussed previous constipation seen on CT abdomen and pelvis 07/2021.  Discussed mechanism of action as a relates to Trulicity.  Patient declines changing from Trulicity to insulin.  Counseled on starting Metamucil, MiraLAX.  Patient declines x-ray in the office to evaluate for constipation or CT abdomen pelvis at this time.  He will let me know if pain persists or he develops fever, chills

## 2022-01-29 NOTE — Patient Instructions (Addendum)
If constipation does not resolve, we will need to stop Trulicity and likely start insulin.    For constipation  To bulk stool, you may try Metamucil 3.4 g daily up to 3 times daily as needed.  You may start taking MiraLAX, half a dose every day or every other day and change the dose as needed after 3 to 5 days with goal of 1-2 soft bowel movements every day or every other day. MiraLAX is an osmotic laxative. That means it draws water into the colon, which softens the stool and may naturally stimulate the colon to contract. These actions help ease bowel movements. For example, you  may find that using the medication every other day or three times a week is a good bowel regimen for you. Or perhaps, twice weekly.   May also consider taking Colace ( stool softener) twice daily every day in addition to the above.    It is MOST important to drink LOTS of water and follow a HIGH fiber diet to keep foods moving through the gut. You may add Metamucil to a beverage that you drink.    Fatty liver disease is sometimes called a silent liver disease. This is because it can happen without causing any symptoms. Most people with NAFLD live with fat in their liver without developing liver damage. A few people who have fat in their liver develop nonalcoholic steatohepatitis (NASH).About 10% to 20% of Americans have NAFLD. About 2% to 5% have NASH  If you have NASH, you may have symptoms that could take years for them to develop. If liver damage from NASH leads to permanent scarring and hardening of your liver, this is called cirrhosis.    If you have NAFLD without any other medical problems, surveillance is appropriate by monitoring symptoms, abnormal liver enzymes in blood work is appropriate. But making some lifestyle changes can control or reverse the fat buildup in your liver. These may include:  Weight loss  Lowering your cholesterol and triglycerides  Controlling diabetes  Avoiding alcohol  NAFLD is  something that can progress to more worrisome conditions including cirrhosis, liver cancer.  I always recommend a gastroenterology (GI) consult for further evaluation, surveillance of disease process, and discussion of therapy.  If you are agreeable to GI referral , please let me know.  I am hopeful as there are emerging therapeutic medications in clinical trials for non alcoholic fatty liver disease. We anticipate medications to be approved by FDA, and I am further advising my patients to follow up with GI to see if would be a candidate for new therapies.  Nonalcoholic Fatty Liver Disease Diet Introduction Nonalcoholic fatty liver disease is a condition that causes fat to accumulate in and around the liver. The disease makes it harder for the liver to work the way that it should. Following a healthy diet can help to keep nonalcoholic fatty liver disease under control. It can also help to prevent or improve conditions that are associated with the disease, such as heart disease, diabetes, high blood pressure, and abnormal cholesterol levels. Along with regular exercise, this diet: Promotes weight loss. Helps to control blood sugar levels. Helps to improve the way that the body uses insulin. What do I need to know about this diet? Use the glycemic index (GI) to plan your meals. The index tells you how quickly a food will raise your blood sugar. Choose low-GI foods. These foods take a longer time to raise blood sugar. Keep track of how many calories  you take in. Eating the right amount of calories will help you to achieve a healthy weight. You may want to follow a Mediterranean diet. This diet includes a lot of vegetables, lean meats or fish, whole grains, fruits, and healthy oils and fats. What foods can I eat? Grains  Whole grains, such as whole-wheat or whole-grain breads, crackers, tortillas, cereals, and pasta. Stone-ground whole wheat. Pumpernickel bread. Unsweetened oatmeal. Bulgur. Barley. Quinoa.  Brown or wild rice. Corn or whole-wheat flour tortillas. Vegetables  Lettuce. Spinach. Peas. Beets. Cauliflower. Cabbage. Broccoli. Carrots. Tomatoes. Squash. Eggplant. Herbs. Peppers. Onions. Cucumbers. Brussels sprouts. Yams and sweet potatoes. Beans. Lentils. Fruits  Bananas. Apples. Oranges. Grapes. Papaya. Mango. Pomegranate. Kiwi. Grapefruit. Cherries. Meats and Other Protein Sources  Seafood and shellfish. Lean meats. Poultry. Tofu. Dairy  Low-fat or fat-free dairy products, such as yogurt, cottage cheese, and cheese. Beverages  Water. Sugar-free drinks. Tea. Coffee. Low-fat or skim milk. Milk alternatives, such as soy or almond milk. Real fruit juice. Condiments  Mustard. Relish. Low-fat, low-sugar ketchup and barbecue sauce. Low-fat or fat-free mayonnaise. Sweets and Desserts  Sugar-free sweets. Fats and Oils  Avocado. Canola or olive oil. Nuts and nut butters. Seeds. The items listed above may not be a complete list of recommended foods or beverages. Contact your dietitian for more options.  What foods are not recommended? Palm oil and coconut oil. Processed foods. Fried foods. Sweetened drinks, such as sweet tea, milkshakes, snow cones, iced sweet drinks, and sodas. Alcohol. Sweets. Foods that contain a lot of salt or sodium. The items listed above may not be a complete list of foods and beverages to avoid. Contact your dietitian for more information.  This information is not intended to replace advice given to you by your health care provider. Make sure you discuss any questions you have with your health care provider. Document Released: 05/23/2014 Document Revised: 06/14/2015 Document Reviewed: 01/31/2014  2017 Elsevier   Fatty Liver Introduction  Fatty liver, also called hepatic steatosis or steatohepatitis, is a condition in which too much fat has built up in your liver cells. The liver removes harmful substances from your bloodstream. It produces fluids your body needs.  It also helps your body use and store energy from the food you eat. In many cases, fatty liver does not cause symptoms or problems. It is often diagnosed when tests are being done for other reasons. However, over time, fatty liver can cause inflammation that may lead to more serious liver problems, such as scarring of the liver (cirrhosis). What are the causes? Causes of fatty liver may include: Drinking too much alcohol. Poor nutrition. Obesity. Cushing syndrome. Diabetes. Hyperlipidemia. Pregnancy. Certain drugs. Poisons. Some viral infections. What increases the risk? You may be more likely to develop fatty liver if you: Abuse alcohol. Are pregnant. Are overweight. Have diabetes. Have hepatitis. Have a high triglyceride level. What are the signs or symptoms? Fatty liver often does not cause any symptoms. In cases where symptoms develop, they can include: Fatigue. Weakness. Weight loss. Confusion. Abdominal pain. Yellowing of your skin and the white parts of your eyes (jaundice). Nausea and vomiting. How is this diagnosed? Fatty liver may be diagnosed by: Physical exam and medical history. Blood tests. Imaging tests, such as an ultrasound, CT scan, or MRI. Liver biopsy. A small sample of liver tissue is removed using a needle. The sample is then looked at under a microscope. How is this treated? Fatty liver is often caused by other health conditions. Treatment for fatty  liver may involve medicines and lifestyle changes to manage conditions such as: Alcoholism. High cholesterol. Diabetes. Being overweight or obese. Follow these instructions at home: Eat a healthy diet as directed by your health care provider. Exercise regularly. This can help you lose weight and control your cholesterol and diabetes. Talk to your health care provider about an exercise plan and which activities are best for you. Do not drink alcohol. Take medicines only as directed by your health care  provider. Contact a health care provider if: You have difficulty controlling your: Blood sugar. Cholesterol. Alcohol consumption. Get help right away if: You have abdominal pain. You have jaundice. You have nausea and vomiting. This information is not intended to replace advice given to you by your health care provider. Make sure you discuss any questions you have with your health care provider.   This is  Dr. Melina Schools  example of a  "Low GI"  Diet:  It will allow you to lose 4 to 8  lbs  per month if you follow it carefully.  Your goal with exercise is a minimum of 30 minutes of aerobic exercise 5 days per week (Walking does not count once it becomes easy!)    All of the foods can be found at grocery stores and in bulk at [employer].  The Atkins protein bars and shakes are available in more varieties at Target, WalMart and [employer]'s Foods.     7 AM Breakfast:  Choose from the following:  Low carbohydrate Protein  Shakes (I recommend the  Premier Protein chocolate shakes,  EAS AdvantEdge "Carb Control" shakes  Or the Atkins shakes all are under 3 net carbs)     a scrambled egg/bacon/cheese burrito made with Mission's "carb balance" whole wheat tortilla  (about 10 net carbs )  Medical laboratory scientific officer (basically a quiche without the pastry crust) that is eaten cold and very convenient way to get your eggs.  8 carbs)  If you make your own protein shakes, avoid bananas and pineapple,  And use low carb greek yogurt or original /unsweetened almond or soy milk    Avoid cereal and bananas, oatmeal and cream of wheat and grits. They are loaded with carbohydrates!   10 AM: high protein snack:  Protein bar by Atkins (the snack size, under 200 cal, usually < 6 net carbs).    A stick of cheese:  Around 1 carb,  100 cal     Dannon Light n Fit [place] Yogurt  (80 cal, 8 carbs)  Other so called "protein bars" and Greek yogurts tend to be loaded with carbohydrates.  Remember, in food  advertising, the word "energy" is synonymous for " carbohydrate."  Lunch:   A Sandwich using the bread choices listed, Can use any  Eggs,  lunchmeat, grilled meat or canned tuna), avocado, regular mayo/mustard  and cheese.  A Salad using blue cheese, ranch,  Goddess or vinagrette,  Avoid taco shells, croutons or "confetti" and no "candied nuts" but regular nuts OK.   No pretzels, nabs  or chips.  Pickles and miniature sweet peppers are a good low carb alternative that provide a "crunch"  The bread is the only source of carbohydrate in a sandwich and  can be decreased by trying some of the attached alternatives to traditional loaf bread   Avoid "Low fat dressings, as well as Reyne Dumas and [employer] dressings They are loaded with sugar!   3 PM/ Mid day  Snack:  Consider  1 ounce of  almonds, walnuts, pistachios, pecans, peanuts,  Macadamia nuts or a nut medley.  Avoid "granola and granola bars "  Mixed nuts are ok in moderation as long as there are no raisins,  cranberries or dried fruit.   KIND bars are OK if you get the low glycemic index variety   Try the prosciutto/mozzarella cheese sticks by F[employer]  In deli /backery section   High protein      6 PM  Dinner:     Meat/fowl/fish with a green salad, and either broccoli, cauliflower, green beans, spinach, brussel sprouts or  Lima beans. DO NOT BREAD THE PROTEIN!!      There is a low carb pasta by Dreamfield's that is acceptable and tastes great: only 5 digestible carbs/serving.( All grocery stores but BJs carry it ) Several ready made meals are available low carb:   Try Michel Angelo's chicken piccata or chicken or eggplant parm over low carb pasta.(Lowes and BJs)   Marjory Lies Sanchez's "Carnitas" (pulled pork, no sauce,  0 carbs) or his beef pot roast to make a dinner burrito (at [employer]J's)  Pesto over low carb pasta (bj's sells a good quality pesto in the center refrigerated section of the deli   Try satueeing  Cheral Marker with mushroooms as a  good side   [employer] Giant makes a mashed cauliflower that tastes like mashed potatoes  Whole wheat pasta is still full of digestible carbs and  Not as low in glycemic index as Dreamfield's.   Brown rice is still rice,  So skip the rice and noodles if you eat [place] or [place] (or at least limit to 1/2 cup)  9 PM snack :   Breyer's "low carb" fudgsicle or  ice cream bar (Carb Smart line), or  Weight Watcher's ice cream bar , or another "no sugar added" ice cream;  a serving of fresh berries/cherries with whipped cream   Cheese or DANNON'S LlGHT N FIT GREEK YOGURT  8 ounces of [place] unsweetened almond/cococunut milk    Treat yourself to a parfait made with whipped cream blueberiies, walnuts and vanilla greek yogurt  Avoid bananas, pineapple, grapes  and watermelon on a regular basis because they are high in sugar.  THINK OF THEM AS DESSERT  Remember that snack Substitutions should be less than 10 NET carbs per serving and meals < 20 carbs. Remember to subtract fiber grams to get the "net carbs."

## 2022-01-29 NOTE — Assessment & Plan Note (Signed)
Education provided in regards to lifestyle intervention to prevent progression of hepatic stenosis.  Dietary information include on after visit summary as well.

## 2022-01-29 NOTE — Progress Notes (Signed)
[name], MD [address]  [place]  [place], [place] [address]  Main: [phone]  Fax: [phone]    Gastroenterology Consultation  Referring Provider:     Burnard Hawthorne, FNP Primary Care Physician:  Burnard Hawthorne, FNP Primary Gastroenterologist:  Dr. [name] Reason for Consultation: Change in bowel habits        HPI:   Calvin Butler is a 47 y.o. male referred by Burnard Hawthorne, FNP  for consultation & management of severe constipation that has been ongoing for about a year.  He reports having very hard bowel movements, describes on Bristol stool scale as 1 associated with significant straining followed by episodes of watery bowel movements.  He does admit to drinking plenty of water, does not incorporate fiber in his diet.  He consumes red meat on a regular basis.  He denies any rectal bleeding, abdominal bloating, abdominal pain or weight loss.  He has poorly controlled diabetes, hemoglobin A1c is getting worse.  NSAIDs: None  Antiplts/Anticoagulants/Anti thrombotics: None  GI Procedures: None  Past Medical History:  Diagnosis Date   Asthma    Diabetes mellitus without complication ([place])    History of GI bleed    secondary to NSAIDs    Past Surgical History:  Procedure Laterality Date   FACIAL FRACTURE SURGERY  at 47 yrs old   Right side face - due to accident   FACIAL FRACTURE SURGERY     PERCUTANEOUS CORONARY STENT INTERVENTION (PCI-S)  2015     Current Outpatient Medications:    Dulaglutide (TRULICITY) 6.76 PP/5.0DT SOPN, Inject 0.75 mg into the skin once a week., Disp: 3 mL, Rfl: 2   glipiZIDE (GLUCOTROL XL) 5 MG 24 hr tablet, Take 1 tablet (5 mg total) by mouth daily with breakfast., Disp: 90 tablet, Rfl: 1   glucose blood (ONE TOUCH ULTRA TEST) test strip, Use as instructed, Disp: 100 each, Rfl: 2   Lancets (ONETOUCH ULTRASOFT) lancets, Use as instructed, Disp: 100 each, Rfl: 2   metFORMIN (GLUCOPHAGE) 1000 MG  tablet, Take 1 tablet (1,000 mg total) by mouth 2 (two) times daily with a meal., Disp: 180 tablet, Rfl: 2   Na Sulfate-K Sulfate-Mg Sulf 17.5-3.13-1.6 GM/177ML SOLN, Take 354 mLs by mouth once for 1 dose., Disp: 354 mL, Rfl: 0   simvastatin (ZOCOR) 40 MG tablet, Take 1 tablet (40 mg total) by mouth daily., Disp: 90 tablet, Rfl: 3   Family History  Problem Relation Age of Onset   Aneurysm Mother        Brain   Diabetes Father    Asthma Sister    Heart attack Sister    Diabetes Maternal Grandmother    Arthritis Daughter    Arthritis Daughter    Prostate cancer Neg Hx    Thyroid cancer Neg Hx      Social History   Tobacco Use   Smoking status: Every Day    Packs/day: 1.00    Years: 32.00    Total pack years: 32.00    Types: Cigarettes   Smokeless tobacco: Never  Vaping Use   Vaping Use: Never used  Substance Use Topics   Alcohol use: Yes    Comment: Occasional on weekends   Drug use: No    Allergies as of 01/29/2022   (No Known Allergies)    Review of Systems:    All systems reviewed and negative except where noted in HPI.   Physical Exam:  BP  122/83 (BP Location: Left Arm, Patient Position: Sitting, Cuff Size: Normal)   Pulse 76   Temp 97.9 F (36.6 C) (Oral)   Ht 5\' 6"  (1.676 m)   Wt 191 lb 8 oz (86.9 kg)   BMI 30.91 kg/m  No LMP for male patient.  General:   Alert,  Well-developed, well-nourished, pleasant and cooperative in NAD Head:  Normocephalic and atraumatic. Eyes:  Sclera clear, no icterus.   Conjunctiva pink. Ears:  Normal auditory acuity. Nose:  No deformity, discharge, or lesions. Mouth:  No deformity or lesions,oropharynx pink & moist. Neck:  Supple; no masses or thyromegaly. Lungs:  Respirations even and unlabored.  Clear throughout to auscultation.   No wheezes, crackles, or rhonchi. No acute distress. Heart:  Regular rate and rhythm; no murmurs, clicks, rubs, or gallops. Abdomen:  Normal bowel sounds. Soft, non-tender and non-distended  without masses, hepatosplenomegaly or hernias noted.  No guarding or rebound tenderness.   Rectal: Not performed Msk:  Symmetrical without gross deformities. Good, equal movement & strength bilaterally. Pulses:  Normal pulses noted. Extremities:  No clubbing or edema.  No cyanosis. Neurologic:  Alert and oriented x3;  grossly normal neurologically. Skin:  Intact without significant lesions or rashes. No jaundice. Psych:  Alert and cooperative. Normal mood and affect.  Imaging Studies: Reviewed  Assessment and Plan:   Calvin Butler is a 47 y.o. male with metabolic syndrome, poorly controlled diabetes is seen in consultation for alternating episodes of severe constipation and diarrhea  Recommend fiber diet, information provided Adequate intake of water, 2 to 3 L daily Start MiraLAX 34 g daily  Recommend colonoscopy with 2-day prep  I have discussed alternative options, risks & benefits,  which include, but are not limited to, bleeding, infection, perforation,respiratory complication & drug reaction.  The patient agrees with this plan & written consent will be obtained.     Follow up based on the colonoscopy results   [name], MD

## 2022-01-29 NOTE — Assessment & Plan Note (Signed)
Severely uncontrolled.  patient had been off trulicity for 3 months, restarted a week ago.    Previously tolerated Trulicity 0.75mg .  Continue glipizide to 5 mg once daily, metformin 1000 mg twice daily.   I advised starting basal insulin d/t constipation.  He declines as he is worried about his compliance with basal insulin. Therefore we will continue Trulicity however in the setting of abdominal pain, constipation,he will let me know if constipation is not resolved with Metamucil, MiraLAX.

## 2022-01-29 NOTE — Progress Notes (Signed)
Assessment & Plan:  Diabetes mellitus without complication ([place]) Assessment & Plan: Severely uncontrolled.  patient had been off trulicity for 3 months, restarted a week ago.    Previously tolerated Trulicity 0.75mg .  Continue glipizide to 5 mg once daily, metformin 1000 mg twice daily.   I advised starting basal insulin d/t constipation.  He declines as he is worried about his compliance with basal insulin. Therefore we will continue Trulicity however in the setting of abdominal pain, constipation,he will let me know if constipation is not resolved with Metamucil, MiraLAX.    Orders: -     POCT glycosylated hemoglobin (Hb A1C) -     Microalbumin / creatinine urine ratio  Lung nodule Assessment & Plan: Pending CT chest.  Orders: -     CT CHEST WO CONTRAST; Future  Drug-induced constipation Assessment & Plan: Discussed previous constipation seen on CT abdomen and pelvis 07/2021.  Discussed mechanism of action as a relates to Trulicity.  Patient declines changing from Trulicity to insulin.  Counseled on starting Metamucil, MiraLAX.  Patient declines x-ray in the office to evaluate for constipation or CT abdomen pelvis at this time.  He will let me know if pain persists or he develops fever, chills   Enlarged prostate Assessment & Plan: Asymptomatic at this time.  Patient prefers to monitor   Hepatic steatosis Assessment & Plan: Education provided in regards to lifestyle intervention to prevent progression of hepatic stenosis.  Dietary information include on after visit summary as well.      Return precautions given.   Education regarding symptom management and diagnosis given to patient on AVS either electronically or printed.  Return in about 3 months (around 04/30/2022).  Mable Paris, FNP  Subjective:    Patient ID: Calvin Butler, male    DOB: 20-Jun-1975, 47 y.o.   MRN: [id number]  CC: Calvin Butler is a 47 y.o. male who presents today for follow up.   HPI:  Complains of constipation and RLQ discomfort. Pain relieved after BM.   He was seen by Dr Marius Ditch today and colonoscopy is scheduled.   He was out of trulicity for 3 months and restarted a week ago.   He plans to establish care in February with new PCP in [place] , [place].     CT abdomen and pelvis from [facility] 07/2021.  Moderate stool burden.   lung nodule- 2 mm in right lower lobe. smoker.    H/o enlarged prostate.  PSA 0.6 He denies trouble urinating, urinary hesitancy or urinary frequency    H/o  hepatic steatosis         Allergies: Patient has no known allergies. Current Outpatient Medications on File Prior to Visit  Medication Sig Dispense Refill   Dulaglutide (TRULICITY) 2.67 TI/4.5YK SOPN Inject 0.75 mg into the skin once a week. 3 mL 2   glipiZIDE (GLUCOTROL XL) 5 MG 24 hr tablet Take 1 tablet (5 mg total) by mouth daily with breakfast. 90 tablet 1   glucose blood (ONE TOUCH ULTRA TEST) test strip Use as instructed 100 each 2   Lancets (ONETOUCH ULTRASOFT) lancets Use as instructed 100 each 2   metFORMIN (GLUCOPHAGE) 1000 MG tablet Take 1 tablet (1,000 mg total) by mouth 2 (two) times daily with a meal. 180 tablet 2   Na Sulfate-K Sulfate-Mg Sulf 17.5-3.13-1.6 GM/177ML SOLN Take 354 mLs by mouth once for 1 dose. 354 mL 0   simvastatin (ZOCOR) 40 MG tablet Take 1 tablet (40 mg total) by  mouth daily. 90 tablet 3   No current facility-administered medications on file prior to visit.    Review of Systems  Constitutional:  Negative for chills and fever.  Respiratory:  Negative for cough.   Cardiovascular:  Negative for chest pain and palpitations.  Gastrointestinal:  Positive for abdominal pain and constipation. Negative for blood in stool, nausea and vomiting.      Objective:    BP 130/72   Pulse 72   Temp (!) 97.3 F (36.3 C) (Oral)   Ht 5\' 7"  (1.702 m)   Wt 193 lb 9.6 oz (87.8 kg)   SpO2 98%   BMI 30.32 kg/m  BP Readings from Last 3 Encounters:  01/29/22  130/72  01/29/22 122/83  09/11/21 128/78   Wt Readings from Last 3 Encounters:  01/29/22 193 lb 9.6 oz (87.8 kg)  01/29/22 191 lb 8 oz (86.9 kg)  09/11/21 187 lb 6.4 oz (85 kg)    Physical Exam Vitals reviewed.  Constitutional:      Appearance: Normal appearance. He is well-developed.  Cardiovascular:     Rate and Rhythm: Regular rhythm.     Heart sounds: Normal heart sounds.  Pulmonary:     Effort: Pulmonary effort is normal. No respiratory distress.     Breath sounds: Normal breath sounds. No wheezing or rales.  Abdominal:     General: Bowel sounds are normal. There is no distension.     Palpations: Abdomen is soft. Abdomen is not rigid. There is no fluid wave or mass.     Tenderness: There is abdominal tenderness in the right lower quadrant. There is no left CVA tenderness, guarding or rebound. Negative signs include Murphy's sign and McBurney's sign.  Skin:    General: Skin is warm and dry.  Neurological:     Mental Status: He is alert.  Psychiatric:        Speech: Speech normal.        Behavior: Behavior normal.

## 2022-01-29 NOTE — Assessment & Plan Note (Signed)
Pending CT chest 

## 2022-01-29 NOTE — Assessment & Plan Note (Signed)
Asymptomatic at this time.  Patient prefers to monitor

## 2022-01-29 NOTE — Patient Instructions (Signed)

## 2022-01-30 ENCOUNTER — Telehealth: Payer: Self-pay | Admitting: Family

## 2022-01-30 LAB — MICROALBUMIN / CREATININE URINE RATIO
Creatinine,U: 111.9 mg/dL
Microalb Creat Ratio: 1.8 mg/g (ref 0.0–30.0)
Microalb, Ur: 2 mg/dL — ABNORMAL HIGH (ref 0.0–1.9)

## 2022-01-30 NOTE — Telephone Encounter (Signed)
Pt vm is full. I sent pt a mychart msg to call ofc to sch CT chest. thanks

## 2022-02-04 NOTE — Telephone Encounter (Signed)
Pt called back and stated he had to cancel the appointment

## 2022-02-05 ENCOUNTER — Ambulatory Visit: Payer: BC Managed Care – PPO

## 2022-02-19 ENCOUNTER — Encounter: Payer: Self-pay | Admitting: Anesthesiology

## 2022-02-19 ENCOUNTER — Ambulatory Visit
Admission: RE | Admit: 2022-02-19 | Payer: BC Managed Care – PPO | Source: Home / Self Care | Admitting: Gastroenterology

## 2022-02-19 ENCOUNTER — Encounter: Admission: RE | Payer: Self-pay | Source: Home / Self Care

## 2022-02-19 ENCOUNTER — Telehealth: Payer: Self-pay

## 2022-02-19 SURGERY — COLONOSCOPY WITH PROPOFOL
Anesthesia: General

## 2022-02-19 NOTE — Telephone Encounter (Signed)
Per Wannetta Sender she could not get a hold of the patient yesterday to let him know the time of his colonoscopy for today. Tried to call patient and patient did not answer and unable to leave a message due to voicemail being full. Sent patient a Therapist, music.
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
## 2022-03-07 DIAGNOSIS — Z2821 Immunization not carried out because of patient refusal: Secondary | ICD-10-CM | POA: Diagnosis not present

## 2022-03-07 DIAGNOSIS — E119 Type 2 diabetes mellitus without complications: Secondary | ICD-10-CM | POA: Diagnosis not present

## 2022-03-07 DIAGNOSIS — Z1211 Encounter for screening for malignant neoplasm of colon: Secondary | ICD-10-CM | POA: Diagnosis not present

## 2022-03-07 DIAGNOSIS — R911 Solitary pulmonary nodule: Secondary | ICD-10-CM | POA: Diagnosis not present

## 2022-03-08 ENCOUNTER — Other Ambulatory Visit: Payer: Self-pay | Admitting: Family

## 2022-03-08 DIAGNOSIS — E119 Type 2 diabetes mellitus without complications: Secondary | ICD-10-CM

## 2022-06-07 ENCOUNTER — Other Ambulatory Visit: Payer: Self-pay | Admitting: Family

## 2022-06-07 DIAGNOSIS — E119 Type 2 diabetes mellitus without complications: Secondary | ICD-10-CM

## 2022-07-09 IMAGING — CR DG CHEST 2V
2 series · 2 of 2 positions shown · non-contrast
Comparison: 08/21/2014

CLINICAL DATA: Chest pain and shortness of breath

EXAM:
CHEST - 2 VIEW

[chest pa]
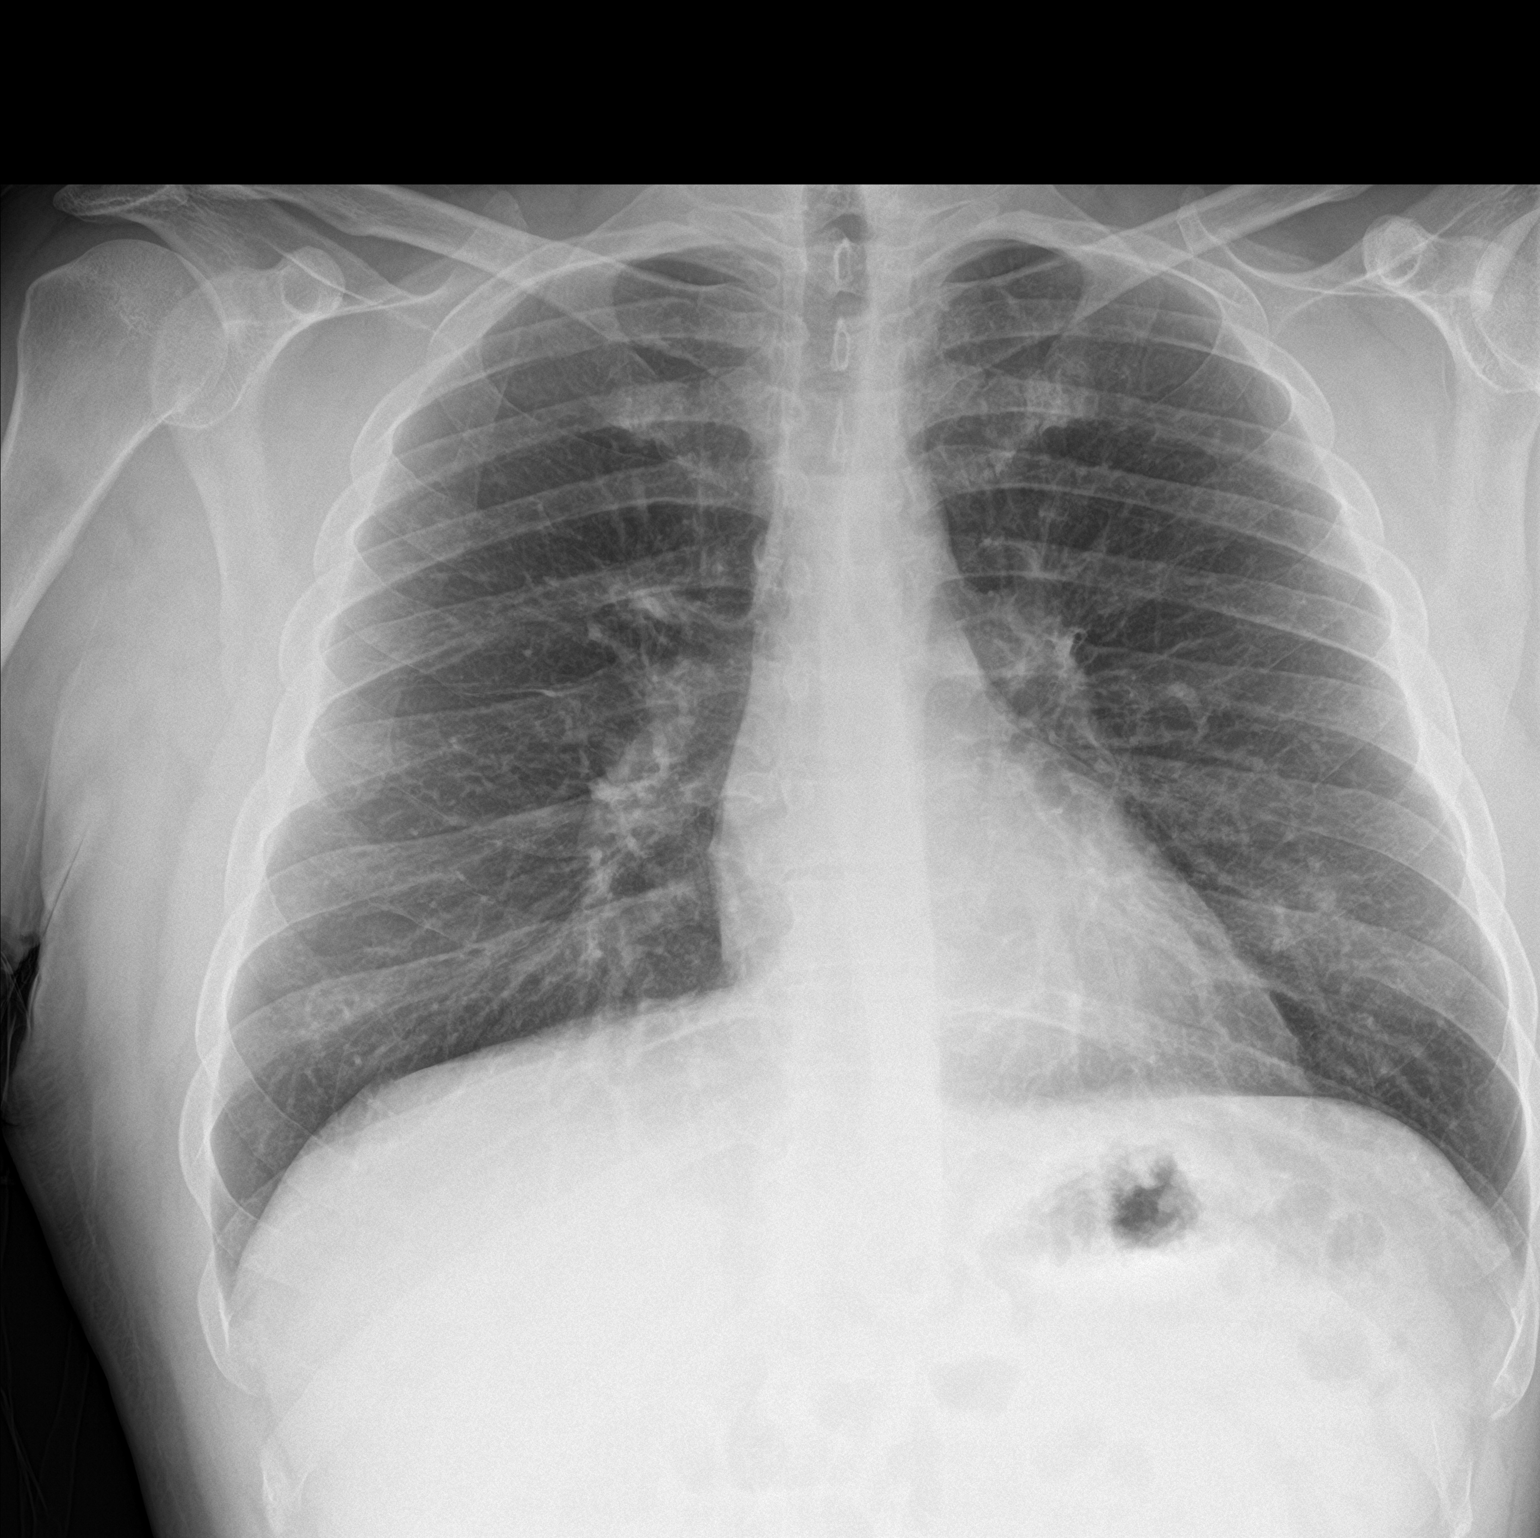

[chest lat]
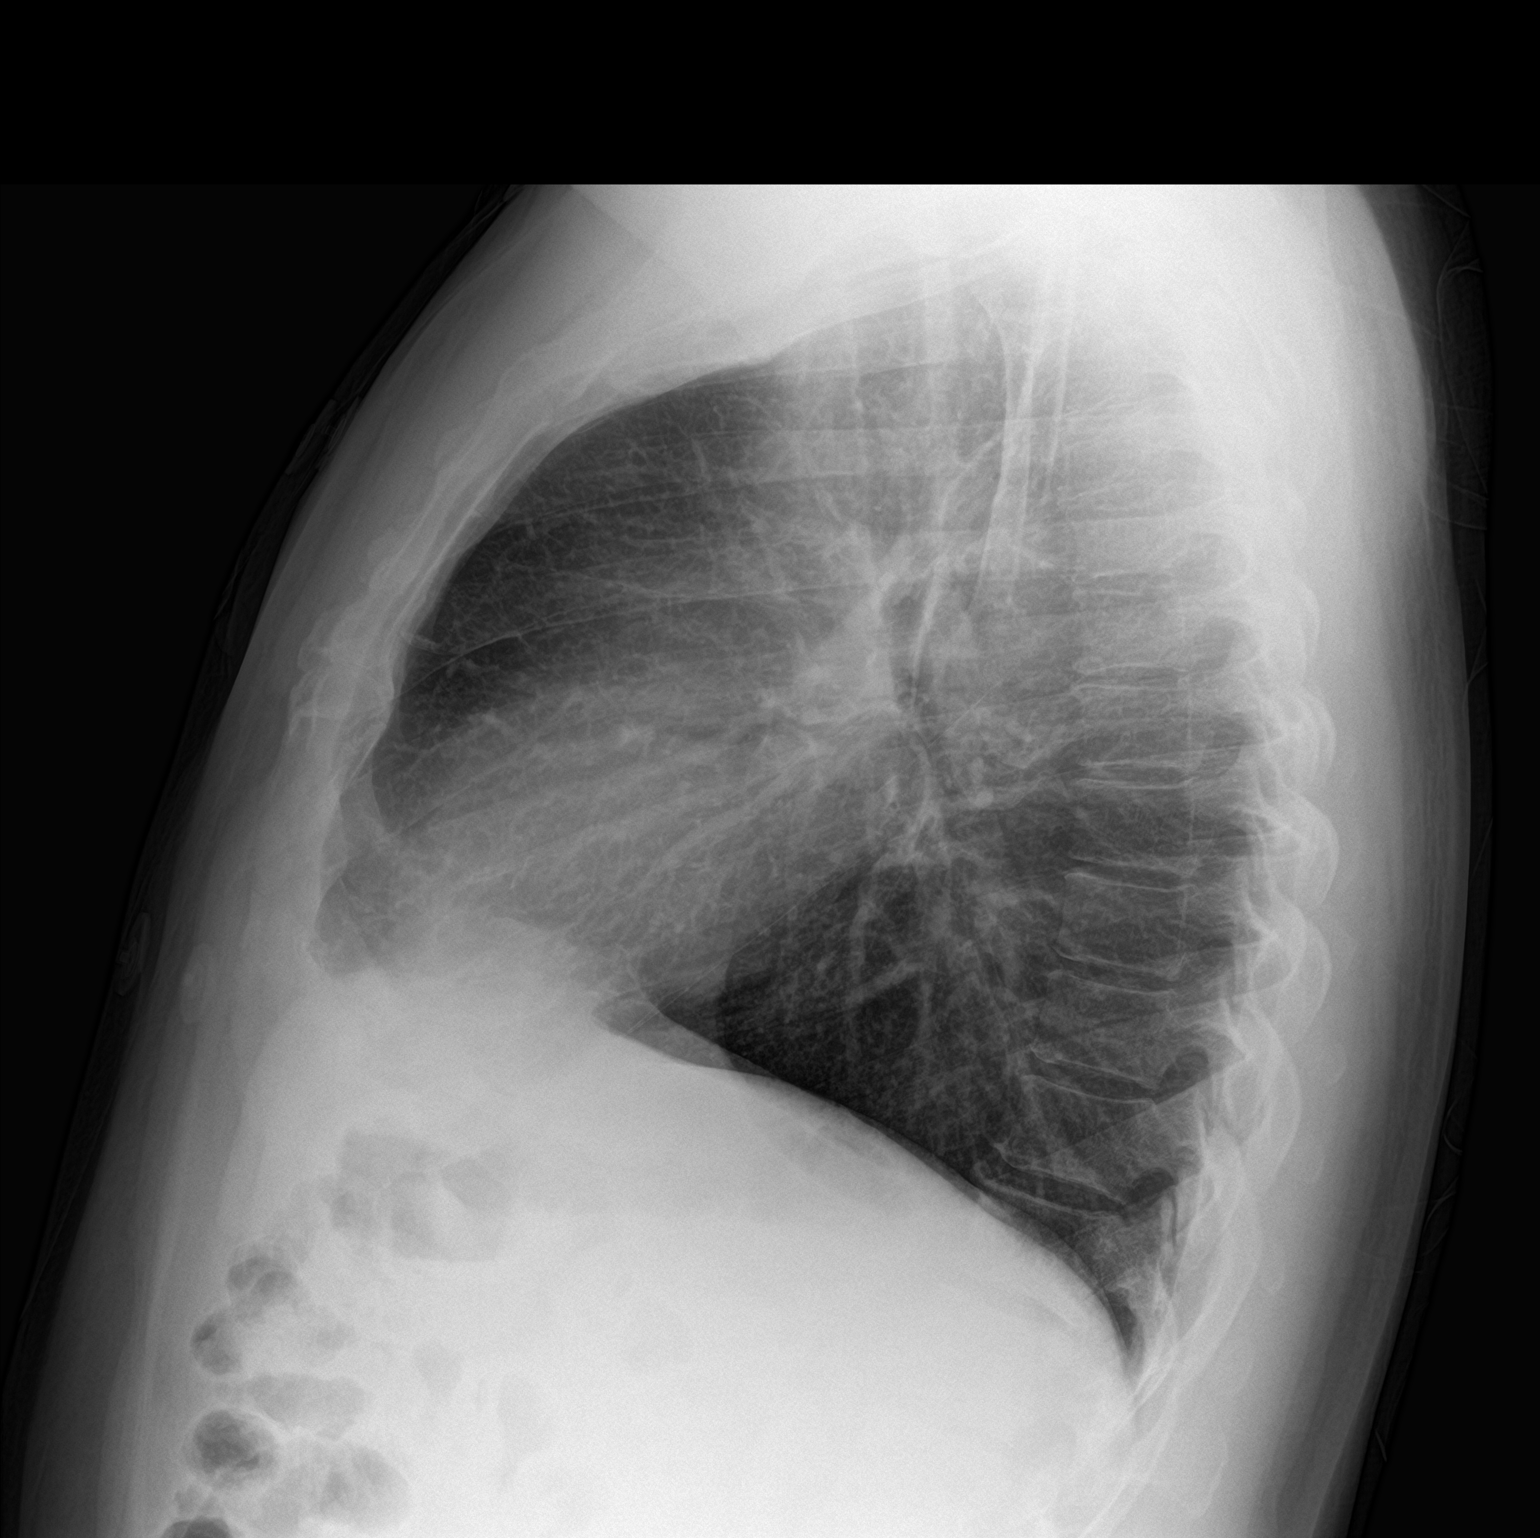

[2 of 2 positions shown; findings below may reference images not displayed]

FINDINGS: The cardiac silhouette, mediastinal and hilar contours are normal.
The lungs are clear. No pleural effusions. No pulmonary lesions. The
bony thorax is intact.
IMPRESSION: No acute cardiopulmonary findings.

## 2022-07-11 ENCOUNTER — Telehealth: Payer: Self-pay

## 2022-07-11 NOTE — Transitions of Care (Post Inpatient/ED Visit) (Signed)
Unable to reach pt by phone and could not leave v/m due to mailbox being full.    07/11/2022  Name: Calvin Butler MRN: [id number] DOB: [birth year]  Today's TOC FU Call Status: Today's TOC FU Call Status:: Unsuccessul Call (1st Attempt) Unsuccessful Call (1st Attempt) Date: 07/11/22  Attempted to reach the patient regarding the most recent Inpatient/ED visit.  Follow Up Plan: Additional outreach attempts will be made to reach the patient to complete the Transitions of Care (Post Inpatient/ED visit) call.   Signature  Lewanda Rife, LPN

## 2022-09-04 ENCOUNTER — Other Ambulatory Visit: Payer: Self-pay | Admitting: Family

## 2022-09-04 DIAGNOSIS — E119 Type 2 diabetes mellitus without complications: Secondary | ICD-10-CM

## 2022-11-29 ENCOUNTER — Other Ambulatory Visit: Payer: Self-pay | Admitting: Family

## 2022-11-29 DIAGNOSIS — E119 Type 2 diabetes mellitus without complications: Secondary | ICD-10-CM

## 2023-02-24 ENCOUNTER — Other Ambulatory Visit: Payer: Self-pay | Admitting: Family

## 2023-02-24 DIAGNOSIS — E119 Type 2 diabetes mellitus without complications: Secondary | ICD-10-CM

## 2023-07-08 ENCOUNTER — Other Ambulatory Visit: Payer: Self-pay | Admitting: Family

## 2023-07-08 DIAGNOSIS — E119 Type 2 diabetes mellitus without complications: Secondary | ICD-10-CM

## 2023-10-08 ENCOUNTER — Other Ambulatory Visit: Payer: Self-pay | Admitting: Family

## 2023-10-08 DIAGNOSIS — E119 Type 2 diabetes mellitus without complications: Secondary | ICD-10-CM
